# Patient Record
Sex: Male | Born: 1935 | Race: White | Hispanic: No | Smoking: Former smoker
Health system: Southern US, Community
[De-identification: ages and names within clinical notes are randomized; demographics above are authoritative.]

## PROBLEM LIST (undated history)

## (undated) DIAGNOSIS — R519 Headache, unspecified: Secondary | ICD-10-CM

## (undated) DIAGNOSIS — Z8673 Personal history of transient ischemic attack (TIA), and cerebral infarction without residual deficits: Secondary | ICD-10-CM

## (undated) DIAGNOSIS — F329 Major depressive disorder, single episode, unspecified: Secondary | ICD-10-CM

## (undated) DIAGNOSIS — I2510 Atherosclerotic heart disease of native coronary artery without angina pectoris: Secondary | ICD-10-CM

## (undated) DIAGNOSIS — H3530 Unspecified macular degeneration: Secondary | ICD-10-CM

## (undated) DIAGNOSIS — G309 Alzheimer's disease, unspecified: Secondary | ICD-10-CM

## (undated) DIAGNOSIS — M1990 Unspecified osteoarthritis, unspecified site: Secondary | ICD-10-CM

## (undated) DIAGNOSIS — I219 Acute myocardial infarction, unspecified: Secondary | ICD-10-CM

## (undated) DIAGNOSIS — I209 Angina pectoris, unspecified: Secondary | ICD-10-CM

## (undated) DIAGNOSIS — I672 Cerebral atherosclerosis: Secondary | ICD-10-CM

## (undated) DIAGNOSIS — N183 Chronic kidney disease, stage 3 unspecified: Secondary | ICD-10-CM

## (undated) DIAGNOSIS — R569 Unspecified convulsions: Secondary | ICD-10-CM

## (undated) DIAGNOSIS — G459 Transient cerebral ischemic attack, unspecified: Secondary | ICD-10-CM

## (undated) DIAGNOSIS — F32A Depression, unspecified: Secondary | ICD-10-CM

## (undated) DIAGNOSIS — I10 Essential (primary) hypertension: Secondary | ICD-10-CM

## (undated) DIAGNOSIS — R51 Headache: Secondary | ICD-10-CM

## (undated) DIAGNOSIS — E785 Hyperlipidemia, unspecified: Secondary | ICD-10-CM

## (undated) DIAGNOSIS — F0280 Dementia in other diseases classified elsewhere without behavioral disturbance: Secondary | ICD-10-CM

## (undated) HISTORY — PX: APPENDECTOMY: SHX54

## (undated) HISTORY — PX: CHOLECYSTECTOMY: SHX55

## (undated) HISTORY — PX: CARDIAC CATHETERIZATION: SHX172

## (undated) HISTORY — PX: CORONARY ANGIOPLASTY: SHX604

## (undated) HISTORY — DX: Unspecified osteoarthritis, unspecified site: M19.90

## (undated) HISTORY — DX: Unspecified macular degeneration: H35.30

## (undated) HISTORY — DX: Acute myocardial infarction, unspecified: I21.9

## (undated) HISTORY — DX: Hyperlipidemia, unspecified: E78.5

---

## 2001-06-19 ENCOUNTER — Inpatient Hospital Stay (HOSPITAL_COMMUNITY): Admission: EM | Admit: 2001-06-19 | Discharge: 2001-06-28 | Payer: Self-pay | Admitting: Emergency Medicine

## 2001-06-19 ENCOUNTER — Encounter: Payer: Self-pay | Admitting: Emergency Medicine

## 2001-06-21 ENCOUNTER — Encounter: Payer: Self-pay | Admitting: Emergency Medicine

## 2003-01-06 ENCOUNTER — Emergency Department (HOSPITAL_COMMUNITY): Admission: EM | Admit: 2003-01-06 | Discharge: 2003-01-06 | Payer: Self-pay | Admitting: Emergency Medicine

## 2003-07-01 ENCOUNTER — Emergency Department (HOSPITAL_COMMUNITY): Admission: EM | Admit: 2003-07-01 | Discharge: 2003-07-01 | Payer: Self-pay | Admitting: Emergency Medicine

## 2004-05-06 ENCOUNTER — Inpatient Hospital Stay (HOSPITAL_COMMUNITY): Admission: EM | Admit: 2004-05-06 | Discharge: 2004-05-11 | Payer: Self-pay | Admitting: Emergency Medicine

## 2004-05-06 ENCOUNTER — Ambulatory Visit: Payer: Self-pay | Admitting: Internal Medicine

## 2004-05-08 ENCOUNTER — Ambulatory Visit: Payer: Self-pay | Admitting: Internal Medicine

## 2004-05-08 ENCOUNTER — Ambulatory Visit: Payer: Self-pay | Admitting: *Deleted

## 2004-09-21 ENCOUNTER — Ambulatory Visit (HOSPITAL_COMMUNITY): Admission: RE | Admit: 2004-09-21 | Discharge: 2004-09-21 | Payer: Self-pay | Admitting: Family Medicine

## 2009-01-27 ENCOUNTER — Inpatient Hospital Stay (HOSPITAL_COMMUNITY): Admission: EM | Admit: 2009-01-27 | Discharge: 2009-01-28 | Payer: Self-pay | Admitting: Cardiology

## 2009-01-27 ENCOUNTER — Encounter: Payer: Self-pay | Admitting: Emergency Medicine

## 2009-01-27 ENCOUNTER — Ambulatory Visit: Payer: Self-pay | Admitting: Cardiovascular Disease

## 2009-02-16 DIAGNOSIS — I10 Essential (primary) hypertension: Secondary | ICD-10-CM

## 2009-02-16 DIAGNOSIS — I219 Acute myocardial infarction, unspecified: Secondary | ICD-10-CM | POA: Insufficient documentation

## 2009-02-16 DIAGNOSIS — M159 Polyosteoarthritis, unspecified: Secondary | ICD-10-CM

## 2009-02-16 DIAGNOSIS — I2510 Atherosclerotic heart disease of native coronary artery without angina pectoris: Secondary | ICD-10-CM

## 2009-02-16 DIAGNOSIS — E785 Hyperlipidemia, unspecified: Secondary | ICD-10-CM

## 2009-02-16 DIAGNOSIS — H3530 Unspecified macular degeneration: Secondary | ICD-10-CM

## 2009-02-28 ENCOUNTER — Encounter: Payer: Self-pay | Admitting: Cardiology

## 2009-03-01 ENCOUNTER — Ambulatory Visit: Payer: Self-pay | Admitting: Cardiology

## 2009-07-27 ENCOUNTER — Ambulatory Visit: Payer: Self-pay | Admitting: Cardiology

## 2010-05-23 NOTE — Assessment & Plan Note (Signed)
Summary: rov   Visit Type:  Follow-up Primary Provider:  Dr Ike Bene in [place]  CC:  no complaints pt played golf yesterday.  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 Her metal stent placed the cervical spine 2006. In 2010 he had Cutting Balloon and classic for in-stent restenosis. At that time he had total occlusion of a small right and diffuse LAD disease.  He is had no recent chest pain shortness breath or palpitations.  His other problems include hypertension, hyperlipidemia, and macular degeneration. He's been intolerant of statins.  He knows Dr. Myrtis Ser from when we used to be over on [facility] and will arrange for him to see Dr. Myrtis Ser in [place] in one year since be retiring at the end of the year. He and his wife live to [place] in [place] in a small town in [place].  Current Medications (verified): 1)  Lopressor 50 Mg Tabs (Metoprolol Tartrate) .... 1/2 Tablet Two Times A Day 2)  Red Yeast Rice 600 Mg Tabs (Red Yeast Rice Extract) .... Take 1 Tablet By Mouth Two Times A Day 3)  Aspirin Ec 325 Mg Tbec (Aspirin) .... Take One Tablet By Mouth Daily 4)  Garlic Oil 1000 Mg Caps (Garlic) .... Take 2 Capsules Two Times A Day 5)  Vitamin C 500 Mg  Tabs (Ascorbic Acid) .... One or 2 Tablets A Day 6)  Vitamin E 400 Unit Caps (Vitamin E) .... Take 1 Capsule By Mouth Once A Day 7)  Glucosamine 500 Mg Caps (Glucosamine Sulfate) .... Take 1 Capsule By Mouth Two Times A Day 8)  Nitrostat 0.4 Mg Subl (Nitroglycerin) .Marland Kitchen.. 1 Tablet Under Tongue At Onset of Chest Pain; You May Repeat Every 5 Minutes For Up To 3 Doses. 9)  Co Q-10 100mg   Caps (Coenzyme Q10) .Marland Kitchen.. 1 Tab  Two Times A Day  Allergies (verified): 1)  ! Lipitor (Atorvastatin) 2)  ! Zocor 3)  ! * Slow Acting Nitroglycerine  Past History:  Past Medical History: Last updated: 02/16/2009 Current Problems:  HYPERLIPIDEMIA, MIXED (ICD-272.2) HYPERTENSION  (ICD-401.9) MI (ICD-410.90) CAD (ICD-414.00) DEGENERATIVE JOINT DISEASE, GENERALIZED (ICD-715.00) MACULAR DEGENERATION (ICD-362.50)    Vital Signs:  Patient profile:   76 year old male Height:      69 inches Weight:      230 pounds BMI:     34.09 Pulse rate:   54 / minute BP sitting:   111 / 67  (left arm) Cuff size:   large  Vitals Entered By: Burnett Kanaris, CNA (July 27, 2009 3:19 PM)   Other Orders: EKG w/ Interpretation (93000)  Patient Instructions: 1)  Your physician wants you to follow-up in: 1 year with Dr. Myrtis Ser in [place].  You will receive a reminder letter in the mail two months in advance. If you don't receive a letter, please call our office to schedule the follow-up appointment.  Appended Document: rov Assessement: 1.  CAD:  Stable. Continue curent Rx 2.  HTN:  Stable.  Continue current Rx 3.  HL:  Intolerant to statins.  Continue HH diet.  BB

## 2010-07-27 LAB — CBC
HCT: 35.8 % — ABNORMAL LOW (ref 39.0–52.0)
HCT: 39.8 % (ref 39.0–52.0)
Hemoglobin: 12.8 g/dL — ABNORMAL LOW (ref 13.0–17.0)
MCHC: 33.9 g/dL (ref 30.0–36.0)
MCV: 92.5 fL (ref 78.0–100.0)
Platelets: 164 10*3/uL (ref 150–400)
RBC: 4.35 MIL/uL (ref 4.22–5.81)
RDW: 13.4 % (ref 11.5–15.5)
RDW: 13.7 % (ref 11.5–15.5)

## 2010-07-27 LAB — COMPREHENSIVE METABOLIC PANEL
ALT: 18 U/L (ref 0–53)
Alkaline Phosphatase: 48 U/L (ref 39–117)
BUN: 28 mg/dL — ABNORMAL HIGH (ref 6–23)
CO2: 33 mEq/L — ABNORMAL HIGH (ref 19–32)
Chloride: 103 mEq/L (ref 96–112)
GFR calc non Af Amer: 50 mL/min — ABNORMAL LOW (ref >60)
Glucose, Bld: 143 mg/dL — ABNORMAL HIGH (ref 70–99)
Potassium: 4.1 mEq/L (ref 3.5–5.1)
Sodium: 140 mEq/L (ref 135–145)
Total Bilirubin: 0.5 mg/dL (ref 0.3–1.2)
Total Protein: 7 g/dL (ref 6.0–8.3)

## 2010-07-27 LAB — BASIC METABOLIC PANEL
BUN: 22 mg/dL (ref 6–23)
Chloride: 102 mEq/L (ref 96–112)
Chloride: 104 mEq/L (ref 96–112)
GFR calc non Af Amer: 46 mL/min — ABNORMAL LOW (ref >60)
Glucose, Bld: 117 mg/dL — ABNORMAL HIGH (ref 70–99)
Glucose, Bld: 121 mg/dL — ABNORMAL HIGH (ref 70–99)
Potassium: 4.1 mEq/L (ref 3.5–5.1)
Potassium: 5.7 mEq/L — ABNORMAL HIGH (ref 3.5–5.1)
Sodium: 137 mEq/L (ref 135–145)

## 2010-07-27 LAB — CARDIAC PANEL(CRET KIN+CKTOT+MB+TROPI)
CK, MB: 15.9 ng/mL — ABNORMAL HIGH (ref 0.3–4.0)
Relative Index: 12.8 — ABNORMAL HIGH (ref 0.0–2.5)
Relative Index: 16.1 — ABNORMAL HIGH (ref 0.0–2.5)
Total CK: 173 U/L (ref 7–232)
Troponin I: 1.06 ng/mL (ref 0.00–0.06)
Troponin I: 1.93 ng/mL (ref 0.00–0.06)
Troponin I: 2.76 ng/mL (ref 0.00–0.06)

## 2010-07-27 LAB — PROTIME-INR: INR: 1.04 (ref 0.00–1.49)

## 2010-07-27 LAB — HEPARIN LEVEL (UNFRACTIONATED): Heparin Unfractionated: 0.62 IU/mL (ref 0.30–0.70)

## 2010-07-27 LAB — DIFFERENTIAL
Basophils Absolute: 0.1 10*3/uL (ref 0.0–0.1)
Basophils Relative: 1 % (ref 0–1)
Eosinophils Absolute: 0.5 10*3/uL (ref 0.0–0.7)
Monocytes Relative: 8 % (ref 3–12)
Neutro Abs: 4.3 10*3/uL (ref 1.7–7.7)
Neutrophils Relative %: 49 % (ref 43–77)

## 2010-07-27 LAB — LIPID PANEL
Cholesterol: 188 mg/dL (ref 0–200)
Triglycerides: 75 mg/dL (ref <150)

## 2010-07-27 LAB — POCT CARDIAC MARKERS: CKMB, poc: 1.1 ng/mL (ref 1.0–8.0)

## 2010-07-27 LAB — APTT: aPTT: 24 seconds (ref 24–37)

## 2010-08-16 ENCOUNTER — Encounter: Payer: Self-pay | Admitting: Cardiovascular Disease

## 2010-08-17 ENCOUNTER — Encounter: Payer: Self-pay | Admitting: Cardiovascular Disease

## 2010-08-17 ENCOUNTER — Ambulatory Visit (INDEPENDENT_AMBULATORY_CARE_PROVIDER_SITE_OTHER): Payer: Medicare Other | Admitting: Cardiovascular Disease

## 2010-08-17 VITALS — BP 154/90 | HR 73 | Resp 18 | Ht 69.0 in | Wt 226.1 lb

## 2010-08-17 DIAGNOSIS — I10 Essential (primary) hypertension: Secondary | ICD-10-CM

## 2010-08-17 DIAGNOSIS — I2510 Atherosclerotic heart disease of native coronary artery without angina pectoris: Secondary | ICD-10-CM

## 2010-08-17 DIAGNOSIS — E782 Mixed hyperlipidemia: Secondary | ICD-10-CM

## 2010-08-17 MED ORDER — METOPROLOL TARTRATE 50 MG PO TABS: ORAL_TABLET | ORAL | Status: DC

## 2010-08-17 NOTE — Patient Instructions (Signed)
Your physician wants you to follow-up in: 1 year  You will receive a reminder letter in the mail two months in advance. If you don't receive a letter, please call our office to schedule the follow-up appointment.  Your physician recommends that you continue on your current medications as directed. Please refer to the Current Medication list given to you today.  

## 2010-08-17 NOTE — Assessment & Plan Note (Signed)
Blood pressure is elevated today. He did not take his morning beta blocker dose. We'll continue observation and asked him to record some home blood pressures.

## 2010-08-17 NOTE — Assessment & Plan Note (Signed)
The patient is stable without angina. We'll continue his current medical program. Note that his blood pressure is elevated today but he did not take his morning dose of Lopressor.

## 2010-08-17 NOTE — Progress Notes (Signed)
HPI:  This is a 77 year old gentleman with coronary artery disease presented for follow up evaluation. He has been followed by Dr. Dickie La and is establishing with me and Dr. Marian Sorrow retirement. The patient last had Cutting Balloon angioplasty performed for in-stent restenosis in 2010. At that time he presented with unstable angina. He has had no further ischemic symptoms since then. His physical activity is limited by low back problems and arthritis.  He denies chest pain, dyspnea, orthopnea, PND, or edema.  Outpatient Encounter Prescriptions as of 08/17/2010  Medication Sig Dispense Refill  . Ascorbic Acid (VITAMIN C) 500 MG tablet Take 500 mg by mouth daily.        Marland Kitchen aspirin 81 MG tablet Take 81 mg by mouth daily.        . Coenzyme Q10 (COQ10) 100 MG CAPS Take 1 capsule by mouth 2 (two) times daily.        . metoprolol (LOPRESSOR) 50 MG tablet 1/2 tab po bid       . nitroGLYCERIN (NITROSTAT) 0.4 MG SL tablet Place 0.4 mg under the tongue every 5 (five) minutes as needed.        . Red Yeast Rice 600 MG CAPS Take 1 capsule by mouth 2 (two) times daily.        . vitamin E 400 UNIT capsule Take 400 Units by mouth daily.        Marland Kitchen DISCONTD: aspirin 325 MG tablet Take 325 mg by mouth daily.          Allergies  Allergen Reactions  . Atorvastatin   . Simvastatin     Past Medical History  Diagnosis Date  . Hyperlipidemia   . Myocardial infarction   . CAD (coronary artery disease)   . DJD (degenerative joint disease)   . Macular degeneration     ROS: Negative except as per HPI  BP 154/90  Pulse 73  Resp 18  Ht 5\' 9"  (1.753 m)  Wt 226 lb 1.9 oz (102.567 kg)  BMI 33.39 kg/m2  PHYSICAL EXAM: Pt is alert and oriented, elderly, obese gentleman in NAD HEENT: normal Neck: JVP - normal, carotids 2+= without bruits Lungs: CTA bilaterally CV: RRR without murmur or gallop Abd: soft, NT, Positive BS, no hepatomegaly Ext: no C/C/E, distal pulses intact and equal Skin: warm/dry no rash  EKG:   Normal sinus rhythm with sinus arrhythmia, first degree AV block, otherwise within normal limits.  ASSESSMENT AND PLAN:

## 2010-08-17 NOTE — Assessment & Plan Note (Signed)
The patient is statin intolerant he has tried multiple agents. He will continue with red yeast rice.

## 2010-09-08 NOTE — Cardiovascular Report (Signed)
[place]. [facility]  Patient:    David Collier, David Collier Visit Number: [id number] MRN: [id number]          Service Type: MED Location: CCUB 2908 01 Attending Physician:  Lenoria Farrier Dictated by:   Rollene Rotunda, M.D. [facility] Proc. Date: 06/20/01 Admit Date:  06/20/2001   CC:         Bruce R. Juanda Chance, M.D. [facility]   Cardiac Catheterization  PRIMARY PHYSICIAN: None.  CARDIOLOGISTEverardo Beals Juanda Chance, M.D.  INDICATIONS: Evaluate patient with unstable angina.  DESCRIPTION OF PROCEDURE: Left heart catheterization was performed via the right femoral artery.  The artery was cannulated using an anterior wall puncture.  A #6 French arterial sheath was inserted via the modified Seldinger technique.  Preformed Judkins and a pigtail catheter were utilized.  The patient tolerated the procedure well and left the lab in stable condition.  RESULTS:  HEMODYNAMICS: LV 139/33, AO 143/87.  CORONARY ARTERIOGRAPHY: Left main: The left main coronary artery was normal.  Left anterior descending: The LAD had a long proximal 60% stenosis involving septal perforators. There were diffuse luminal irregularities throughout the remainder of this vessel. The first septal perforator had an ostial 90% stenosis. The second septal perforator had an ostial 90%.  Circumflex: The circumflex was a dominant vessel. In the AV groove there were diffuse luminal irregularities. There was a distal, long 80% stenosis before posterolateral. It was occluded after posterolateral. There appeared to be LAD to left collaterals. There was a large mid obtuse marginal off the circumflex which had a proximal 40% stenosis followed by a long mid 70% stenosis, followed by a more focal 90% stenosis in the mid segment.  Right coronary artery: The right coronary artery appeared to be nondominant and it was occluded proximally and recanalization with bridging collaterals.  LEFT VENTRICULOGRAM: A left  ventriculogram was obtained in the RAO projection.  The EF was 65% with normal wall motion.  PLAN: I reviewed the films with Drs. Pulsipher and Brodie. We plan to suggest PCI per Dr. Chales Abrahams on Monday with possible stenting and angioplasty of the mid obtuse marginal lesion. Dictated by:   Rollene Rotunda, M.D. [facility] Attending Physician:  Lenoria Farrier DD:  06/20/01 TD:  06/23/01 Job: [id number]

## 2010-09-08 NOTE — Cardiovascular Report (Signed)
[place]. [facility]  Patient:    David Collier, David Collier Visit Number: [id number] MRN: [id number]          Service Type: MED Location: CCUB 2908 01 Attending Physician:  Lenoria Farrier Dictated by:   Everardo Beals Juanda Chance, M.D. [facility] Proc. Date: 06/26/01 Admit Date:  06/20/2001   CC:         Luis Abed, M.D. [facility]  Cardiopulmonary Laboratory   Cardiac Catheterization  PROCEDURES PERFORMED: Percutaneous coronary intervention.  CLINICAL HISTORY: The patient is 77 years old and had remote PTCAs in the 22s. He is admitted with prolonged chest pain and enzymes consistent with a non-Q-wave infarction. He developed nausea and vomiting and a Mallory-Weiss tear, and we had to postpone his intervention until today. We plan to use Angiomax to decrease the bleeding risks.  DESCRIPTION OF PROCEDURE: The procedure was performed via the left femoral artery using an arterial sheath and 6 French preformed coronary catheters.  A front wall arterial puncture was performed and Omnipaque contrast was used. We used a 7 [place] 3.5 Voda guiding catheter with side holes and a short luge wire. The patient was given Angiomax bolus and infusion which prolonged his ACT to greater than 300 seconds. We were able to navigate the wire down the circumflex marginal vessel without difficulty. We first dilated with a 2.5 x 30 mm Maverick performing four inflations up to 8 atmospheres for 28 seconds. We were hoping initially to get by with a short stent, but we got suboptimal results and was felt we had to stent the entire length of the lesion, so we first went in with a 2.5 x 24 mm Express and deployed this in the distal lesion in the circumflex marginal vessel with two inflations up to 12 atmospheres for 23 seconds.  We then deployed at second 2.6 x 16 mm Express stent overlapping the first stent and deploying this with two inflations of 12 and 15 atmospheres for 35 and 23 seconds.  We  then approached the distal circumflex artery which fed a posterior descending branch. We initially pre-dilated this with the 2.5 Express performing two inflations of 4 and 5 atmospheres for 42 and 24 seconds. We then deployed at 2.25 x 20 mm Express with three inflations of 8, 14 and 16 atmospheres for 30 seconds each. Repeat diagnostic studies were then performed through the guiding catheter. The patient tolerated the procedure well and left the laboratory in satisfactory condition. RESULTS: Initially, the marginal branch had segmental disease in its mid to distal portion with two focal areas of 80 and 95% stenosis. With stenting, the 95% stenosis improved to 0% and with the second overlapping stent the 80% stenosis improved to 0%. There was residual 40% disease proximal to the two overlapping stents.  The lesion in the circumflex artery was initially 80% and following stenting this improved to 0%.  CONCLUSIONS: 1. Successful stenting of tandem lesions in the circumflex marginal vessel    with improvement in percent diameter narrowing with tandem overlying    stents in the distal lesion from 95% to 0% and in the mid lesion    from 80% to 0%. 2. Successful stenting of the distal circumflex artery with improvement in    percent diameter narrowing from 80% to 0%.  DISPOSITION: The patient was returned to the postangioplasty unit for further observation. Because of he will be at high risk for re-stenosis we will start him on Foltx. Dictated by:   Everardo Beals Juanda Chance,  M.D. [facility] Attending Physician:  Lenoria Farrier DD:  06/26/01 TD:  06/27/01 Job: [id number]

## 2010-09-08 NOTE — Discharge Summary (Signed)
[place]. [facility]  Patient:    David Collier, David Collier Visit Number: [id number] MRN: [id number]          Service Type: MED Location: 2000 2001 01 Attending Physician:  Lenoria Farrier Dictated by:   Guy Franco, P.A. Admit Date:  06/20/2001 Discharge Date: 06/28/2001                    Referring Physician Discharge [place]  DATE OF BIRTH:  27-Jan-1929  DISCHARGE DIAGNOSES: 1. Non-Q-wave myocardial infarction status post stent placement to the    circumflex-obtuse marginal x2, distal circumflex x1, on June 26, 2001 by    Dr. Charlies Constable. 2. Upper gastrointestinal bleed with Clayborne Artist tear, treated. 3. Leukocytosis, resolved. 4. Hypertension, treated.  HOSPITAL COURSE:  David Collier is a 77 year old male patient who was transferred to [facility] on June 20, 2001 for a cardiac workup.  He presented to [facility] with acute coronary syndrome and was transferred here for cardiac catheterization.  He does have known coronary artery disease and underwent a prior PCI in the 1980s (site unknown).  His cardiac isoenzymes did peak at CK of 278, MB fraction 56.2, and troponin at 5.08.  Other lab studies showed hemoglobin 9.4, hematocrit 27.8, platelets 228.  Sodium 138, potassium 4.3, BUN 17, creatinine 1.4.  The patient was then taken to cardiac catheterization laboratory.  He was found to have normal left main, LAD with a long proximal 60% lesion, septal one with a 90% ostial, septal two with a 90% ostial.  The patient was circumflex dominant with a distal long 80% lesion before the PLA.  This was occluded just after the PL.  Obtuse marginal was a large vessel with a mid 70% long lesion followed by a focal 90% stenosis.  RCA nondominant with a proximal 100% lesion with bridging collaterals.  Eventually, the patient did undergo a PCI/stent placement to the circumflex/OM for 100% to a 0% stenosis.  The patient then underwent a PCI of the  circumflex (distal lesion) from an 80% to a 0% stenosis post procedure.  The patient remained stable overnight and was eventually discharged to home on June 28, 2001.  Other issues during his hospitalization include guaiac positive stool with some anemia.  The GI service did see the patient and the patient underwent an EGD which revealed a Mallory Weiss tear which was felt to be secondary to the patients nausea, vomiting, and treatment with anticoagulants.  Dr. Russella Dar performed the procedure and felt that the patient should recover within 10 days.  Otherwise, the patient did remain stable.  His hemoglobins did stabilize prior to his discharge.  DISCHARGE MEDICATIONS:  He was discharged home on the following medications: 1. Lipitor 10 mg one p.o. q.d. 2. Lopressor 50 mg one p.o. b.i.d. 3. Plavix 75 mg one p.o. q.d. 4. Enteric-coated aspirin 325 mg one p.o. q.d. 5. Foltx one p.o. q.d. 6. Protonix 40 mg one p.o. b.i.d. 7. Sublingual nitroglycerin p.r.n. chest pain.  ACTIVITY:   No strenuous activity.  DIET:  Low fat diet.  WOUND CARE:  Clean over catheterization site with soap and water.  FOLLOW-UP:  Call for any concerns or questions.  Follow up with Dr. Dietrich Pates in [place] in two weeks.  The office will call with an appointment.  At that time the patient will need a CBC to ensure that his hemoglobin is trending upward. Dictated by:   Guy Franco, P.A. Attending Physician:  Charlies Constable  Aundria Rud DD:  07/09/01 TD:  07/09/01 Job: [id number]

## 2010-09-08 NOTE — H&P (Signed)
[facility]  Patient:    David Collier, David Collier Visit Number: [id number] MRN: [id number]          Service Type: MED Location: ICCU [id number] Attending Physician:  Hilario Quarry Dictated by:   Colette Ribas, M.D. Admit Date:  06/19/2001                           History and Physical  ADMITTING PHYSICIAN:  Colette Ribas, M.D.  ATTENDING PHYSICIAN:  Colette Ribas, M.D.  PRIMARY PHYSICIAN:  [facility]  PRIMARY CARDIOLOGIST:  Valera Castle, M.D.  HISTORY OF PRESENT ILLNESS:  A 77 year old gentleman with known coronary artery disease who has been noncompliant with medications who has not had a cardiac evaluation in 12 years who presented with increasing left-sided chest pain.  He has had it for the last few weeks and then the day of admission he had left-sided chest pain 8/10 which was quite heavy, radiating to the shoulder.  Some nausea and mild shortness of breath.  No diaphoresis.  Came to the emergency department with near complete resolution of pain with two nitroglycerin.  Was placed on nitroglycerin drip with resolution of pain.  No respiratory complaints.  The patient did have angioplasty around 10-12 years ago, he states.  He had quit taking his antihypertensives except vitamin E and garlic as this is what he had wanted to do.  ECG initially showed diffuse ST abnormalities with depression in the pericordial leads which resolved with nitroglycerin drip as the patient became chest pain free.  PAST MEDICAL HISTORY:  Coronary artery disease, hypertension.  PAST SURGICAL HISTORY:  Appendectomy.  MEDICATIONS:  Vitamin E and nothing else.  ALLERGIES:  SORBITOL.  PHYSICAL EXAMINATION  VITAL SIGNS:  Temperature 97.8, O2 saturation 98% room air, pulse 90, respirations 22.  Blood pressure initially when he came to the ER was 218/102 which normalized quite well with the nitroglycerin drip.  GENERAL:  When I saw the patient he was  pleasant, talkative, in no acute distress.  HEENT:  Normocephalic, atraumatic.  Pupils are equal, round, and reactive to light.  Extraocular movements are intact.  Naso and oropharynx with moist mucous membranes.  NECK:  No JVD.  CHEST:  Clear to auscultation bilaterally.  CARDIAC:  Regular rate and rhythm.  Normal S1, S2.  No murmurs, gallops, rubs.  ABDOMEN:  Bowel sounds positive, soft, nontender.  EXTREMITIES:  No cyanosis, clubbing, erythema, edema.  LABORATORIES:  CPK 93, CK-MB 2.7, troponin 0.19.  Sodium 140, potassium 4.3, BUN 14, creatinine 1.2.  The rest of the chem-12 is normal.  WBC 9.4, hematocrit 41.7, platelets 249,000.  PTT 23, PT 14.4.  ECG as stated above.  Chest x-ray reportedly negative per the emergency department physician.  ASSESSMENT:  A 77 year old gentleman with unstable angina pectoris off of all medications along with a history of hypertension who was to be admitted at [facility] but there were no beds available.  Dr. Daleen Squibb was consulted and it was decided to keep the patient at [facility] overnight until a bed became available.  PLAN:  Admit to [facility] in ICU overnight.  Check CPKs, troponins, MB fractions x3.  Continue nitroglycerin drip.  Continue aspirin q.d.  When patient is stable and bed available, will transfer.  ADDENDUM:  Patient was nearly chest pain free in the morning.  One of the only symptomatology was headache which seemed to improve after morphine.  We discussed obtaining  a head CT due to the anticoagulation but the head CT scanner was down here at [facility].  This was discussed with the cardiologist at [facility] and therefore we will hasten the transfer to [facility].  Will continue rule out protocol there. Dictated by:   Colette Ribas, M.D. Attending Physician:  Hilario Quarry DD:  06/20/01 TD:  06/20/01 Job: [id number]

## 2010-09-08 NOTE — Op Note (Signed)
David Collier, David Collier              ACCOUNT NO.:  [id number]   MEDICAL RECORD NO.:  [id number]          PATIENT TYPE:  INP   LOCATION:  A216                          FACILITY:  [facility]   PHYSICIAN:  Dalia Heading, M.D.  DATE OF BIRTH:  07-25-1928   DATE OF PROCEDURE:  05/10/2004  DATE OF DISCHARGE:                                 OPERATIVE REPORT   PREOPERATIVE DIAGNOSIS:  Cholecystitis, cholelithiasis.   POSTOPERATIVE DIAGNOSIS:  Cholecystitis, cholelithiasis.   PROCEDURE:  Laparoscopic cholecystectomy.   SURGEON:  Dr. Franky Macho.   ASSISTANT:  Bernerd Limbo. Leona Carry, M.D.   ANESTHESIA:  General endotracheal.   INDICATIONS:  The patient is a 77 year old white male presented with  cholecystitis secondary cholelithiasis. Preoperative cardiac workup  including a stress test was negative. The patient comes to the operating for  laparoscopic cholecystectomy.  The risks and benefits of the procedure  including bleeding, infection, hepatobiliary injury, and the possibility of  an open procedure were fully explained to the patient who gave informed  consent.   PROCEDURE NOTE:  The patient was placed in supine position. After induction  of general endotracheal anesthesia, the abdomen was prepped and draped using  the usual sterile technique with Betadine. Surgical site confirmation was  performed.   A supraumbilical incision was made down to the fascia. Veress needle was  introduced into the abdominal cavity and confirmation of placement was done  using the saline drop test. The abdomen was then insufflated to 16 mmHg  pressure. An 11 mm trocar was introduced into the abdominal cavity under  direct visualization without difficulty. The patient was placed in reversed  Trendelenburg position. An additional 11-mm trocar was placed epigastric  region and 5 mm trocars placed in the right upper quadrant and right flank  regions. The liver was inspected, noted to be within normal limits. The  gallbladder was retracted superior and laterally.  The dissection was begun  around the infundibulum of the gallbladder. The cystic duct was first  identified. Its juncture to the infundibulum fully identified.  Endoclips  were placed proximally and distally on the cystic duct and cystic duct was  divided. This was likewise done to the cystic artery. The gallbladder is  then freed away from the gallbladder fossa using Bovie electrocautery. The  gallbladder delivered through the epigastric trocar site using EndoCatch  bag.  The gallbladder fossa was inspected.  Any bleeding was controlled  using Bovie electrocautery. Surgicel was placed in the gallbladder fossa.  All fluid nerve and evacuated from the abdominal cavity prior to removal of  the trocars.   All wounds were irrigated with normal saline. All wounds were injected with  0.5% Sensorcaine. The supraumbilical fascia as well as epigastric fascia  reapproximated using a 0 Vicryl interrupted suture. All skin incisions were  closed using staples. Betadine ointment, dry sterile dressings were applied.   All tape and needle counts were correct at the end of the procedure. The  patient was extubated in the upper room was went back to recovery room awake  in stable condition.   COMPLICATIONS:  None.  SPECIMEN:  Gallbladder with stones.   BLOOD LOSS:  MinimalLoraine Leriche   MAJ/MEDQ  D:  05/10/2004  T:  05/10/2004  Job:  [id number]   cc:   Patrica Duel, M.D.  [address], [address]  [place]  [place] [address]  Fax: [phone]

## 2010-09-08 NOTE — H&P (Signed)
David Collier, David Collier              ACCOUNT NO.:  [id number]   MEDICAL RECORD NO.:  [id number]          PATIENT TYPE:  INP   LOCATION:  A302                          FACILITY:  [facility]   PHYSICIAN:  Patrica Duel, M.D.    DATE OF BIRTH:  [birth year]   DATE OF ADMISSION:  05/06/2004  DATE OF DISCHARGE:  LH                                HISTORY & PHYSICAL   CHIEF COMPLAINT:  Abdominal pain.   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  He has undergone stenting on two occasions, most recently in 2003 by Dr.  Antoine Poche.  He has done very well from a cardiac standpoint and has had no  recurrent symptoms suggestive of ischemia.   The patient presented to the emergency department with the relatively sudden  onset of epigastric abdominal pain and bloating.  The pain was quite severe  (9/10) and intermittent in nature.  He had no nausea and vomiting, diarrhea,  melena, hematemesis, or hematochezia.  He was administered enemas x2 by his  wife, which were productive but offered no relief of his abdominal pain.   The patient presented to the emergency department.  Cardiac enzymes and an  EKG are benign.  A CT of the abdomen and pelvis is essentially unrevealing.  Lab review revealed a white count of 12,000 with a left shift.  Hemoglobin  and hematocrit are stable.  Electrolytes normal.  Currently liver functions  are pending.   There is no history of headache, neurologic deficits, chest pain, or  dyspnea, syncope, palpitations, or diaphoresis.  There is also no history of  genitourinary symptoms.   The patient is admitted with abdominal pain of questionable etiology.   CURRENT MEDICATIONS:  Include:  1.  Lopressor 50 b.i.d.  2.  Aspirin 325 daily.   ALLERGIES:  SORBITRATE.   PAST MEDICAL HISTORY:  As noted above.   SOCIAL HISTORY:  He is a nondrinker and nonsmoker.  He has a  supportive  family.   REVIEW OF SYSTEMS:  Negative except as mentioned.   FAMILY HISTORY:  Noncontributory.   PHYSICAL EXAMINATION:  GENERAL:  A very pleasant fully alert male who is  alert and oriented in no acute distress at this time.  VITAL SIGNS:  Temperature 97.6, BP 142/70, heart rate is 60 and regular,  respirations 20-22 and unlabored.  HEENT:  Normocephalic atraumatic.  Pupils are equal.  There is no scleral  icterus.  Ears, nose, and throat are benign.  NECK:  Supple.  There are no masses, thyromegaly, bruits, or  lymphadenopathy.  LUNGS:  Clear.  HEART:  Sounds are somewhat distant but no apparent murmurs, rubs, or  gallops noted.  ABDOMEN:  Mildly distended and hypotympanic.  Bowel sounds are diminished  but present.  There is mild-to-moderate tenderness in the epigastrium only.  Right upper quadrant is clear.  Murphy's sign is negative.  There is no  hepatomegaly, splenomegaly, or other masses noted.  RECTAL:  Benign.  Prostate normal.  Heme-negative stool.  No masses.  GENITALIA:  Normal.  No hernia apparent.  EXTREMITIES:  Femoral pulses are normal.  No cyanosis, clubbing, or edema.  Peripheral pulses are perfectly normal.  NEUROLOGIC:  Without focal deficits.   LABORATORIES:  Pertinent labs as noted above.   ASSESSMENT:  Abdominal pain.  No clear etiology is apparent at this time.  We need to consider obstruction, ischemic bowel, atypical gallbladder  disease (liver function tests pending), gastritis, peptic ulcer disease,  ileus, or other.   PLAN:  Admit to 2A for monitoring.  Administer Protonix and Cipro  empirically.  Pain control with morphine sulfate.  We will obtain a  gastroenterology consult.  Possibly an ultrasound hepatobiliary scan pending  the LFTs.  We will follow and treat expectantly.      MC/MEDQ  D:  05/06/2004  T:  05/06/2004  Job:  [id number]

## 2010-09-08 NOTE — Cardiovascular Report (Signed)
[place]. [facility]  Patient:    David Collier, David Collier Visit Number: [id number] MRN: [id number]          Service Type: MED Location: CCUB [id number] [id number] Attending Physician:  Lenoria Farrier Dictated by:   Rollene Rotunda, M.D. [facility] Proc. Date: 06/20/01 Admit Date:  06/20/2001                          Cardiac Catheterization  INCOMPLETE  1990.  At that time, he had normal left main.  The LAD had no significant obstruction.  The distal circumflex had an 80% stenosis.  The circumflex marginal had a 90% obstruction at the site of a previous angioplasty.  This was apparently treated with angioplasty again.  The right coronary artery was obstructed after the SA nodal branch.  The EF was said to be within normal limits with no segment wall motion abnormalities.  He said he had done well since that time until approximately two weeks ago when he started having anterior chest burning  This was a discomfort that would occur with activity.  He started noticing it with things such as golfing.  He has had more of this recently where he has been walking up a slight incline and would get discomfort that now radiates to his neck or into his arms.  He is now having chest discomfort at rest.  He was admitted to [facility] yesterday where he did have a small troponin bump (0.19, 0.16).  He has been treated with heparin, nitroglycerin, and aspirin.  He is now admitted in transfer.             y Dictated by:   Rollene Rotunda, M.D. [facility] Attending Physician:  Lenoria Farrier DD:  06/20/01 TD:  06/21/01 Job: [id number]

## 2010-09-08 NOTE — Discharge Summary (Signed)
David Collier, David Collier              ACCOUNT NO.:  [id number]   MEDICAL RECORD NO.:  [id number]          PATIENT TYPE:  INP   LOCATION:  A216                          FACILITY:  [facility]   PHYSICIAN:  Dalia Heading, M.D.  DATE OF BIRTH:  09/06/1928   DATE OF ADMISSION:  05/06/2004  DATE OF DISCHARGE:  01/19/2006LH                                 DISCHARGE SUMMARY   AGE:  77 years old.   HOSPITAL COURSE/SUMMARY:  Patient is a 77 year old white male who presented  to [facility] with upper abdominal pain.  He was admitted to the  hospital for further evaluation and treatment.  Gastroenterology was  consulted, and the patient was felt to have cholecystitis secondary to  cholelithiasis.  A surgery consultation was obtained as well as cardiology.  The patient ruled out for myocardial infarction.  The patient had a stress  test, which was unremarkable.  The patient subsequently underwent a  laparoscopic cholecystectomy on May 10, 2004.  He tolerated the  procedure well.  His postoperative course was for the most part  unremarkable.  He did have some mild wheezing, but a chest x-ray  postoperatively revealed stable cardiomegaly without evidence of fluid  overload.  The patient is being discharged home on postoperative day #1 in  good, improving condition.   DISCHARGE INSTRUCTIONS:  Patient is to follow up with Dr. Franky Macho on  May 18, 2004.   DISCHARGE MEDICATIONS:  1.  Vicodin 1-2 tablets p.o. q.4h. p.r.n. pain.  2.  Lopressor 12.5 mg p.o. b.i.d.  3.  Aspirin 325 mg p.o. q.d.   PRINCIPLE DIAGNOSES:  1.  Acute cholecystitis/cholelithiasis.  2.  Coronary artery disease.  3.  Hypertension.   PRINCIPLE PROCEDURE:  Laparoscopic cholecystectomy by Dr. Franky Macho on  May 10, 2004.     Mark   MAJ/MEDQ  D:  05/11/2004  T:  05/11/2004  Job:  [id number]   cc:   Patrica Duel, M.D.  [address], [address]  [place]  [place] [address]  Fax: [phone]   Vida Roller, M.D.  Fax: [phone]

## 2010-09-08 NOTE — Consult Note (Signed)
[name], [name]              ACCOUNT NO.:  [id number]   MEDICAL RECORD NO.:  [id number]          PATIENT TYPE:  INP   LOCATION:  A216                          FACILITY:  [facility]   PHYSICIAN:  Lionel December, M.D.    DATE OF BIRTH:  01-19-1929   DATE OF CONSULTATION:  DATE OF DISCHARGE:                                   CONSULTATION   REASON FOR CONSULTATION:  Epigastric pain.   HISTORY OF PRESENT ILLNESS:  David Collier is a 77 year old Caucasian male who was  admitted to Dr. Geanie Logan service yesterday morning via emergency room with  acute onset of epigastric pain.   Armani Brar states that he was in his usual state of health the night  before.  He had his usual meal and bedtime snack.  He woke up around 2 a.m.  yesterday morning.  He felt like he had gas trapped in his epigastric area.  This symptom became more intense and two hours later he was having pain and  came to the emergency room.  He was evaluated by Dr. Colon Branch.  He had routine  lab studies.  His WBC was 12.1.  He had CT angio of the abdomen which  revealed some erythematous changes to aorta, but no significant  abnormalities in the vasculature, but did have cholelithiasis.  He was  admitted, and began IV Cipro.  He came down later in the day for  abdominopelvic CT with  IV contrast.  It once again showed cholelithiasis  and distended gallbladder, but no other abnormalities.   Haruo Stepanek is now pain-free.  There is no history of similar episodes in  the past.  He denies frequent heartburn, dysphagia, melena, or rectal  bleeding.  He has had a good appetite, and has not lost any weight.  He  tells me that last March, he had a virus which lasted for a month.  He lost  several pounds, but slowly he has gained it back.  He denies chest pain or  dyspnea.  He has never had a colonoscopy in the past.  Three years ago,  while he was at [facility], he had coffee-ground emesis.  He had an  EGD which showed a  Mallory-Weiss tear according to his description.   He is presently on Cipro 4 mg IV q.12h., morphine sulfate 4 mg IV q.1,  Protonix 40 mg IV q.24h.   His usual medications at home include Lopressor 25 mg b.i.d., and baby ASA  b.i.d.   PAST MEDICAL HISTORY:  He has coronary artery disease.  He has had an MI in  the past.  He has had three coronary artery stents.  Lately, he has not had  any problems.  He has hypertension.   He has had appendectomy, tonsillectomy, circumcision, and varicose vein  stripping from his right lower extremity.   ALLERGIES:  NITRATES, makes him very sick.   FAMILY HISTORY:  Noncontributory.  Both parents lived into their 75s or 90s.  He has six siblings.  Two brothers have coronary artery disease, and have  undergone stenting.   SOCIAL HISTORY:  He  is married.  He is semi-retired.  He is a Programmer, multimedia.  He  has not smoked nor drank alcohol in several years.   PHYSICAL EXAMINATION:  A pleasant, well-developed, mildly obese, Caucasian  male who is in no acute distress.  His estimated weight is around 235  (according the patient).  He has not been weighed yet.  He is 70 inches  tall.  Pulse 90 per minute.  Blood pressure 125/62, temperature is 98.6,  earlier it was 100.1, and respirations 18.  Conjunctivae are pink.  Sclerae  are non-icteric.  Oropharyngeal mucosa is normal.  Neck without masses or  thyromegaly.  Cardiac exam is a regular rhythm.  Normal S1 and S2.  No  murmur or gallop noted.  Lungs are clear to auscultation.  Abdomen is  protuberant.  Bowel sounds are normal.  On palpation, is soft.  He has mild  tenderness below right costal margin with guarding.  The rectal exam is  deferred.  No peripheral edema or clubbing noted.   Abdominal CT films are reviewed.  He has clear stones in his gallbladder  which is distended.  CBD is not dilated.   LABORATORY DATA:  From today:  WBC 16.1.  Labs from yesterday evening:  WBC  16.1, H&H 13.7 and 40.6,  platelet count 224 k.  He had 77 neutrophils and 11  bands.  His glucose on admission was 134, bilirubin 0.6, AP 48, AST 28, ALT  18, total protein 7.2 with an albumin of 3.7, amylase was 77, lipase 35,  troponin level x 3 has been less than 0.05.   ASSESSMENT:  David Collier is a 77 year old Caucasian male who presents with acute  epigastric pain.  Workup reveals a distended gallbladder and cholelithiasis.  He does have leukocytosis and had low-grade fever earlier today.  His  symptoms are felt to be typical of acute cholecystitis or intermittent  cystic duct obstruction.  There is nothing to suggest that he has peptic  ulcer disease.   RECOMMENDATIONS:  Surgical consultation for laparoscopic chole.  If his LFTs  which are pending are elevated, he should have intraoperative cholangiogram.   I also recommended screening colonoscopy at a later date.  If he is  interested, he will let us know.   I have discussed with Dr. Nobie Putnam.  Dr. Lovell Sheehan will be consulted.   We would like to thank Dr. Nobie Putnam for the opportunity to participate in  the care of this gentleman.     Naje   NR/MEDQ  D:  05/07/2004  T:  05/07/2004  Job:  [id number]

## 2010-09-08 NOTE — Consult Note (Signed)
[name], Collier              ACCOUNT NO.:  [id number]   MEDICAL RECORD NO.:  [id number]          PATIENT TYPE:  INP   LOCATION:  A216                          FACILITY:  [facility]   PHYSICIAN:  Vida Roller, M.D.   DATE OF BIRTH:  May 05, 1928   DATE OF CONSULTATION:  05/08/2004  DATE OF DISCHARGE:                                   CONSULTATION   PRIMARY CARE PHYSICIAN:  Dr. Nobie Putnam.   CARDIOLOGIST:  Dr. [place] Bing   HISTORY OF PRESENT ILLNESS:  [name] is a 77 year old man, who has  coronary artery disease, status post percutaneous revascularization of his  circumflex coronary artery in 2003.  He presents to the hospital with  abdominal pain and was found to have cholelithiasis, and the recommendation  was for a cholecystectomy.  The plan was to do his surgery today; however,  last night he developed an episode of chest discomfort which was pretty  consistent with his previous angina.  No shortness of breath, no nausea, no  diaphoresis.  It was relieved with one 4 mg IV dose of morphine.  He has not  had any chest discomfort since his percutaneous revascularization.  Interestingly, he was previously on Lopressor as an outpatient and here in  the hospital, he is not on Lopressor.  Appears to be a Lopressor withdrawal  issue.  He is currently pain free now, denies any PND or orthopnea, is  actually a relatively vigorous guy since his revascularization.   PAST MEDICAL HISTORY:  1.  Coronary disease.  He had an angioplasty back in the 1980s.  Was studies      in February 2003, found to have a 60% proximal LAD with a first septal      perforator that was significantly diseased and a second septal      perforator that was also significantly diseased.  He has an 80%      circumflex lesion which was the target for the revascularization.  He      has branch vessel disease in the branches of the circumflex.  This is a      dominant vessel, and the posterolateral branches appear  to be diseased      as well, although these were not treated, and his right coronary artery      is nondominant and appears to have also relatively significant disease      with collateralization from the left circumflex.  He has hypertension,      hyperlipidemia.  He was previously treated with a statin agent but had      some myalgias and has refused to take any further medications.  His      medications prior to admission were Lopressor 50 mg b.i.d. and aspirin      162 mg once daily.  Here in the hospital, he is on IV Protonix, IV      normal saline, ciprofloxacin 400 mg IV q.12h. and morphine p.r.n.  He      lives in [place] with his wife.  He is married.  He has three children,  and he used to smoke, but he quit, has probably about a 14-15 pack-year-      history, no alcohol, no drugs.  Mother died at age 63 of heart failure.      Father died at age 39 of heart failure.  He has two brothers with      coronary disease, both of whom have stents.   REVIEW OF SYSTEMS:  GENERAL:  Negative with the exception of some myalgias  that were thought to be secondary to the nonsteroidal anti-inflammatories  and the abdominal pain that got him into the hospital.   PHYSICAL EXAMINATION:  GENERAL:  He is a well-developed, well-nourished,  heavy-set white male in no apparent distress.  He is alert and oriented x 4.  VITAL SIGNS:  His pulse is 88.  His blood pressure is 104/53.  His  respiratory rate is 20.  HEENT:  Unremarkable.  NECK:  Supple.  There is no jugular venous distention or carotid bruits.  CHEST:  Clear to auscultation with some coarse breath sounds at the bases.  CARDIOVASCULAR:  Regular with no obvious murmurs.  Point of maximal impulse  is not palpable in the normal first and second heart sounds.  ABDOMEN:  Soft, nontender.  GU/RECTAL/NEUROLOGIC:  Deferred.  EXTREMITIES:  He has no cyanosis, clubbing, or edema, and his pulses are all  1+ throughout.  MUSCULOSKELETAL:   Unremarkable.   He has an abdominal CT which shows a distended gallbladder with gallstones.  His chest CT shows mild atheromatous changes in the thoracic and abdominal  aorta without obvious dissection.  He has coronary calcification and a  prominent mediastinal lymphadenopathy.  EKG:  Sinus rhythm, rate of 96,  normal axes, no intervals, Q restoration slightly prolonged at 102.  No LVH.  He has nonspecific ST-T wave changes.  We have no old EKGs for comparison.  White blood cell count 16.1, H&H of 13 and 39 with a platelet count of 170.  Sodium 137, potassium 3.8, chloride 103, bicarb 28, BUN 20, creatinine 1.4,  and his blood sugar is 134.  Four sets of cardiac enzymes are not consistent  with acute myocardial infarction.  His liver function studies all appear to  be normal.   So, this is a gentleman with known coronary artery disease and an episode of  chest pain after beta blocker withdrawal in the setting of acute illness  with cholecystitis.  He is preop for a cholecystectomy.  The discomfort  sounds a little bit atypical for coronary disease, but certainly with the  other competing issues, probably worthwhile to pursue this relatively  aggressively.  I think he probably benefits from an adenosine Cardiolite,  and I am going to recommend that be done today if possible to get him ready  for his surgery.  Obviously, if the adenosine Cardiolite shows no  significant ischemia and his LV function is normal, his perioperative risk  from a cardiovascular standpoint is relatively low.  If, on the other hand,  there is significant ischemic burden, then he may benefit from further  evaluation, including a heart catheterization.  His blood pressure appears  to be reasonably well controlled.  The issue with his cholesterol is  something that needs to be addressed by his outpatient cardiologist.  Currently, he probably benefits from not being on a statin because of his cholecystitis.  One other  point is that he should probably have his beta  blocker added back, as this has been shown to clearly decrease  the risk of  perioperative morbidity and mortality, especially in people with coronary  artery disease, and he would definitely fit into a high risk category.     Trey Paula   JH/MEDQ  D:  05/08/2004  T:  05/08/2004  Job:  [id number]

## 2010-09-08 NOTE — H&P (Signed)
[place]. [facility]  Patient:    David Collier, David Collier Visit Number: [id number] MRN: [id number]          Service Type: MED Location: 1800 [id number] Attending Physician:  Nelia Shi Dictated by:   Everardo Beals Juanda Chance, M.D. [facility] Admit Date:  06/20/2001   CC:         Luis Abed, M.D. 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  We do not have records from that yet.  Two weeks ago I was preaching him about anterior chest pain and burning which he described as burning and pressure sensation. He took a friends nitroglycerin with relief.  This week he had recurrent pain while walking with radiation into his neck and left shoulder. There was no associated shortness of breath, diaphoresis, nausea or vomiting.  He had recurrent pain last night and went to [facility] emergency room where he was admitted and treated with aspirin and IV heparin and arrangements were made to transfer him here today.  PAST MEDICAL HISTORY:  Significant for: 1. Hypertension, although he quit taking medications for this some time ago. 2. There is no history of diabetes, stroke, bleeding, or peptic ulcer disease.    He does not know his lipid values. 3. He did see an ophthalmologist who told him that he had some macular    degeneration.  CURRENT MEDICATIONS: 1. Aspirin. 2. Vitamin E. 3. Garlic. 4. Glucosamine.  SOCIAL HISTORY:  He lives in [place] with his wife in a mobile home.  He does not smoke.  He plays golf.  FAMILY HISTORY:  His mother died at age 65 of heart failure and his father died at age 59 of heart failure and a myocardial infarction.  He has 3 brothers and two of them have heart problems and he has 3 sisters and 1 has hypertension and 1 had a myocardial infarction.  He had 1 brother who died at 10 months of spinal  meningitis.  REVIEW OF SYSTEMS:  See Halford Chessman note in the chart.  PHYSICAL EXAMINATION:  VITAL SIGNS:  Blood pressure 127/94 and the pulse is 76 and regular.  He was in no distress.  SKIN:  The skin was warm and dry.  NECK:  The neck showed no venous distention and there were no carotid bruits and the thyroid is not enlarged.  LUNGS:  Clear without rales or rhonchi.  HEART:  [place] was quiet.  The heart sounds were normal although distant.  I could hear no murmurs.  ABDOMEN:  Somewhat protuberant.  Bowel sounds were present.  There were no bruits.  There was no hepatosplenomegaly or masses.  EXTREMITIES:  Had good pulses and there was no peripheral edema.  There were no skeletal deformities.  NEUROLOGIC:  Negative.  An EKG showed slight lateral ST depression on 1 ECG which resolved.  His troponin levels were 0.19 and 0.16.  IMPRESSION: 1. Non Q wave myocardial infarction with lateral ST depression and mild elevation of troponins. 2. Previously documented coronary artery disease status post remote PCIs in    the 1980s. 3. Hypertension. 4. History of remote cardiac arrest in 1972, records pending.  RECOMMENDATIONS:  Will plan to continue the patient on heparin and aspirin and add a 2b3 inhibitor.  Will plan catheterization later today. Dictated by:   Everardo Beals Juanda Chance, M.D. [facility]  Attending Physician:  Nelia Shi DD:  06/20/01 TD:  06/20/01 Job: [id number]

## 2010-09-08 NOTE — Procedures (Signed)
NAMEYANI, LAL              ACCOUNT NO.:  [id number]   MEDICAL RECORD NO.:  [id number]          PATIENT TYPE:  INP   LOCATION:  A216                          FACILITY:  [facility]   PHYSICIAN:  Pricilla Riffle, M.D.    DATE OF BIRTH:  10-01-1928   DATE OF PROCEDURE:  05/08/2004  DATE OF DISCHARGE:                                    STRESS TEST   HISTORY:  Mr. Degregory is a 77 year old gentleman with coronary artery  disease, status post stent to his obtuse marginal and circumflex in February  2003.  He now presents with cholecystitis and is scheduled for a  laparoscopic cholecystectomy.  However, last p.m., he had an episode of  chest discomfort.  His cardiac enzymes were negative x 3 for an acute  myocardial infarction.  His EKG shows no acute ischemic changes.   Baseline data EKG reveals a sinus rhythm and 93 beats per minute with some  nonspecific ST abnormalities.  Blood pressure is 128/70.   The patient exercised at a modified Bruce protocol for 8 minutes to 4.6 MHz.  Maximum heart rate was 129 beats per minute which is 88% of predicted  maximum.  Maximum blood pressure was 172/70 and resolved onto 140/68 in  recovery.   The patient reported minimal discomfort substernally with no other  associated symptoms.  EKG revealed no ischemic changes and few PVCs.   Final images and results are pending.  MD reviewed.      AB/MEDQ  D:  05/08/2004  T:  05/08/2004  Job:  [id number]

## 2011-08-17 ENCOUNTER — Ambulatory Visit (INDEPENDENT_AMBULATORY_CARE_PROVIDER_SITE_OTHER): Payer: Medicare Other | Admitting: Cardiovascular Disease

## 2011-08-17 ENCOUNTER — Encounter: Payer: Self-pay | Admitting: Cardiovascular Disease

## 2011-08-17 VITALS — BP 122/70 | HR 53 | Ht 69.0 in | Wt 219.8 lb

## 2011-08-17 DIAGNOSIS — I2510 Atherosclerotic heart disease of native coronary artery without angina pectoris: Secondary | ICD-10-CM

## 2011-08-17 DIAGNOSIS — I10 Essential (primary) hypertension: Secondary | ICD-10-CM

## 2011-08-17 DIAGNOSIS — E782 Mixed hyperlipidemia: Secondary | ICD-10-CM

## 2011-08-17 MED ORDER — NITROGLYCERIN 0.4 MG SL SUBL: 0.4000 mg | SUBLINGUAL_TABLET | SUBLINGUAL | Status: DC | PRN

## 2011-08-17 NOTE — Patient Instructions (Addendum)
Your physician wants you to follow-up in: 1 YEAR.  You will receive a reminder letter in the mail two months in advance. If you don't receive a letter, please call our office to schedule the follow-up appointment.  Please mail a copy of your most recent lab work to our office.  Your physician recommends that you continue on your current medications as directed. Please refer to the Current Medication list given to you today.

## 2011-08-17 NOTE — Progress Notes (Signed)
   HPI:  78 year-old male presenting for followup evaluation. The patient is followed for coronary artery disease. His last PCI procedure was in 2010 he presented with unstable angina and was found to have severe in-stent restenosis. He was treated with cutting balloon angioplasty. He's had no further ischemic symptoms. At the time of his last office visit he was noted to have hypertension, but he had not taken his morning metoprolol and no medication changes were recommended at that time.  The patient is doing fairly well from a cardiovascular perspective. He denies chest pain or pressure. He denies dyspnea, edema, or palpitations. He is limited by knee and low back pain. 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  Outpatient Encounter Prescriptions as of 08/17/2011  Medication Sig Dispense Refill  . Ascorbic Acid (VITAMIN C) 500 MG tablet Take 500 mg by mouth daily.        Marland Kitchen aspirin 81 MG tablet Take 81 mg by mouth daily.        . Coenzyme Q10 (COQ10) 100 MG CAPS Take 1 capsule by mouth daily.       Marland Kitchen latanoprost (XALATAN) 0.005 % ophthalmic solution Place 3 drops into the right eye daily.       . metoprolol (LOPRESSOR) 50 MG tablet 1/2 tab po bid  90 tablet  3  . nitroGLYCERIN (NITROSTAT) 0.4 MG SL tablet Place 0.4 mg under the tongue every 5 (five) minutes as needed.        . Red Yeast Rice 600 MG CAPS Take 1 capsule by mouth 2 (two) times daily.        . vitamin E 400 UNIT capsule Take 400 Units by mouth daily.          Allergies  Allergen Reactions  . Atorvastatin   . Simvastatin     Past Medical History  Diagnosis Date  . Hyperlipidemia   . Myocardial infarction   . CAD (coronary artery disease)   . DJD (degenerative joint disease)   . Macular degeneration     ROS: Negative except as per HPI  BP 122/70  Pulse 53  Ht 5\' 9"  (1.753 m)  Wt 99.701 kg (219 lb 12.8 oz)  BMI 32.46 kg/m2  PHYSICAL EXAM: Pt is alert and  oriented, elderly gentleman in NAD HEENT: normal Neck: JVP - normal, carotids 2+= without bruits Lungs: CTA bilaterally CV: RRR without murmur or gallop Abd: soft, NT, Positive BS, no hepatomegaly Ext: no C/C/E, distal pulses intact and equal Skin: warm/dry no rash  EKG:  Sinus brady 53 bpm, age-indeterminate inferior infarct  ASSESSMENT AND PLAN:

## 2011-08-18 ENCOUNTER — Other Ambulatory Visit: Payer: Self-pay | Admitting: Cardiovascular Disease

## 2011-08-21 ENCOUNTER — Encounter: Payer: Self-pay | Admitting: Cardiovascular Disease

## 2011-08-21 NOTE — Assessment & Plan Note (Signed)
Needs lipid follow-up and lft's - will order.

## 2011-08-21 NOTE — Assessment & Plan Note (Signed)
Stable without angina. He is on ASA and metoprolol. The patient is statin-intolerant and will continue on red yeast rice. He will need lab follow-up since he has no PCP.

## 2011-08-21 NOTE — Assessment & Plan Note (Signed)
Well controlled on metoprolol alone.  

## 2012-09-01 ENCOUNTER — Encounter: Payer: Self-pay | Admitting: Cardiovascular Disease

## 2012-09-01 ENCOUNTER — Ambulatory Visit (INDEPENDENT_AMBULATORY_CARE_PROVIDER_SITE_OTHER): Payer: Medicare Other | Admitting: Cardiovascular Disease

## 2012-09-01 VITALS — BP 122/84 | HR 51 | Ht 69.0 in | Wt 207.0 lb

## 2012-09-01 DIAGNOSIS — E782 Mixed hyperlipidemia: Secondary | ICD-10-CM

## 2012-09-01 DIAGNOSIS — I2510 Atherosclerotic heart disease of native coronary artery without angina pectoris: Secondary | ICD-10-CM

## 2012-09-01 LAB — LIPID PANEL
Cholesterol: 195 mg/dL (ref 0–200)
LDL Cholesterol: 135 mg/dL — ABNORMAL HIGH (ref 0–99)
Total CHOL/HDL Ratio: 6
VLDL: 27 mg/dL (ref 0.0–40.0)

## 2012-09-01 LAB — CBC WITH DIFFERENTIAL/PLATELET
Eosinophils Relative: 4.7 % (ref 0.0–5.0)
HCT: 44.6 % (ref 39.0–52.0)
Lymphocytes Relative: 22.9 % (ref 12.0–46.0)
Lymphs Abs: 2 10*3/uL (ref 0.7–4.0)
Monocytes Relative: 9.5 % (ref 3.0–12.0)
Platelets: 210 10*3/uL (ref 150.0–400.0)
WBC: 8.9 10*3/uL (ref 4.5–10.5)

## 2012-09-01 LAB — BASIC METABOLIC PANEL
BUN: 21 mg/dL (ref 6–23)
Calcium: 9.1 mg/dL (ref 8.4–10.5)
GFR: 52.03 mL/min — ABNORMAL LOW (ref >60.00)
Potassium: 4.7 mEq/L (ref 3.5–5.1)
Sodium: 139 mEq/L (ref 135–145)

## 2012-09-01 LAB — HEPATIC FUNCTION PANEL
ALT: 13 U/L (ref 0–53)
AST: 16 U/L (ref 0–37)
Alkaline Phosphatase: 53 U/L (ref 39–117)
Bilirubin, Direct: 0.1 mg/dL (ref 0.0–0.3)
Total Bilirubin: 0.5 mg/dL (ref 0.3–1.2)

## 2012-09-01 NOTE — Progress Notes (Signed)
   HPI:  79 year old gentleman presenting for followup evaluation. The patient has coronary artery disease. His last heart catheterization was in 2010. He's undergone prior stenting of the left circumflex and obtuse marginal branches. At the time of most recent cardiac catheterization he was noted to have moderate diffuse LAD stenosis and this was managed medically. He had developed severe in-stent restenosis in the left circumflex and this was treated with cutting balloon angioplasty. He presents today for his regular followup.  The patient has been doing well. He continues to work as a Dietitian, but reaches a little less frequently than in the past. He is physically sedentary. He denies chest pain, chest pressure, dyspnea, or leg swelling. He is compliant with his medications. He denies lightheadedness or syncope.  Outpatient Encounter Prescriptions as of 09/01/2012  Medication Sig Dispense Refill  . Ascorbic Acid (VITAMIN C) 500 MG tablet Take 500 mg by mouth daily.        Marland Kitchen aspirin 81 MG tablet Take 81 mg by mouth daily.        . Coenzyme Q10 (COQ10) 100 MG CAPS Take 1 capsule by mouth daily.       . metoprolol (LOPRESSOR) 50 MG tablet TAKE ONE-HALF TABLET BY MOUTH TWICE DAILY  60 tablet  12  . nitroGLYCERIN (NITROSTAT) 0.4 MG SL tablet Place 1 tablet (0.4 mg total) under the tongue every 5 (five) minutes as needed.  25 tablet  2  . Red Yeast Rice 600 MG CAPS Take 1 capsule by mouth 2 (two) times daily.        . [DISCONTINUED] latanoprost (XALATAN) 0.005 % ophthalmic solution Place 3 drops into the right eye daily.       . [DISCONTINUED] vitamin E 400 UNIT capsule Take 400 Units by mouth daily.         No facility-administered encounter medications on file as of 09/01/2012.    Allergies  Allergen Reactions  . Atorvastatin   . Simvastatin     Past Medical History  Diagnosis Date  . Hyperlipidemia   . Myocardial infarction   . CAD (coronary artery disease)   . DJD (degenerative  joint disease)   . Macular degeneration     ROS: Negative except as per HPI  BP 122/84  Pulse 51  Ht 5\' 9"  (1.753 m)  Wt 93.895 kg (207 lb)  BMI 30.55 kg/m2  PHYSICAL EXAM: Pt is alert and oriented, pleasant elderly male in NAD HEENT: normal Neck: JVP - normal, carotids 2+= with a left carotid bruits Lungs: CTA bilaterally CV: RRR without murmur or gallop Abd: soft, NT, Positive BS, no hepatomegaly Ext: no C/C/E, distal pulses intact and equal Skin: warm/dry no rash  EKG:  Sinus bradycardia 51 beats per minute, first degree AV block, right bundle branch block.  ASSESSMENT AND PLAN: 1. Coronary artery disease, native vessel. The patient is stable without anginal symptoms. Will continue aspirin for antiplatelet therapy, beta blocker, and red yeast rice for lipid lowering.  2. Hyperlipidemia. Lipids have not been drawn in quite some time. Will repeat labs today. He is on red yeast rice because of statin intolerance.  3. Asymptomatic bruit. Observation for now considering his advanced age and asymptomatic status.  Tonny Bollman 09/01/2012 11:26 AM

## 2012-09-01 NOTE — Patient Instructions (Signed)
Your physician recommends that you have lab work today: BMP, LIVER, LIPID and CBC  Your physician wants you to follow-up in: 1 YEAR with Dr Excell Seltzer.  You will receive a reminder letter in the mail two months in advance. If you don't receive a letter, please call our office to schedule the follow-up appointment.  Your physician recommends that you continue on your current medications as directed. Please refer to the Current Medication list given to you today.

## 2012-10-10 ENCOUNTER — Encounter (HOSPITAL_COMMUNITY): Payer: Self-pay | Admitting: *Deleted

## 2012-10-10 ENCOUNTER — Emergency Department (HOSPITAL_COMMUNITY)
Admission: EM | Admit: 2012-10-10 | Discharge: 2012-10-10 | Disposition: A | Discharge status: Home / Self Care | Payer: Medicare Other | Attending: Emergency Medicine | Admitting: Emergency Medicine

## 2012-10-10 DIAGNOSIS — Z8739 Personal history of other diseases of the musculoskeletal system and connective tissue: Secondary | ICD-10-CM | POA: Insufficient documentation

## 2012-10-10 DIAGNOSIS — I252 Old myocardial infarction: Secondary | ICD-10-CM | POA: Insufficient documentation

## 2012-10-10 DIAGNOSIS — M542 Cervicalgia: Secondary | ICD-10-CM | POA: Insufficient documentation

## 2012-10-10 DIAGNOSIS — Z862 Personal history of diseases of the blood and blood-forming organs and certain disorders involving the immune mechanism: Secondary | ICD-10-CM | POA: Insufficient documentation

## 2012-10-10 DIAGNOSIS — Z8669 Personal history of other diseases of the nervous system and sense organs: Secondary | ICD-10-CM | POA: Insufficient documentation

## 2012-10-10 DIAGNOSIS — R51 Headache: Secondary | ICD-10-CM | POA: Insufficient documentation

## 2012-10-10 DIAGNOSIS — Z79899 Other long term (current) drug therapy: Secondary | ICD-10-CM | POA: Insufficient documentation

## 2012-10-10 DIAGNOSIS — Z87891 Personal history of nicotine dependence: Secondary | ICD-10-CM | POA: Insufficient documentation

## 2012-10-10 DIAGNOSIS — I2510 Atherosclerotic heart disease of native coronary artery without angina pectoris: Secondary | ICD-10-CM | POA: Insufficient documentation

## 2012-10-10 DIAGNOSIS — Z8639 Personal history of other endocrine, nutritional and metabolic disease: Secondary | ICD-10-CM | POA: Insufficient documentation

## 2012-10-10 DIAGNOSIS — Z7982 Long term (current) use of aspirin: Secondary | ICD-10-CM | POA: Insufficient documentation

## 2012-10-10 DIAGNOSIS — M549 Dorsalgia, unspecified: Secondary | ICD-10-CM | POA: Insufficient documentation

## 2012-10-10 MED ORDER — METHOCARBAMOL 500 MG PO TABS
500.0000 mg | ORAL_TABLET | Freq: Once | ORAL | Status: AC
  Administered 2012-10-10: 500 mg via ORAL
  Filled 2012-10-10: qty 1

## 2012-10-10 MED ORDER — IBUPROFEN 400 MG PO TABS
400.0000 mg | ORAL_TABLET | Freq: Once | ORAL | Status: AC
  Administered 2012-10-10: 400 mg via ORAL
  Filled 2012-10-10: qty 1

## 2012-10-10 MED ORDER — METHOCARBAMOL 500 MG PO TABS: ORAL_TABLET | ORAL | Status: DC

## 2012-10-10 NOTE — ED Notes (Signed)
Left in c/o wife for transport home; instructions, prescriptions and f/u information given/reviewed - verbalizes understanding.  

## 2012-10-10 NOTE — ED Notes (Addendum)
Pt states he has had two spells of bad headaches. Pain to the back of head down his neck. Nausea also. Denies hx of headaches.

## 2012-10-10 NOTE — ED Provider Notes (Signed)
History     CSN: [id number]  Arrival date & time 10/10/12  1657   First MD Initiated Contact with Patient 10/10/12 1705      No chief complaint on file.   (Consider location/radiation/quality/duration/timing/severity/associated sxs/prior treatment) HPI  Patient reports about 9 AM this morning he started having discomfort in the back of his head into the neck area. He states he took some ibuprofen 600 mg and the pain got better. He relates the pain restarted this afternoon and he took a sublingual nitroglycerin and states that also helped the discomfort. He denies chest pain, shortness of breath, but does have nausea without vomiting. He states the area is sore to touch. He denies fever. He is very vague about when it started and he states he's had this for couple months however his wife seems confused when he says that and states  he just started complaining about it today. He states his pain at its worst today was 8/10 and currently is a 4/10. He denies any trauma.  PCP [facility] Cardiologist Dr Excell Seltzer  Past Medical History  Diagnosis Date  . Hyperlipidemia   . Myocardial infarction   . CAD (coronary artery disease)   . DJD (degenerative joint disease)   . Macular degeneration     Past Surgical History  Procedure Laterality Date  . Cardiac catheterization    . Appendectomy      Family History  Problem Relation Age of Onset  . Heart failure      History  Substance Use Topics  . Smoking status: Former Smoker    Types: Cigarettes    Quit date: 04/23/1966  . Smokeless tobacco: Not on file  . Alcohol Use: No   Lives at home Lives with spouse   Review of Systems  All other systems reviewed and are negative.    Allergies  Atorvastatin and Simvastatin  Home Medications   Current Outpatient Rx  Name  Route  Sig  Dispense  Refill  . Ascorbic Acid (VITAMIN C) 500 MG tablet   Oral   Take 500 mg by mouth daily.           Marland Kitchen aspirin 81 MG tablet   Oral   Take 81 mg by mouth daily.           . Coenzyme Q10 (COQ10) 100 MG CAPS   Oral   Take 1 capsule by mouth daily.          . methocarbamol (ROBAXIN) 500 MG tablet      Take 1 po TID prn pain in neck/posterior head   60 tablet   0   . metoprolol (LOPRESSOR) 50 MG tablet      TAKE ONE-HALF TABLET BY MOUTH TWICE DAILY   60 tablet   12   . nitroGLYCERIN (NITROSTAT) 0.4 MG SL tablet   Sublingual   Place 1 tablet (0.4 mg total) under the tongue every 5 (five) minutes as needed.   25 tablet   2   . Red Yeast Rice 600 MG CAPS   Oral   Take 1 capsule by mouth 2 (two) times daily.             BP 132/57  Pulse 68  Temp(Src) 97.6 F (36.4 C) (Oral)  Resp 20  Ht 5\' 9"  (1.753 m)  Wt 205 lb (92.987 kg)  BMI 30.26 kg/m2  SpO2 99%  Vital signs normal    Physical Exam  Nursing note and vitals reviewed. Constitutional: He is  oriented to person, place, and time. He appears well-developed and well-nourished.  Non-toxic appearance. He does not appear ill. No distress.  HENT:  Head: Normocephalic and atraumatic.    Right Ear: External ear normal.  Left Ear: External ear normal.  Nose: Nose normal. No mucosal edema or rhinorrhea.  Mouth/Throat: Oropharynx is clear and moist and mucous membranes are normal. No dental abscesses or edematous.  Area of discomfort noted, no masses, lesions of the skin, redness seen.   Eyes: Conjunctivae and EOM are normal. Pupils are equal, round, and reactive to light.  Neck: Normal range of motion and full passive range of motion without pain. Neck supple.  I palpate the patient's posterior head were he states he feels the knot however even he states the knot is not present at this time.  Pulmonary/Chest: Effort normal. No respiratory distress. He has no rhonchi. He exhibits no crepitus.  Abdominal: Normal appearance.  Musculoskeletal: Normal range of motion. He exhibits no edema and no tenderness.  Moves all extremities well.   Neurological:  He is alert and oriented to person, place, and time. He has normal strength. No cranial nerve deficit.  Skin: Skin is warm, dry and intact. No rash noted. No erythema. No pallor.  Psychiatric: He has a normal mood and affect. His speech is normal and behavior is normal. His mood appears not anxious.    ED Course  Procedures (including critical care time)  Medications  ibuprofen (ADVIL,MOTRIN) tablet 400 mg (400 mg Oral Given 10/10/12 1754)  methocarbamol (ROBAXIN) tablet 500 mg (500 mg Oral Given 10/10/12 1754)    Pt states he doesn't think the medications helped, however his pain is gone now.    1. Headache     Discharge Medication List as of 10/10/2012  6:48 PM    START taking these medications   Details  methocarbamol (ROBAXIN) 500 MG tablet Take 1 po TID prn pain in neck/posterior head, Print       Plan discharge  Devoria Albe, MD, FACEP   .  MDM          Ward Givens, MD 10/10/12 2214

## 2012-10-13 ENCOUNTER — Other Ambulatory Visit: Payer: Self-pay | Admitting: Cardiovascular Disease

## 2012-12-22 ENCOUNTER — Observation Stay (HOSPITAL_COMMUNITY)
Admission: EM | Admit: 2012-12-22 | Discharge: 2012-12-23 | Disposition: A | Discharge status: Home / Self Care | Payer: Medicare Other | Attending: Internal Medicine | Admitting: Internal Medicine

## 2012-12-22 ENCOUNTER — Encounter (HOSPITAL_COMMUNITY): Payer: Self-pay | Admitting: Emergency Medicine

## 2012-12-22 ENCOUNTER — Emergency Department (HOSPITAL_COMMUNITY): Payer: Medicare Other

## 2012-12-22 DIAGNOSIS — N179 Acute kidney failure, unspecified: Secondary | ICD-10-CM | POA: Diagnosis not present

## 2012-12-22 DIAGNOSIS — M159 Polyosteoarthritis, unspecified: Secondary | ICD-10-CM

## 2012-12-22 DIAGNOSIS — Z9861 Coronary angioplasty status: Secondary | ICD-10-CM | POA: Insufficient documentation

## 2012-12-22 DIAGNOSIS — E785 Hyperlipidemia, unspecified: Secondary | ICD-10-CM | POA: Insufficient documentation

## 2012-12-22 DIAGNOSIS — R079 Chest pain, unspecified: Secondary | ICD-10-CM

## 2012-12-22 DIAGNOSIS — E782 Mixed hyperlipidemia: Secondary | ICD-10-CM

## 2012-12-22 DIAGNOSIS — I219 Acute myocardial infarction, unspecified: Secondary | ICD-10-CM

## 2012-12-22 DIAGNOSIS — Z79899 Other long term (current) drug therapy: Secondary | ICD-10-CM | POA: Insufficient documentation

## 2012-12-22 DIAGNOSIS — I4510 Unspecified right bundle-branch block: Secondary | ICD-10-CM | POA: Insufficient documentation

## 2012-12-22 DIAGNOSIS — Z7982 Long term (current) use of aspirin: Secondary | ICD-10-CM | POA: Insufficient documentation

## 2012-12-22 DIAGNOSIS — I959 Hypotension, unspecified: Secondary | ICD-10-CM | POA: Insufficient documentation

## 2012-12-22 DIAGNOSIS — H3530 Unspecified macular degeneration: Secondary | ICD-10-CM | POA: Insufficient documentation

## 2012-12-22 DIAGNOSIS — M1990 Unspecified osteoarthritis, unspecified site: Secondary | ICD-10-CM | POA: Insufficient documentation

## 2012-12-22 DIAGNOSIS — R42 Dizziness and giddiness: Secondary | ICD-10-CM | POA: Insufficient documentation

## 2012-12-22 DIAGNOSIS — I252 Old myocardial infarction: Secondary | ICD-10-CM | POA: Insufficient documentation

## 2012-12-22 DIAGNOSIS — I10 Essential (primary) hypertension: Secondary | ICD-10-CM | POA: Diagnosis present

## 2012-12-22 DIAGNOSIS — R0989 Other specified symptoms and signs involving the circulatory and respiratory systems: Secondary | ICD-10-CM | POA: Insufficient documentation

## 2012-12-22 DIAGNOSIS — I440 Atrioventricular block, first degree: Secondary | ICD-10-CM | POA: Insufficient documentation

## 2012-12-22 DIAGNOSIS — Z87891 Personal history of nicotine dependence: Secondary | ICD-10-CM | POA: Insufficient documentation

## 2012-12-22 DIAGNOSIS — R0789 Other chest pain: Principal | ICD-10-CM | POA: Insufficient documentation

## 2012-12-22 DIAGNOSIS — I2510 Atherosclerotic heart disease of native coronary artery without angina pectoris: Secondary | ICD-10-CM | POA: Diagnosis present

## 2012-12-22 HISTORY — DX: Atherosclerotic heart disease of native coronary artery without angina pectoris: I25.10

## 2012-12-22 LAB — TROPONIN I: Troponin I: <0.3 ng/mL (ref <0.30)

## 2012-12-22 LAB — CBC WITH DIFFERENTIAL/PLATELET
Eosinophils Absolute: 0.3 10*3/uL (ref 0.0–0.7)
Hemoglobin: 14.6 g/dL (ref 13.0–17.0)
Lymphocytes Relative: 23 % (ref 12–46)
Lymphs Abs: 1.9 10*3/uL (ref 0.7–4.0)
MCH: 30 pg (ref 26.0–34.0)
Monocytes Relative: 7 % (ref 3–12)
Neutro Abs: 5.6 10*3/uL (ref 1.7–7.7)
Neutrophils Relative %: 67 % (ref 43–77)
Platelets: 243 10*3/uL (ref 150–400)
RBC: 4.86 MIL/uL (ref 4.22–5.81)
WBC: 8.3 10*3/uL (ref 4.0–10.5)

## 2012-12-22 LAB — BASIC METABOLIC PANEL
CO2: 25 mEq/L (ref 19–32)
Calcium: 9.5 mg/dL (ref 8.4–10.5)
GFR calc non Af Amer: 41 mL/min — ABNORMAL LOW (ref >90)
Glucose, Bld: 134 mg/dL — ABNORMAL HIGH (ref 70–99)
Potassium: 4.3 mEq/L (ref 3.5–5.1)
Sodium: 137 mEq/L (ref 135–145)

## 2012-12-22 MED ORDER — ONDANSETRON HCL 4 MG/2ML IJ SOLN: 4.0000 mg | Freq: Three times a day (TID) | INTRAMUSCULAR | Status: DC | PRN

## 2012-12-22 MED ORDER — ONDANSETRON HCL 4 MG/2ML IJ SOLN: 4.0000 mg | Freq: Four times a day (QID) | INTRAMUSCULAR | Status: DC | PRN

## 2012-12-22 MED ORDER — ASPIRIN 81 MG PO CHEW
324.0000 mg | CHEWABLE_TABLET | Freq: Once | ORAL | Status: AC
  Administered 2012-12-22: 324 mg via ORAL
  Filled 2012-12-22 (×2): qty 4

## 2012-12-22 MED ORDER — ONDANSETRON HCL 4 MG PO TABS: 4.0000 mg | ORAL_TABLET | Freq: Four times a day (QID) | ORAL | Status: DC | PRN

## 2012-12-22 MED ORDER — ENOXAPARIN SODIUM 40 MG/0.4ML ~~LOC~~ SOLN: 40.0000 mg | SUBCUTANEOUS | Status: DC

## 2012-12-22 MED ORDER — NITROGLYCERIN 0.4 MG SL SUBL
0.4000 mg | SUBLINGUAL_TABLET | SUBLINGUAL | Status: DC | PRN
  Administered 2012-12-22 (×2): 0.4 mg via SUBLINGUAL
  Filled 2012-12-22: qty 25

## 2012-12-22 MED ORDER — SODIUM CHLORIDE 0.9 % IV SOLN: INTRAVENOUS | Status: DC

## 2012-12-22 MED ORDER — MORPHINE SULFATE 4 MG/ML IJ SOLN: 4.0000 mg | INTRAMUSCULAR | Status: DC | PRN

## 2012-12-22 MED ORDER — MORPHINE SULFATE 2 MG/ML IJ SOLN
2.0000 mg | INTRAMUSCULAR | Status: DC | PRN
  Administered 2012-12-22: 2 mg via INTRAVENOUS
  Filled 2012-12-22: qty 1

## 2012-12-22 MED ORDER — SODIUM CHLORIDE 0.9 % IV BOLUS (SEPSIS)
250.0000 mL | Freq: Once | INTRAVENOUS | Status: AC
  Administered 2012-12-22: 250 mL via INTRAVENOUS

## 2012-12-22 MED ORDER — ASPIRIN EC 81 MG PO TBEC
81.0000 mg | DELAYED_RELEASE_TABLET | Freq: Every day | ORAL | Status: DC
  Administered 2012-12-23: 81 mg via ORAL
  Filled 2012-12-22 (×3): qty 1

## 2012-12-22 MED ORDER — ENOXAPARIN SODIUM 40 MG/0.4ML ~~LOC~~ SOLN
40.0000 mg | SUBCUTANEOUS | Status: DC
  Administered 2012-12-22: 40 mg via SUBCUTANEOUS
  Filled 2012-12-22: qty 0.4

## 2012-12-22 MED ORDER — METOPROLOL TARTRATE 25 MG PO TABS
25.0000 mg | ORAL_TABLET | Freq: Two times a day (BID) | ORAL | Status: DC
  Administered 2012-12-22 – 2012-12-23 (×2): 25 mg via ORAL
  Filled 2012-12-22 (×2): qty 1

## 2012-12-22 NOTE — H&P (Addendum)
PCP:   Cassell Smiles., MD   Chief Complaint:  Chest pain  HPI: 79 year old male with a history of CAD, status post stent restenosis requiring balloon angioplasty in 2010 and followed by cardiology. Today patient developed severe chest pain while he walked outside onto his porch when he felt lightheaded and started having chest pain which was midsternal and  was radiating to the left arm, this was associated with nausea but no shortness of breath. The pain was persistent, also he had diaphoresis. Patient took nitroglycerin with some partial relief, but then the pain came back and so patient was brought to the ED. In the ED patient is pain-free at this time, EKG shows first degree AV block and right bundle branch block.  Allergies:   Allergies  Allergen Reactions  . Atorvastatin   . Simvastatin       Past Medical History  Diagnosis Date  . Hyperlipidemia   . Myocardial infarction   . CAD (coronary artery disease)   . DJD (degenerative joint disease)   . Macular degeneration     Past Surgical History  Procedure Laterality Date  . Cardiac catheterization    . Appendectomy      Prior to Admission medications   Medication Sig Start Date End Date Taking? Authorizing Provider  Ascorbic Acid (VITAMIN C) 500 MG tablet Take 500 mg by mouth daily.     Yes Historical Provider, MD  aspirin EC 81 MG tablet Take 81 mg by mouth daily.   Yes Historical Provider, MD  Coenzyme Q10 (COQ10) 100 MG CAPS Take 1 capsule by mouth daily.    Yes Historical Provider, MD  metoprolol (LOPRESSOR) 50 MG tablet Take 25 mg by mouth 2 (two) times daily.   Yes Historical Provider, MD  nitroGLYCERIN (NITROSTAT) 0.4 MG SL tablet Place 0.4 mg under the tongue every 5 (five) minutes as needed for chest pain.   Yes Historical Provider, MD  Red Yeast Rice 600 MG CAPS Take 1 capsule by mouth 2 (two) times daily.     Yes Historical Provider, MD    Social History:  reports that he quit smoking about 46 years ago.  His smoking use included Cigarettes. He smoked 0.00 packs per day. He does not have any smokeless tobacco history on file. He reports that he does not drink alcohol or use illicit drugs.  Family History  Problem Relation Age of Onset  . Heart failure       All the positives are listed in BOLD  Review of Systems:  HEENT: Headache, blurred vision, runny nose, sore throat Neck: Hypothyroidism, hyperthyroidism,,lymphadenopathy Chest : Shortness of breath, history of COPD, Asthma Heart : Chest pain, history of coronary arterey disease GI:  Nausea, vomiting, diarrhea, constipation, GERD GU: Dysuria, urgency, frequency of urination, hematuria Neuro: Stroke, seizures, syncope Psych: Depression, anxiety, hallucinations   Physical Exam: Blood pressure 119/58, pulse 80, temperature 98.1 F (36.7 C), temperature source Oral, resp. rate 18, SpO2 99.00%. Constitutional:   Patient is a well-developed and well-nourished male in no acute distress and cooperative with exam. Head: Normocephalic and atraumatic Mouth: Mucus membranes moist Eyes: PERRL, EOMI, conjunctivae normal Neck: Supple, No Thyromegaly Cardiovascular: RRR, S1 normal, S2 normal Pulmonary/Chest: CTAB, no wheezes, rales, or rhonchi Abdominal: Soft. Non-tender, non-distended, bowel sounds are normal, no masses, organomegaly, or guarding present.  Neurological: A&O x3, Strenght is normal and symmetric bilaterally, cranial nerve II-XII are grossly intact, no focal motor deficit, sensory intact to light touch bilaterally.  Extremities : No Cyanosis,  Clubbing or Edema   Labs on Admission:  Results for orders placed during the hospital encounter of 12/22/12 (from the past 48 hour(s))  BASIC METABOLIC PANEL     Status: Abnormal   Collection Time    12/22/12  4:15 PM      Result Value Range   Sodium 137  135 - 145 mEq/L   Potassium 4.3  3.5 - 5.1 mEq/L   Chloride 103  96 - 112 mEq/L   CO2 25  19 - 32 mEq/L   Glucose, Bld 134 (*) 70  - 99 mg/dL   BUN 21  6 - 23 mg/dL   Creatinine, Ser 1.61 (*) 0.50 - 1.35 mg/dL   Calcium 9.5  8.4 - 09.6 mg/dL   GFR calc non Af Amer 41 (*) >90 mL/min   GFR calc Af Amer 47 (*) >90 mL/min   Comment: (NOTE)     The eGFR has been calculated using the CKD EPI equation.     This calculation has not been validated in all clinical situations.     eGFR's persistently <90 mL/min signify possible Chronic Kidney     Disease.  CBC WITH DIFFERENTIAL     Status: None   Collection Time    12/22/12  4:15 PM      Result Value Range   WBC 8.3  4.0 - 10.5 K/uL   RBC 4.86  4.22 - 5.81 MIL/uL   Hemoglobin 14.6  13.0 - 17.0 g/dL   HCT 04.5  40.9 - 81.1 %   MCV 92.8  78.0 - 100.0 fL   MCH 30.0  26.0 - 34.0 pg   MCHC 32.4  30.0 - 36.0 g/dL   RDW 91.4  78.2 - 95.6 %   Platelets 243  150 - 400 K/uL   Neutrophils Relative % 67  43 - 77 %   Neutro Abs 5.6  1.7 - 7.7 K/uL   Lymphocytes Relative 23  12 - 46 %   Lymphs Abs 1.9  0.7 - 4.0 K/uL   Monocytes Relative 7  3 - 12 %   Monocytes Absolute 0.6  0.1 - 1.0 K/uL   Eosinophils Relative 3  0 - 5 %   Eosinophils Absolute 0.3  0.0 - 0.7 K/uL   Basophils Relative 0  0 - 1 %   Basophils Absolute 0.0  0.0 - 0.1 K/uL  TROPONIN I     Status: None   Collection Time    12/22/12  4:15 PM      Result Value Range   Troponin I <0.30  <0.30 ng/mL   Comment:            Due to the release kinetics of cTnI,     a negative result within the first hours     of the onset of symptoms does not rule out     myocardial infarction with certainty.     If myocardial infarction is still suspected,     repeat the test at appropriate intervals.    Radiological Exams on Admission: Dg Chest Port 1 View  12/22/2012   CLINICAL DATA:  79 year old male with chest pain.  EXAM: PORTABLE CHEST - 1 VIEW  COMPARISON:  01/27/2009 and earlier.  FINDINGS: Semi portable AP upright view at 1705 hrs. Stable lung volumes. Normal cardiac size and mediastinal contours. Visualized tracheal air  column is within normal limits. Suggestion of mild increased interstitial opacity is felt related to technique. Allowing for portable radiographic technique, the  lungs are clear.  IMPRESSION: No active disease.   Electronically Signed   By: Augusto Gamble   On: 12/22/2012 17:24    Assessment/Plan Active Problems:   HYPERTENSION   CAD   Chest pain  Chest pain Patient has significant history of CAD and has coronary stent Will continue patient on aspirin, as per wife patient is unable to tolerate Plavix We'll cycle cardiac enzymes x3  CAD Continue aspirin, metoprolol.  Hypotension In the ED, patient's blood pressure dropped after he received nitroglycerin and morphine It has not improved with the IV fluids We'll continue with IV normosaline at 75 mL per hour  Hypertension Continue metoprolol.  Code status: Full code  Family discussion: Discussed with wife at bedside   Time Spent on Admission: 65 min  [facility] Pager: [phone] 12/22/2012, 7:23 PM  If 7PM-7AM, please contact night-coverage  [email or web address]  Password [email or web address]

## 2012-12-22 NOTE — ED Provider Notes (Signed)
CSN: [id number]     Arrival date & time 12/22/12  1550 History   First MD Initiated Contact with Patient 12/22/12 1603     Chief Complaint  Patient presents with  . Chest Pain    HPI Pt was seen at 1635. Per pt and his wife, c/o gradual onset and partial improvement in constant mid-sternal chest "pain" that began today PTA. Pt states he walked outside onto his porch when he felt "lightheaded," so he walked back into the house. States at that time he began to have mid-sternal chest "pain," which radiated into his left shoulder and arm. Was associated with generalized weakness, SOB, diaphoresis and nausea. Describes the CP as "like my heart pain," and "heaviness."  States he took his own SL ntg with partial relief of his symptoms. Denies palpitations, no cough, no abd pain, no vomiting/diarrhea, no back pain, no syncope, no focal motor weakness, no tingling/numbness in extremities.    Cards: Dr. Excell Seltzer Past Medical History  Diagnosis Date  . Hyperlipidemia   . Myocardial infarction   . CAD (coronary artery disease)   . DJD (degenerative joint disease)   . Macular degeneration    Past Surgical History  Procedure Laterality Date  . Cardiac catheterization    . Appendectomy     Family History  Problem Relation Age of Onset  . Heart failure     History  Substance Use Topics  . Smoking status: Former Smoker    Types: Cigarettes    Quit date: 04/23/1966  . Smokeless tobacco: Not on file  . Alcohol Use: No    Review of Systems ROS: Statement: All systems negative except as marked or noted in the HPI; Constitutional: Negative for fever and chills. ; ; Eyes: Negative for eye pain, redness and discharge. ; ; ENMT: Negative for ear pain, hoarseness, nasal congestion, sinus pressure and sore throat. ; ; Cardiovascular: +CP, SOB, diaphoresis. Negative for palpitations, and peripheral edema. ; ; Respiratory: Negative for cough, wheezing and stridor. ; ; Gastrointestinal: +nausea. Negative for  vomiting, diarrhea, abdominal pain, blood in stool, hematemesis, jaundice and rectal bleeding. . ; ; Genitourinary: Negative for dysuria, flank pain and hematuria. ; ; Musculoskeletal: Negative for back pain and neck pain. Negative for swelling and trauma.; ; Skin: Negative for pruritus, rash, abrasions, blisters, bruising and skin lesion.; ; Neuro: +lightheadedness, generalized weakness. Negative for headache and neck stiffness. Negative for altered level of consciousness , altered mental status, extremity weakness, paresthesias, involuntary movement, seizure and syncope.      Allergies  Atorvastatin and Simvastatin  Home Medications   Current Outpatient Rx  Name  Route  Sig  Dispense  Refill  . Ascorbic Acid (VITAMIN C) 500 MG tablet   Oral   Take 500 mg by mouth daily.           Marland Kitchen aspirin EC 81 MG tablet   Oral   Take 81 mg by mouth daily.         . Coenzyme Q10 (COQ10) 100 MG CAPS   Oral   Take 1 capsule by mouth daily.          . metoprolol (LOPRESSOR) 50 MG tablet   Oral   Take 25 mg by mouth 2 (two) times daily.         . nitroGLYCERIN (NITROSTAT) 0.4 MG SL tablet   Sublingual   Place 0.4 mg under the tongue every 5 (five) minutes as needed for chest pain.         Marland Kitchen  Red Yeast Rice 600 MG CAPS   Oral   Take 1 capsule by mouth 2 (two) times daily.            BP 102/60  Pulse 83  Temp(Src) 98.1 F (36.7 C) (Oral)  Resp 16  SpO2 98% Physical Exam 1640: Physical examination:  Nursing notes reviewed; Vital signs and O2 SAT reviewed;  Constitutional: Well developed, Well nourished, Well hydrated, In no acute distress; Head:  Normocephalic, atraumatic; Eyes: EOMI, PERRL, No scleral icterus; ENMT: Mouth and pharynx normal, Mucous membranes moist; Neck: Supple, Full range of motion, No lymphadenopathy; Cardiovascular: Regular rate and rhythm, No gallop; Respiratory: Breath sounds clear & equal bilaterally, No rales, rhonchi, wheezes.  Speaking full sentences with  ease, Normal respiratory effort/excursion; Chest: Nontender, Movement normal; Abdomen: Soft, Nontender, Nondistended, Normal bowel sounds; Genitourinary: No CVA tenderness; Extremities: Pulses normal, No tenderness, No edema, No calf edema or asymmetry.; Neuro: AA&Ox3, Major CN grossly intact. No facial droop. Speech clear. No gross focal motor or sensory deficits in extremities.; Skin: Color normal, Warm, Dry.   ED Course  Procedures     MDM  MDM Reviewed: previous chart, nursing note and vitals Reviewed previous: labs and ECG Interpretation: labs, ECG and x-ray     Date: 12/22/2012  Rate: 93  Rhythm: normal sinus rhythm  QRS Axis: normal  Intervals: PR prolonged  ST/T Wave abnormalities: normal  Conduction Disutrbances:first-degree A-V block  and right bundle branch block  Narrative Interpretation:   Old EKG Reviewed: unchanged; no significant changes from previous EKG dated 09/01/2012.   Results for orders placed during the hospital encounter of 12/22/12  BASIC METABOLIC PANEL      Result Value Range   Sodium 137  135 - 145 mEq/L   Potassium 4.3  3.5 - 5.1 mEq/L   Chloride 103  96 - 112 mEq/L   CO2 25  19 - 32 mEq/L   Glucose, Bld 134 (*) 70 - 99 mg/dL   BUN 21  6 - 23 mg/dL   Creatinine, Ser 1.61 (*) 0.50 - 1.35 mg/dL   Calcium 9.5  8.4 - 09.6 mg/dL   GFR calc non Af Amer 41 (*) >90 mL/min   GFR calc Af Amer 47 (*) >90 mL/min  CBC WITH DIFFERENTIAL      Result Value Range   WBC 8.3  4.0 - 10.5 K/uL   RBC 4.86  4.22 - 5.81 MIL/uL   Hemoglobin 14.6  13.0 - 17.0 g/dL   HCT 04.5  40.9 - 81.1 %   MCV 92.8  78.0 - 100.0 fL   MCH 30.0  26.0 - 34.0 pg   MCHC 32.4  30.0 - 36.0 g/dL   RDW 91.4  78.2 - 95.6 %   Platelets 243  150 - 400 K/uL   Neutrophils Relative % 67  43 - 77 %   Neutro Abs 5.6  1.7 - 7.7 K/uL   Lymphocytes Relative 23  12 - 46 %   Lymphs Abs 1.9  0.7 - 4.0 K/uL   Monocytes Relative 7  3 - 12 %   Monocytes Absolute 0.6  0.1 - 1.0 K/uL   Eosinophils  Relative 3  0 - 5 %   Eosinophils Absolute 0.3  0.0 - 0.7 K/uL   Basophils Relative 0  0 - 1 %   Basophils Absolute 0.0  0.0 - 0.1 K/uL  TROPONIN I      Result Value Range   Troponin I <0.30  <0.30 ng/mL  Dg Chest Port 1 View 12/22/2012   CLINICAL DATA:  79 year old male with chest pain.  EXAM: PORTABLE CHEST - 1 VIEW  COMPARISON:  01/27/2009 and earlier.  FINDINGS: Semi portable AP upright view at 1705 hrs. Stable lung volumes. Normal cardiac size and mediastinal contours. Visualized tracheal air column is within normal limits. Suggestion of mild increased interstitial opacity is felt related to technique. Allowing for portable radiographic technique, the lungs are clear.  IMPRESSION: No active disease.   Electronically Signed   By: Augusto Gamble   On: 12/22/2012 17:24   Results for David Collier, David Collier (MRN [id number]) as of 12/22/2012 19:01  Ref. Range 01/27/2009 01:08 01/27/2009 06:45 01/28/2009 03:45 09/01/2012 11:24 12/22/2012 16:15  BUN Latest Range: 6-23 mg/dL 28 (H) 30 (H) 22 21 21   Creatinine Latest Range: 0.50-1.35 mg/dL 7.82 9.56 2.13 1.4 0.86 (H)    1905:  Pt's symptoms improved after ASA, SL ntg and morphine while in the ED. SBP dropped after ntg and morphine; improved with judicious IVF. Pt with significant cardiac hx.  Dx and testing d/w pt and family.  Questions answered.  Verb understanding, agreeable to admit. T/C to [facility] Dr. Sharl Ma, case discussed, including:  HPI, pertinent PM/SHx, VS/PE, dx testing, ED course and treatment:  Agreeable to observation admit, requests to write temporary orders, obtain tele bed to team 2.   Laray Anger, DO 12/24/12 [place]

## 2012-12-22 NOTE — ED Notes (Signed)
Pt c/o left side chest pain that radiates to left shoulder x1 hour. Pt states he walked outside and became dizzy and felt like he was going to pass out. Pt then went inside and sat down which is when the chest discomfort and other symptoms began. Pt reports diaphoresis, SOB, dizziness, lightheadedness, weakness, and nausea. Pt has significant cardiac hx. Pt states he took 1 nitroglycerin tablet pta with temporary relief.

## 2012-12-23 ENCOUNTER — Encounter (HOSPITAL_COMMUNITY): Payer: Self-pay | Admitting: Cardiology

## 2012-12-23 DIAGNOSIS — N179 Acute kidney failure, unspecified: Secondary | ICD-10-CM | POA: Diagnosis not present

## 2012-12-23 DIAGNOSIS — I2510 Atherosclerotic heart disease of native coronary artery without angina pectoris: Secondary | ICD-10-CM

## 2012-12-23 LAB — URINALYSIS W MICROSCOPIC + REFLEX CULTURE
Bilirubin Urine: NEGATIVE
Ketones, ur: NEGATIVE mg/dL
Nitrite: NEGATIVE
Specific Gravity, Urine: 1.02 (ref 1.005–1.030)
Urobilinogen, UA: 0.2 mg/dL (ref 0.0–1.0)

## 2012-12-23 LAB — BASIC METABOLIC PANEL
BUN: 23 mg/dL (ref 6–23)
Chloride: 106 mEq/L (ref 96–112)
Glucose, Bld: 88 mg/dL (ref 70–99)
Potassium: 4.4 mEq/L (ref 3.5–5.1)

## 2012-12-23 LAB — TROPONIN I
Troponin I: <0.3 ng/mL (ref <0.30)
Troponin I: <0.3 ng/mL (ref <0.30)

## 2012-12-23 NOTE — Progress Notes (Signed)
UR chart review completed.  

## 2012-12-23 NOTE — Discharge Summary (Signed)
I have seen and examined this patient and I agree with the above assessment and plan.  Kenyatta Keidel, MD [facility] [phone]  

## 2012-12-23 NOTE — Discharge Summary (Signed)
Physician Discharge Summary  David Collier ZOX:[id number] DOB: 1929/04/14 DOA: 12/22/2012  PCP: Cassell Smiles., MD  Admit date: 12/22/2012 Discharge date: 12/23/2012  Time spent: 40 minutes  Recommendations for Outpatient Follow-up:  1. Dr. Eden Emms 01/07/13 for evaluation of symptoms and need for further workup 2. Dr. Sherwood Gambler PCP 1 week for tracking of renal function. Recommend BMET  Discharge Diagnoses:  Active Problems:   HYPERTENSION   CAD   Chest pain   Acute renal failure   Discharge Condition: stable  Diet recommendation: heart healthy  Filed Weights   12/22/12 2107  Weight: 93.2 kg (205 lb 7.5 oz)    History of present illness:  79 year old male with a history of CAD, status post stent restenosis requiring balloon angioplasty in 2010 and followed by cardiology developed severe chest pain on 12/22/12 while he walked outside onto his porch when he felt lightheaded and started having chest pain which was midsternal and was radiating to the left arm, this was associated with nausea but no shortness of breath. The pain was persistent, also he had diaphoresis. Patient took nitroglycerin with some partial relief, but then the pain came back and so patient was brought to the ED. In the ED patient was pain-free  EKG showed first degree AV block and right bundle branch block.   Hospital Course:  Chest pain: atypical. Admitted to tel. Patient with significant history of CAD and has coronary stent. Denied any further CP episodes. Troponin neg x3. EKG non-acute. Evaluated by cardiology who opined if no further symptoms with ambulation, OP myoview could be considered on follow up. Pt to see Dr. Eden Emms 01/07/13. Continue home BB and aspirin. as per wife patient is unable to tolerate Plavix. Appreciate cardiology assistance.  Acute renal failure: likely related to hypotension yesterday. Gently hydrated with IV fluids. Urine output good. Recommend bmet 1 week with PCP follow up.   CAD  Continue  aspirin, metoprolol. No further CP. See #1. Hypotension  In the ED, patient's blood pressure dropped after he received nitroglycerin and morphine . Provided with IV fluids and improved. At discharge BP 131/79.  Hypertension  Continue metoprolol. See #3.   Procedures: none Consultations:  cardiology  Discharge Exam: Filed Vitals:   12/23/12 0421  BP: 131/79  Pulse: 59  Temp: 98.1 F (36.7 C)  Resp: 20    General: well nourished NAD Cardiovascular: RRR No MGR No LE edema Respiratory: normal effort BS clear to ausculation bilaterally no wheeze Abdomen: soft +BS non-tender to palpation  Discharge Instructions   Future Appointments Provider Department Dept Phone   01/07/2013 10:30 AM Rosalio Macadamia, NP [facility] Main Office [place]) [phone]       Medication List         aspirin EC 81 MG tablet  Take 81 mg by mouth daily.     CoQ10 100 MG Caps  Take 1 capsule by mouth daily.     metoprolol 50 MG tablet  Commonly known as:  LOPRESSOR  Take 25 mg by mouth 2 (two) times daily.     nitroGLYCERIN 0.4 MG SL tablet  Commonly known as:  NITROSTAT  Place 0.4 mg under the tongue every 5 (five) minutes as needed for chest pain.     Red Yeast Rice 600 MG Caps  Take 1 capsule by mouth 2 (two) times daily.     vitamin C 500 MG tablet  Commonly known as:  ASCORBIC ACID  Take 500 mg by mouth daily.  Allergies  Allergen Reactions  . Atorvastatin   . Simvastatin        Follow-up Information   Follow up with Charlton Haws, MD On 01/07/2013. (Appointment scheduled for [facility]. Appointment time is 10:30 AM)    Specialty:  Cardiology   Contact information:   [address] [place] [address] [phone]        The results of significant diagnostics from this hospitalization (including imaging, microbiology, ancillary and laboratory) are listed below for reference.    Significant Diagnostic Studies: Dg Chest Port 1  View  12/22/2012   CLINICAL DATA:  79 year old male with chest pain.  EXAM: PORTABLE CHEST - 1 VIEW  COMPARISON:  01/27/2009 and earlier.  FINDINGS: Semi portable AP upright view at 1705 hrs. Stable lung volumes. Normal cardiac size and mediastinal contours. Visualized tracheal air column is within normal limits. Suggestion of mild increased interstitial opacity is felt related to technique. Allowing for portable radiographic technique, the lungs are clear.  IMPRESSION: No active disease.   Electronically Signed   By: Augusto Gamble   On: 12/22/2012 17:24    Microbiology: No results found for this or any previous visit (from the past 240 hour(s)).   Labs: Basic Metabolic Panel:  Recent Labs Lab 12/22/12 1615 12/23/12 0741  NA 137 140  K 4.3 4.4  CL 103 106  CO2 25 28  GLUCOSE 134* 88  BUN 21 23  CREATININE 1.53* 1.68*  CALCIUM 9.5 8.9   Liver Function Tests: No results found for this basename: AST, ALT, ALKPHOS, BILITOT, PROT, ALBUMIN,  in the last 168 hours No results found for this basename: LIPASE, AMYLASE,  in the last 168 hours No results found for this basename: AMMONIA,  in the last 168 hours CBC:  Recent Labs Lab 12/22/12 1615  WBC 8.3  NEUTROABS 5.6  HGB 14.6  HCT 45.1  MCV 92.8  PLT 243   Cardiac Enzymes:  Recent Labs Lab 12/22/12 1615 12/22/12 2012 12/23/12 0133 12/23/12 0741  TROPONINI <0.30 <0.30 <0.30 <0.30   BNP: BNP (last 3 results) No results found for this basename: PROBNP,  in the last 8760 hours CBG: No results found for this basename: GLUCAP,  in the last 168 hours     Signed:  Gwenyth Bender  [facility] 12/23/2012, 1:19 PM

## 2012-12-23 NOTE — Progress Notes (Signed)
Patient and wife state understanding of discharge instructions 

## 2012-12-23 NOTE — Care Management Note (Addendum)
    Page 1 of 1   12/23/2012     2:04:39 PM   CARE MANAGEMENT NOTE 12/23/2012  Patient:  David Collier, David Collier   Account Number:  [id number]  Date Initiated:  12/23/2012  Documentation initiated by:  Sharrie Rothman  Subjective/Objective Assessment:   Pt admitted from home with CP.  Pt lives with his wife and will return home at discharge. Pt uses a cane for outdoors only. Pt is fairly independent with ADL's.     Action/Plan:   No CM needs noted.   Anticipated DC Date:  12/24/2012   Anticipated DC Plan:  HOME/SELF CARE      DC Planning Services  CM consult      Choice offered to / List presented to:             Status of service:  Completed, signed off Medicare Important Message given?   (If response is "NO", the following Medicare IM given date fields will be blank) Date Medicare IM given:   Date Additional Medicare IM given:    Discharge Disposition:  HOME/SELF CARE  Per UR Regulation:    If discussed at Long Length of Stay Meetings, dates discussed:    Comments:  12/23/12 1400 Arlyss Queen, RN BSN CM Pt discharged home today. No CM needs noted.  12/23/12 1115 Arlyss Queen, RN BSN CM

## 2012-12-23 NOTE — Consult Note (Signed)
Primary cardiologist: Dr. Tonny Bollman  Clinical Summary David Collier is an 79 y.o.male admitted to the hospital after a recent episode of lightheadedness and possibly angina. He states that he walked out onto his deck to check on his wife's dog and all and suddenly felt unusual. May have taken a nitroglycerin around that time. Reports feeling completely at baseline now. He is somewhat of a limited historian, but does not provide any history of accelerating exertional angina. His wife corroborates this. 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  He was most recently seen in the office by Dr. Excell Seltzer in May, that time clinically stable on medical therapy. No followup ischemic testing since last cardiac catheterization 2010.  ECG shows sinus rhythm with prolonged PR and right bundle branch block.   Allergies  Allergen Reactions  . Atorvastatin   . Simvastatin     Medications Scheduled Medications: . aspirin EC  81 mg Oral Daily  . enoxaparin (LOVENOX) injection  40 mg Subcutaneous Q24H  . metoprolol  25 mg Oral BID     Infusions: . sodium chloride 75 mL/hr at 12/22/12 1742     PRN Medications:  morphine injection, nitroGLYCERIN, ondansetron (ZOFRAN) IV, ondansetron   Past Medical History  Diagnosis Date  . Hyperlipidemia   . Myocardial infarction   . Coronary atherosclerosis of native coronary artery     BMS circumflex and OM 2006, PTCA ISR 2010, moderate LAD disease  . DJD (degenerative joint disease)   . Macular degeneration     Past Surgical History  Procedure Laterality Date  . Appendectomy      Family History  Problem Relation Age of Onset  . Heart failure      Social History David Collier reports that he quit smoking about 46 years ago. His smoking use included Cigarettes. He smoked 0.00 packs per day. He does not have any smokeless tobacco history on file. David Collier reports that he does not drink alcohol.  Review of Systems No palpitations or  syncope. No orthopnea or PND. Reports compliance with his medications. No claudication. Otherwise negative.  Physical Examination Blood pressure 131/79, pulse 59, temperature 98.1 F (36.7 C), temperature source Oral, resp. rate 20, height 5\' 9"  (1.753 m), weight 205 lb 7.5 oz (93.2 kg), SpO2 99.00%.  Intake/Output Summary (Last 24 hours) at 12/23/12 1117 Last data filed at 12/23/12 0600  Gross per 24 hour  Intake  922.5 ml  Output    350 ml  Net  572.5 ml    Comfortable at rest. HEENT: Conjunctiva and lids normal, oropharynx clear. Neck: Supple, no elevated JVP, left carotid bruit, no thyromegaly. Lungs: Clear to auscultation, nonlabored breathing at rest. Cardiac: Regular rate and rhythm, no S3 or significant systolic murmur, no pericardial rub. Abdomen: Soft, nontender, bowel sounds present. Extremities: No pitting edema, distal pulses 2+. Skin: Warm and dry. Musculoskeletal: No kyphosis. Neuropsychiatric: Alert and oriented x3, affect grossly appropriate.   Lab Results Basic Metabolic Panel:  Recent Labs Lab 12/22/12 1615 12/23/12 0741  NA 137 140  K 4.3 4.4  CL 103 106  CO2 25 28  GLUCOSE 134* 88  BUN 21 23  CREATININE 1.53* 1.68*  CALCIUM 9.5 8.9    CBC:  Recent Labs Lab 12/22/12 1615  WBC 8.3  NEUTROABS 5.6  HGB 14.6  HCT 45.1  MCV 92.8  PLT 243    Cardiac Enzymes:  Recent Labs Lab 12/22/12 1615 12/22/12 2012 12/23/12 0133 12/23/12 0741  TROPONINI <0.30 <0.30 <0.30 <0.30  Imaging PORTABLE CHEST - 1 VIEW  COMPARISON: 01/27/2009 and earlier.  FINDINGS: Semi portable AP upright view at 1705 hrs. Stable lung volumes. Normal cardiac size and mediastinal contours. Visualized tracheal air column is within normal limits. Suggestion of mild increased interstitial opacity is felt related to technique. Allowing for portable radiographic technique, the lungs are clear.  IMPRESSION: No active disease.   Impression  1. Recent episode of  lightheadedness and possibly angina. Patient has not reported accelerating pattern however. Cardiac markers are normal. ECG is nonacute.  2. Known CAD with history of BMS to the circumflex/OM 2006 with subsequent PTCA due to ISR 2010. No followup ischemic testing since that time.  3. Hyperlipidemia. On red yeast rice.  4. Left carotid bruit, also noted in Dr. Earmon Phoenix note from May, being followed conservatively at this time.  Recommendations  Discussed with patient and wife. I asked him to ambulate some in the hall. If he has no recurrent symptoms similar to presentation, expect that he could be seen in the office as an outpatient by Dr. Excell Seltzer for consideration as to whether a followup Myoview may be of use in guiding further treatment, versus considering medication adjustments for management of angina.   Jonelle Sidle, M.D., F.A.C.C.

## 2012-12-23 NOTE — Progress Notes (Signed)
Patient ambulated in hallway without c/o chest pain, respirations remained even and unlabored.

## 2012-12-23 NOTE — Progress Notes (Signed)
[facility] PROGRESS NOTE  AARIK BLANK WUJ:[id number] DOB: 18-Dec-1928 DOA: 12/22/2012 PCP: Cassell Smiles., MD  Assessment/Plan:  Chest pain: atypical. Patient has significant history of CAD and has coronary stent. Wife reports pt has been increasing activity lately and yesterday walked around his 1 acre property twice. Of note, pt with short term memory deficit so information may be unreliable.  Denies any further CP episodes. Troponin neg to date. Continue home BB with parameters. Will continue patient on aspirin, as per wife patient is unable to tolerate Plavix. Will await cardiology input.   Acute renal failure: likely related to hypotension yesterday. Will recheck this am. Expect improvement with gently IV hydration and improved BP.   CAD  Continue aspirin, metoprolol. No further CP.  Hypotension  In the ED, patient's blood pressure dropped after he received nitroglycerin and morphine . Provided with IV fluids and improved this am. Will decrease IV rate somewhat. Continue BB with parameters   Hypertension  Continue metoprolol. See #3.   Code Status: full Family Communication: wife at bedside Disposition Plan: home when ready   Consultants:  cardiology  Procedures:  none  Antibiotics:  none  HPI/Subjective: Sitting on side of bed eating breakfast. Denies pain discomfort.  Objective: Filed Vitals:   12/23/12 0421  BP: 131/79  Pulse: 59  Temp: 98.1 F (36.7 C)  Resp: 20    Intake/Output Summary (Last 24 hours) at 12/23/12 0843 Last data filed at 12/23/12 0600  Gross per 24 hour  Intake  922.5 ml  Output    350 ml  Net  572.5 ml   Filed Weights   12/22/12 2107  Weight: 93.2 kg (205 lb 7.5 oz)    Exam:   General:  Well nourished NAD  Cardiovascular: RRR No MGR No LE edema  Respiratory: normal effort BS clear bilaterally no wheeze no rhonchi  Abdomen: soft non-distended +BS x4. Non-tender to palpation  Musculoskeletal: MAE. No  clubbing no cyanosis   Data Reviewed: Basic Metabolic Panel:  Recent Labs Lab 12/22/12 1615  NA 137  K 4.3  CL 103  CO2 25  GLUCOSE 134*  BUN 21  CREATININE 1.53*  CALCIUM 9.5   Liver Function Tests: No results found for this basename: AST, ALT, ALKPHOS, BILITOT, PROT, ALBUMIN,  in the last 168 hours No results found for this basename: LIPASE, AMYLASE,  in the last 168 hours No results found for this basename: AMMONIA,  in the last 168 hours CBC:  Recent Labs Lab 12/22/12 1615  WBC 8.3  NEUTROABS 5.6  HGB 14.6  HCT 45.1  MCV 92.8  PLT 243   Cardiac Enzymes:  Recent Labs Lab 12/22/12 1615 12/22/12 2012 12/23/12 0133 12/23/12 0741  TROPONINI <0.30 <0.30 <0.30 <0.30   Studies: Dg Chest Port 1 View  12/22/2012   CLINICAL DATA:  79 year old male with chest pain.  EXAM: PORTABLE CHEST - 1 VIEW  COMPARISON:  01/27/2009 and earlier.  FINDINGS: Semi portable AP upright view at 1705 hrs. Stable lung volumes. Normal cardiac size and mediastinal contours. Visualized tracheal air column is within normal limits. Suggestion of mild increased interstitial opacity is felt related to technique. Allowing for portable radiographic technique, the lungs are clear.  IMPRESSION: No active disease.   Electronically Signed   By: Augusto Gamble   On: 12/22/2012 17:24    Scheduled Meds: . aspirin EC  81 mg Oral Daily  . enoxaparin (LOVENOX) injection  40 mg Subcutaneous Q24H  . metoprolol  25 mg Oral  BID   Continuous Infusions: . sodium chloride 75 mL/hr at 12/22/12 1742    Active Problems:   HYPERTENSION   CAD   Chest pain  Time spent: 35 minutes  [facility] M  [facility] Pager [phone]. If 7PM-7AM, please contact night-coverage at [email or web address], password [facility] 12/23/2012, 8:43 AM  LOS: 1 day   I have seen and examined this patient and I agree with the above assessment and plan.  Pamella Pert, MD [facility] [phone]

## 2012-12-30 ENCOUNTER — Encounter: Payer: Self-pay | Admitting: Cardiovascular Disease

## 2012-12-30 ENCOUNTER — Ambulatory Visit (INDEPENDENT_AMBULATORY_CARE_PROVIDER_SITE_OTHER): Payer: Medicare Other | Admitting: Cardiovascular Disease

## 2012-12-30 VITALS — BP 130/68 | HR 57 | Ht 69.0 in | Wt 206.8 lb

## 2012-12-30 DIAGNOSIS — R55 Syncope and collapse: Secondary | ICD-10-CM

## 2012-12-30 DIAGNOSIS — R079 Chest pain, unspecified: Secondary | ICD-10-CM

## 2012-12-30 DIAGNOSIS — I2510 Atherosclerotic heart disease of native coronary artery without angina pectoris: Secondary | ICD-10-CM

## 2012-12-30 NOTE — Patient Instructions (Signed)
Your physician has requested that you have a lexiscan myoview. For further information please visit [email or web address] Please follow instruction sheet, as given.  Your physician has recommended that you wear an event monitor. Event monitors are medical devices that record the heart's electrical activity. Doctors most often [place] these monitors to diagnose arrhythmias. Arrhythmias are problems with the speed or rhythm of the heartbeat. The monitor is a small, portable device. You can wear one while you do your normal daily activities. This is usually used to diagnose what is causing palpitations/syncope (passing out).  Your physician recommends that you continue on your current medications as directed. Please refer to the Current Medication list given to you today.  Your physician wants you to follow-up in: 6 months with Dr. Excell Seltzer. You will receive a reminder letter in the mail two months in advance. If you don't receive a letter, please call our office to schedule the follow-up appointment.

## 2012-12-30 NOTE — Progress Notes (Signed)
HPI:  79 year old gentleman presenting for hospital followup evaluation. The patient is followed for coronary artery disease. He has undergone stenting of the left circumflex and obtuse marginal branches. His last PCI procedure was in 2010 when he underwent cutting balloon angioplasty for treatment of severe in-stent restenosis. At that time he was noted to have moderate 50-60% LAD stenosis which was unchanged from his previous catheterization study from 2003. He has been managed medically. His left ventricular ejection fraction has been preserved.  The patient was hospitalized overnight on September 2. He had an episode of presyncope. He's had a second episode since that time. He's had episodic chest pain, without clear relationship to physical exertion. He denies shortness of breath. Overall he feels weaker than in the past. He also complains of a feeling of "anxiety." He denies edema, orthopnea, or PND. He is unable to describe his chest pain. Does not radiate into the neck, back, or jaw appear  Outpatient Encounter Prescriptions as of 12/30/2012  Medication Sig Dispense Refill  . Ascorbic Acid (VITAMIN C) 500 MG tablet Take 500 mg by mouth daily.        Marland Kitchen aspirin EC 81 MG tablet Take 81 mg by mouth daily.      . Coenzyme Q10 (COQ10) 100 MG CAPS Take 1 capsule by mouth daily.       . metoprolol (LOPRESSOR) 50 MG tablet Take 25 mg by mouth 2 (two) times daily.      . nitroGLYCERIN (NITROSTAT) 0.4 MG SL tablet Place 0.4 mg under the tongue every 5 (five) minutes as needed for chest pain.      . Red Yeast Rice 600 MG CAPS Take 1 capsule by mouth 2 (two) times daily.         No facility-administered encounter medications on file as of 12/30/2012.    Allergies  Allergen Reactions  . Atorvastatin   . Simvastatin     Past Medical History  Diagnosis Date  . Hyperlipidemia   . Myocardial infarction   . Coronary atherosclerosis of native coronary artery     BMS circumflex and OM 2006, PTCA ISR  2010, moderate LAD disease  . DJD (degenerative joint disease)   . Macular degeneration     ROS: Negative except as per HPI  BP 130/68  Pulse 57  Ht 5\' 9"  (1.753 m)  Wt 206 lb 12.8 oz (93.804 kg)  BMI 30.53 kg/m2  PHYSICAL EXAM: Pt is alert and oriented, elderly gentleman in NAD HEENT: normal Neck: JVP - normal, carotids 2+= with a left carotid bruit bruits Lungs: CTA bilaterally CV: RRR without murmur or gallop Abd: soft, NT, Positive BS, no hepatomegaly Ext: no C/C/E, distal pulses intact and equal Skin: warm/dry no rash  EKG:  Sinus bradycardia 57 beats per minute, first degree AV block, right bundle branch block.  ASSESSMENT AND PLAN: 1. Coronary artery disease, native vessel. The patient's previous cardiac cath studies were reviewed. His recent symptoms are concerning for cardiac ischemia. I have recommended a Lexiscan Myoview stress test for further evaluation. There are some typical and atypical features to his history. However, I am somewhat concerned that his symptoms are progressive.  2. Presyncope. The patient has had 2 episodes of presyncope and has significant conduction disease with right bundle branch block and first degree AV block on his EKG. I recommended a 3 week event recorder.  3. Hyperlipidemia. The patient is statin intolerant. He remains on red yeast rice.  4. Carotid bruit. With presyncopal episodes,  should do a carotid duplex when he returns for his stress test.  Tonny Bollman 12/30/2012 6:38 PM

## 2013-01-07 ENCOUNTER — Encounter: Payer: Medicare Other | Admitting: Nurse Practitioner

## 2013-01-14 ENCOUNTER — Encounter (HOSPITAL_COMMUNITY): Payer: Medicare Other

## 2013-04-26 ENCOUNTER — Emergency Department (HOSPITAL_COMMUNITY): Payer: Medicare Other

## 2013-04-26 ENCOUNTER — Encounter (HOSPITAL_COMMUNITY): Payer: Self-pay | Admitting: Emergency Medicine

## 2013-04-26 ENCOUNTER — Emergency Department (HOSPITAL_COMMUNITY)
Admission: EM | Admit: 2013-04-26 | Discharge: 2013-04-26 | Disposition: A | Discharge status: Home / Self Care | Payer: Medicare Other | Attending: Emergency Medicine | Admitting: Emergency Medicine

## 2013-04-26 DIAGNOSIS — Z8639 Personal history of other endocrine, nutritional and metabolic disease: Secondary | ICD-10-CM | POA: Insufficient documentation

## 2013-04-26 DIAGNOSIS — Z87891 Personal history of nicotine dependence: Secondary | ICD-10-CM | POA: Insufficient documentation

## 2013-04-26 DIAGNOSIS — I209 Angina pectoris, unspecified: Secondary | ICD-10-CM | POA: Insufficient documentation

## 2013-04-26 DIAGNOSIS — Z7982 Long term (current) use of aspirin: Secondary | ICD-10-CM | POA: Insufficient documentation

## 2013-04-26 DIAGNOSIS — R51 Headache: Secondary | ICD-10-CM | POA: Insufficient documentation

## 2013-04-26 DIAGNOSIS — M5090 Cervical disc disorder, unspecified, unspecified cervical region: Secondary | ICD-10-CM

## 2013-04-26 DIAGNOSIS — H5316 Psychophysical visual disturbances: Secondary | ICD-10-CM | POA: Insufficient documentation

## 2013-04-26 DIAGNOSIS — R4182 Altered mental status, unspecified: Secondary | ICD-10-CM | POA: Insufficient documentation

## 2013-04-26 DIAGNOSIS — R519 Headache, unspecified: Secondary | ICD-10-CM

## 2013-04-26 DIAGNOSIS — I498 Other specified cardiac arrhythmias: Secondary | ICD-10-CM | POA: Insufficient documentation

## 2013-04-26 DIAGNOSIS — Z862 Personal history of diseases of the blood and blood-forming organs and certain disorders involving the immune mechanism: Secondary | ICD-10-CM | POA: Insufficient documentation

## 2013-04-26 DIAGNOSIS — Z79899 Other long term (current) drug therapy: Secondary | ICD-10-CM | POA: Insufficient documentation

## 2013-04-26 DIAGNOSIS — I208 Other forms of angina pectoris: Secondary | ICD-10-CM

## 2013-04-26 DIAGNOSIS — I252 Old myocardial infarction: Secondary | ICD-10-CM | POA: Insufficient documentation

## 2013-04-26 DIAGNOSIS — G479 Sleep disorder, unspecified: Secondary | ICD-10-CM | POA: Insufficient documentation

## 2013-04-26 DIAGNOSIS — I10 Essential (primary) hypertension: Secondary | ICD-10-CM | POA: Insufficient documentation

## 2013-04-26 DIAGNOSIS — I2510 Atherosclerotic heart disease of native coronary artery without angina pectoris: Secondary | ICD-10-CM | POA: Insufficient documentation

## 2013-04-26 DIAGNOSIS — R443 Hallucinations, unspecified: Secondary | ICD-10-CM

## 2013-04-26 DIAGNOSIS — M5030 Other cervical disc degeneration, unspecified cervical region: Secondary | ICD-10-CM | POA: Insufficient documentation

## 2013-04-26 LAB — POCT I-STAT TROPONIN I
Troponin i, poc: 0 ng/mL (ref 0.00–0.08)
Troponin i, poc: 0 ng/mL (ref 0.00–0.08)

## 2013-04-26 LAB — CBC
HEMATOCRIT: 44.5 % (ref 39.0–52.0)
Hemoglobin: 14.6 g/dL (ref 13.0–17.0)
MCH: 31.3 pg (ref 26.0–34.0)
MCHC: 32.8 g/dL (ref 30.0–36.0)
MCV: 95.5 fL (ref 78.0–100.0)
Platelets: 203 10*3/uL (ref 150–400)
RBC: 4.66 MIL/uL (ref 4.22–5.81)
RDW: 13.5 % (ref 11.5–15.5)
WBC: 8.5 10*3/uL (ref 4.0–10.5)

## 2013-04-26 LAB — URINALYSIS, ROUTINE W REFLEX MICROSCOPIC
BILIRUBIN URINE: NEGATIVE
Glucose, UA: NEGATIVE mg/dL
HGB URINE DIPSTICK: NEGATIVE
KETONES UR: NEGATIVE mg/dL
Leukocytes, UA: NEGATIVE
Nitrite: NEGATIVE
Protein, ur: NEGATIVE mg/dL
Specific Gravity, Urine: 1.01 (ref 1.005–1.030)
UROBILINOGEN UA: 0.2 mg/dL (ref 0.0–1.0)
pH: 5 (ref 5.0–8.0)

## 2013-04-26 LAB — BASIC METABOLIC PANEL
BUN: 24 mg/dL — AB (ref 6–23)
CHLORIDE: 102 meq/L (ref 96–112)
CO2: 25 mEq/L (ref 19–32)
CREATININE: 1.5 mg/dL — AB (ref 0.50–1.35)
Calcium: 9.1 mg/dL (ref 8.4–10.5)
GFR calc Af Amer: 48 mL/min — ABNORMAL LOW (ref >90)
GFR calc non Af Amer: 42 mL/min — ABNORMAL LOW (ref >90)
Glucose, Bld: 81 mg/dL (ref 70–99)
Potassium: 4.9 mEq/L (ref 3.7–5.3)
Sodium: 140 mEq/L (ref 137–147)

## 2013-04-26 LAB — PRO B NATRIURETIC PEPTIDE: Pro B Natriuretic peptide (BNP): 346.5 pg/mL (ref 0–450)

## 2013-04-26 MED ORDER — DIAZEPAM 5 MG PO TABS: 5.0000 mg | ORAL_TABLET | Freq: Two times a day (BID) | ORAL | Status: DC | PRN

## 2013-04-26 NOTE — ED Notes (Signed)
Pt and family member reports multiple complaints. Having headache to back of head for months, pain has gotten worse. Has MRI scheduled for tomorrow. Wife reports periods of confusion x 2 weeks and intermittent chest pains, had episode of cp this am which was relieved pta. ekg done at triage.

## 2013-04-26 NOTE — ED Notes (Signed)
Family reports pt c/o intermittent h/a x 6 months sometimes relieved with ibuprofen. Denies dizziness, n/v, numbness, tingling, photophobia with h/a's. States is going to the [place] tomorrow to have an MRI of head. Pt also c/o episode CP yesterday & this morning which was relieved with NTG. Denied n/v, SOB, diaphoresis with CP. Wife states onset CP yesterday was when pt got angry.  Family also reports worsening short term memory & confusion especially at night. Pt presently denies any pain, able to answer all questions appropriately.

## 2013-04-26 NOTE — ED Notes (Signed)
Patient transported to CT/xray 

## 2013-04-26 NOTE — ED Provider Notes (Addendum)
CSN: [id number]     Arrival date & time 04/26/13  1139 History   First MD Initiated Contact with Patient 04/26/13 1228     Chief Complaint  Patient presents with  . Chest Pain  . Headache  . Altered Mental Status   (Consider location/radiation/quality/duration/timing/severity/associated sxs/prior Treatment) HPI Comments: Secondly the reason family brought the patient is because of the last 41months he has had headaches that start in the back of his neck and travel to the back of his head.  They usually improve with ibuprofen but seem to be getting worse.  Family states they were planning on going to the [place] tomorrow for MRI but since the pain seemed really bad today then came here.  Pt received ibuprofen prior to arrival and now states the HA is gone.  Denies numbness, weakness or radicular sx.   Also wife states over the last 3 weeks pt has been having visual hallucinations, forgetting where he is at and not recognizing his own home at night.  She states he is not sleeping at night and seems to roam all night.  However he seems to be fine during the day.  No new medications or change in medicines.  Patient is a 80 y.o. male presenting with chest pain. The history is provided by the spouse.  Chest Pain Pain location:  Substernal area Pain quality: pressure   Pain radiates to:  Does not radiate Pain radiates to the back: no   Pain severity:  Moderate Onset quality:  Sudden Duration:  5 minutes (episode of pain yesterday and today that were brief) Timing:  Intermittent Progression:  Resolved Chronicity:  Recurrent Context comment:  States both times the pain started after he got mad Relieved by:  Nitroglycerin (too nitro yesterday and pain relieved and relieved spontaneously today) Exacerbated by: when he gets mad. Ineffective treatments:  None tried Associated symptoms: diaphoresis and shortness of breath   Associated symptoms: no abdominal pain, no back pain, no cough, no nausea, no  numbness, not vomiting and no weakness   Risk factors: coronary artery disease and hypertension   Risk factors: no diabetes mellitus     Past Medical History  Diagnosis Date  . Hyperlipidemia   . Myocardial infarction   . Coronary atherosclerosis of native coronary artery     BMS circumflex and OM 2006, PTCA ISR 2010, moderate LAD disease  . DJD (degenerative joint disease)   . Macular degeneration    Past Surgical History  Procedure Laterality Date  . Appendectomy     Family History  Problem Relation Age of Onset  . Heart failure     History  Substance Use Topics  . Smoking status: Former Smoker    Types: Cigarettes    Quit date: 04/23/1966  . Smokeless tobacco: Not on file  . Alcohol Use: No    Review of Systems  Constitutional: Positive for diaphoresis.  Respiratory: Positive for shortness of breath. Negative for cough.   Cardiovascular: Positive for chest pain.  Gastrointestinal: Negative for nausea, vomiting and abdominal pain.  Musculoskeletal: Negative for back pain.  Neurological: Negative for speech difficulty, weakness and numbness.  Psychiatric/Behavioral: Positive for hallucinations, confusion and sleep disturbance.  All other systems reviewed and are negative.    Allergies  Atorvastatin and Simvastatin  Home Medications   Current Outpatient Rx  Name  Route  Sig  Dispense  Refill  . Ascorbic Acid (VITAMIN C) 500 MG tablet   Oral   Take 500 mg by mouth  daily.           . aspirin EC 81 MG tablet   Oral   Take 81 mg by mouth daily.         . Coenzyme Q10 (COQ10) 100 MG CAPS   Oral   Take 1 capsule by mouth daily.          Marland Kitchen ibuprofen (ADVIL,MOTRIN) 200 MG tablet   Oral   Take 200-400 mg by mouth every 6 (six) hours as needed.         . metoprolol (LOPRESSOR) 50 MG tablet   Oral   Take 25 mg by mouth 2 (two) times daily.         . nitroGLYCERIN (NITROSTAT) 0.4 MG SL tablet   Sublingual   Place 0.4 mg under the tongue every 5  (five) minutes as needed for chest pain.         . Red Yeast Rice 600 MG CAPS   Oral   Take 1 capsule by mouth 2 (two) times daily.            BP 114/77  Pulse 46  Temp(Src) 98 F (36.7 C) (Oral)  Resp 16  SpO2 97% Physical Exam  Nursing note and vitals reviewed. Constitutional: He is oriented to person, place, and time. He appears well-developed and well-nourished. No distress.  HENT:  Head: Normocephalic and atraumatic.  Mouth/Throat: Oropharynx is clear and moist.  Eyes: Conjunctivae and EOM are normal. Pupils are equal, round, and reactive to light.  Neck: Normal range of motion. Neck supple. Spinous process tenderness and muscular tenderness present.  Cardiovascular: Regular rhythm and intact distal pulses.  Bradycardia present.   No murmur heard. Pulmonary/Chest: Effort normal and breath sounds normal. No respiratory distress. He has no wheezes. He has no rales. He exhibits no tenderness.  Abdominal: Soft. He exhibits no distension. There is no tenderness. There is no rebound and no guarding.  Musculoskeletal: Normal range of motion. He exhibits tenderness. He exhibits no edema.  Neurological: He is alert and oriented to person, place, and time. He has normal strength. No cranial nerve deficit or sensory deficit.  Difficulty with memory  Skin: Skin is warm and dry. No rash noted. No erythema.  Psychiatric: He has a normal mood and affect. His behavior is normal.    ED Course  Procedures (including critical care time) Labs Review Labs Reviewed  PRO B NATRIURETIC PEPTIDE  BASIC METABOLIC PANEL  CBC  URINALYSIS, ROUTINE W REFLEX MICROSCOPIC  POCT I-STAT TROPONIN I   Imaging Review Dg Chest 2 View  04/26/2013   CLINICAL DATA:  Chest pain.  EXAM: CHEST  2 VIEW  COMPARISON:  12/22/2012 and 01/27/2009  FINDINGS: The lungs are adequately inflated without consolidation or effusion. There is mild stable cardiomegaly. There is calcified plaque over the thoracic aorta and  mild spondylosis of the spine. Remainder of the exam is unchanged.  IMPRESSION: No active cardiopulmonary disease.   Electronically Signed   By: Marin Olp M.D.   On: 04/26/2013 13:46   Ct Head Wo Contrast  04/26/2013   CLINICAL DATA:  Confusion.  EXAM: CT HEAD WITHOUT CONTRAST  TECHNIQUE: Contiguous axial images were obtained from the base of the skull through the vertex without intravenous contrast.  COMPARISON:  04/26/2013.  FINDINGS: The ventricles are normal for age. Mild stable atrophy. No extra-axial fluid collections. No CT findings for acute hemispheric infarction an or intracranial hemorrhage. No mass lesions. Small bilateral dilated perivascular spaces appear The  brainstem and cerebellum are grossly normal.  The bony structures are unremarkable. The paranasal sinuses and mastoid air cells are clear. The globes are intact.  IMPRESSION: No acute intracranial findings or mass lesion.   Electronically Signed   By: Kalman Jewels M.D.   On: 04/26/2013 14:09   Ct Cervical Spine Wo Contrast  04/26/2013   CLINICAL DATA:  Neck pain.  EXAM: CT CERVICAL SPINE WITHOUT CONTRAST  TECHNIQUE: Multidetector CT imaging of the cervical spine was performed without intravenous contrast. Multiplanar CT image reconstructions were also generated.  COMPARISON:  None.  FINDINGS: Normal alignment of the cervical vertebral bodies. Disc spaces and vertebral bodies are maintained. No acute fracture or abnormal prevertebral soft tissue swelling. The facets are normally aligned. The skullbase C1 and C1-2 articulations are maintained. The dens is normal. No large disc protrusions, spinal or foraminal stenosis. The lung apices are clear.  IMPRESSION: Degenerative cervical spondylosis with disc disease and facet disease but normal alignment and no acute bony findings.   Electronically Signed   By: Kalman Jewels M.D.   On: 04/26/2013 14:12    EKG Interpretation    Date/Time:  Sunday April 26 2013 11:43:54 EST Ventricular  Rate:  47 PR Interval:  236 QRS Duration: 126 QT Interval:  450 QTC Calculation: 398 R Axis:   22 Text Interpretation:  Sinus bradycardia with 1st degree A-V block Right bundle branch block No significant change since last tracing Confirmed by Mabelle Mungin  MD, Gini Caputo ([id number]) on 04/26/2013 12:29:44 PM            MDM   1. Cervical disc disease   2. Headache   3. Hallucinations   4. Stable angina     Patient 2 she is going on today. Family brought him mostly because he's had headaches for the last 6 months it seemed to be getting worse. The originating in his neck and then traveled to the back of his head. Wife gives him ibuprofen and it improves most of the time. Currently patient is not complaining of any headache. Also states over the last 3 weeks he's had worsening hallucinations and confusion only at night. She states he's not sleeping at night and seems to and sees people who aren't there and ask either in a strange house. But during the day he appears to be fine. Denies any recent medication changes and denies any infectious symptoms. Patient currently is not complaining of a headache and does not remember anything that the wife is talking about.  Based on history and patient's symptoms sound like he is having sundowners from dementia. However he's never been formally diagnosed. Also headaches sound like they are stemming from cervical issues. Patient is neurovascularly intact on exam with no focal neurologic findings. CT of the head and neck pending. Urinalysis, CBC, CMP also pending for further evaluation for cause of hallucinations.  2:15 PM Head CT neg for acute findings.  C-spine with diffuse degenerative changes and disc disease which is most likely causing the headaches.  Will ppx valium for muscle relaxation.  Also recommended pt f/u with PCP for possible meds for dementia and hallucinations.  Secondly patient has a history of MI and coronary artery disease with stents placement  in 2006 and 2010.  Wife states yesterday patient had an episode that lasted for 5 minutes of chest pain not relieved after nitroglycerin and then he had another episode today that was brief and resolved without medication. She states yesterday he became diaphoretic and appeared short  of breath while he was having the pain and has been fine since. She states he's not get chest pain often. EKG is unchanged. Initial troponin is negative.  Pt's initial troponin neg and second trop pending.  Pt seen by his cardiologist in [place] who at that time was concerned for progressive CAD however pt refused to do the nuclear scan.  If second troponin pending will d/c home to f/u with Dr. Burt Knack on Monday or return for worsening recurrent pain that does not resolve.  4:59 PM Second trop neg.  Blanchie Dessert, MD 04/26/13 [place], MD 04/26/13 1701

## 2013-05-24 ENCOUNTER — Emergency Department (HOSPITAL_COMMUNITY)
Admission: EM | Admit: 2013-05-24 | Discharge: 2013-05-25 | Disposition: A | Discharge status: Home / Self Care | Payer: Medicare Other | Attending: Emergency Medicine | Admitting: Emergency Medicine

## 2013-05-24 DIAGNOSIS — I2510 Atherosclerotic heart disease of native coronary artery without angina pectoris: Secondary | ICD-10-CM | POA: Insufficient documentation

## 2013-05-24 DIAGNOSIS — Z87891 Personal history of nicotine dependence: Secondary | ICD-10-CM | POA: Insufficient documentation

## 2013-05-24 DIAGNOSIS — G309 Alzheimer's disease, unspecified: Secondary | ICD-10-CM | POA: Insufficient documentation

## 2013-05-24 DIAGNOSIS — Z7982 Long term (current) use of aspirin: Secondary | ICD-10-CM | POA: Insufficient documentation

## 2013-05-24 DIAGNOSIS — H3530 Unspecified macular degeneration: Secondary | ICD-10-CM | POA: Insufficient documentation

## 2013-05-24 DIAGNOSIS — R079 Chest pain, unspecified: Secondary | ICD-10-CM | POA: Insufficient documentation

## 2013-05-24 DIAGNOSIS — F0280 Dementia in other diseases classified elsewhere without behavioral disturbance: Secondary | ICD-10-CM | POA: Insufficient documentation

## 2013-05-24 DIAGNOSIS — IMO0002 Reserved for concepts with insufficient information to code with codable children: Secondary | ICD-10-CM | POA: Insufficient documentation

## 2013-05-24 DIAGNOSIS — E785 Hyperlipidemia, unspecified: Secondary | ICD-10-CM | POA: Insufficient documentation

## 2013-05-24 DIAGNOSIS — Z79899 Other long term (current) drug therapy: Secondary | ICD-10-CM | POA: Insufficient documentation

## 2013-05-24 DIAGNOSIS — I252 Old myocardial infarction: Secondary | ICD-10-CM | POA: Insufficient documentation

## 2013-05-24 HISTORY — DX: Alzheimer's disease, unspecified: G30.9

## 2013-05-24 HISTORY — DX: Dementia in other diseases classified elsewhere, unspecified severity, without behavioral disturbance, psychotic disturbance, mood disturbance, and anxiety: F02.80

## 2013-05-25 ENCOUNTER — Encounter (HOSPITAL_COMMUNITY): Payer: Self-pay | Admitting: Emergency Medicine

## 2013-05-25 LAB — BASIC METABOLIC PANEL
BUN: 32 mg/dL — ABNORMAL HIGH (ref 6–23)
CALCIUM: 9.2 mg/dL (ref 8.4–10.5)
CO2: 26 meq/L (ref 19–32)
Chloride: 104 mEq/L (ref 96–112)
Creatinine, Ser: 1.61 mg/dL — ABNORMAL HIGH (ref 0.50–1.35)
GFR calc Af Amer: 44 mL/min — ABNORMAL LOW (ref >90)
GFR calc non Af Amer: 38 mL/min — ABNORMAL LOW (ref >90)
GLUCOSE: 112 mg/dL — AB (ref 70–99)
POTASSIUM: 4.8 meq/L (ref 3.7–5.3)
Sodium: 140 mEq/L (ref 137–147)

## 2013-05-25 LAB — URINALYSIS, ROUTINE W REFLEX MICROSCOPIC
Bilirubin Urine: NEGATIVE
GLUCOSE, UA: NEGATIVE mg/dL
HGB URINE DIPSTICK: NEGATIVE
Ketones, ur: NEGATIVE mg/dL
Leukocytes, UA: NEGATIVE
Nitrite: NEGATIVE
Protein, ur: NEGATIVE mg/dL
SPECIFIC GRAVITY, URINE: 1.02 (ref 1.005–1.030)
Urobilinogen, UA: 0.2 mg/dL (ref 0.0–1.0)
pH: 6 (ref 5.0–8.0)

## 2013-05-25 LAB — CBC WITH DIFFERENTIAL/PLATELET
Basophils Absolute: 0 10*3/uL (ref 0.0–0.1)
Basophils Relative: 0 % (ref 0–1)
EOS PCT: 5 % (ref 0–5)
Eosinophils Absolute: 0.4 10*3/uL (ref 0.0–0.7)
HCT: 40.9 % (ref 39.0–52.0)
HEMOGLOBIN: 13.4 g/dL (ref 13.0–17.0)
LYMPHS ABS: 1.6 10*3/uL (ref 0.7–4.0)
LYMPHS PCT: 18 % (ref 12–46)
MCH: 30.7 pg (ref 26.0–34.0)
MCHC: 32.8 g/dL (ref 30.0–36.0)
MCV: 93.8 fL (ref 78.0–100.0)
Monocytes Absolute: 0.7 10*3/uL (ref 0.1–1.0)
Monocytes Relative: 7 % (ref 3–12)
Neutro Abs: 6.5 10*3/uL (ref 1.7–7.7)
Neutrophils Relative %: 70 % (ref 43–77)
PLATELETS: 202 10*3/uL (ref 150–400)
RBC: 4.36 MIL/uL (ref 4.22–5.81)
RDW: 13 % (ref 11.5–15.5)
WBC: 9.2 10*3/uL (ref 4.0–10.5)

## 2013-05-25 LAB — POCT I-STAT TROPONIN I
TROPONIN I, POC: 0.01 ng/mL (ref 0.00–0.08)
Troponin i, poc: 0 ng/mL (ref 0.00–0.08)

## 2013-05-25 MED ORDER — ACETAMINOPHEN 500 MG PO TABS
1000.0000 mg | ORAL_TABLET | Freq: Once | ORAL | Status: AC
  Administered 2013-05-25: 1000 mg via ORAL
  Filled 2013-05-25: qty 2

## 2013-05-25 MED ORDER — FENTANYL CITRATE 0.05 MG/ML IJ SOLN
50.0000 ug | Freq: Once | INTRAMUSCULAR | Status: AC
  Administered 2013-05-25: 50 ug via INTRAVENOUS
  Filled 2013-05-25: qty 2

## 2013-05-25 NOTE — ED Notes (Signed)
Pt c/o central chest pain, denies N/V, dizziness. Per family, pt recently diagnosed with Alzheimer's and had a "sundowner's episode" this evening.

## 2013-05-25 NOTE — ED Notes (Signed)
Pt's family requesting an MRI to expedite the diagnosis process of Alzheimer's with the [facility]. Informed EDP of request.

## 2013-05-25 NOTE — Discharge Instructions (Signed)
Chest Pain (Nonspecific) °It is often hard to give a specific diagnosis for the cause of chest pain. There is always a chance that your pain could be related to something serious, such as a heart attack or a blood clot in the lungs. You need to follow up with your caregiver for further evaluation. °CAUSES  °· Heartburn. °· Pneumonia or bronchitis. °· Anxiety or stress. °· Inflammation around your heart (pericarditis) or lung (pleuritis or pleurisy). °· A blood clot in the lung. °· A collapsed lung (pneumothorax). It can develop suddenly on its own (spontaneous pneumothorax) or from injury (trauma) to the chest. °· Shingles infection (herpes zoster virus). °The chest wall is composed of bones, muscles, and cartilage. Any of these can be the source of the pain. °· The bones can be bruised by injury. °· The muscles or cartilage can be strained by coughing or overwork. °· The cartilage can be affected by inflammation and become sore (costochondritis). °DIAGNOSIS  °Lab tests or other studies, such as X-rays, electrocardiography, stress testing, or cardiac imaging, may be needed to find the cause of your pain.  °TREATMENT  °· Treatment depends on what may be causing your chest pain. Treatment may include: °· Acid blockers for heartburn. °· Anti-inflammatory medicine. °· Pain medicine for inflammatory conditions. °· Antibiotics if an infection is present. °· You may be advised to change lifestyle habits. This includes stopping smoking and avoiding alcohol, caffeine, and chocolate. °· You may be advised to keep your head raised (elevated) when sleeping. This reduces the chance of acid going backward from your stomach into your esophagus. °· Most of the time, nonspecific chest pain will improve within 2 to 3 days with rest and mild pain medicine. °HOME CARE INSTRUCTIONS  °· If antibiotics were prescribed, take your antibiotics as directed. Finish them even if you start to feel better. °· For the next few days, avoid physical  activities that bring on chest pain. Continue physical activities as directed. °· Do not smoke. °· Avoid drinking alcohol. °· Only take over-the-counter or prescription medicine for pain, discomfort, or fever as directed by your caregiver. °· Follow your caregiver's suggestions for further testing if your chest pain does not go away. °· Keep any follow-up appointments you made. If you do not go to an appointment, you could develop lasting (chronic) problems with pain. If there is any problem keeping an appointment, you must call to reschedule. °SEEK MEDICAL CARE IF:  °· You think you are having problems from the medicine you are taking. Read your medicine instructions carefully. °· Your chest pain does not go away, even after treatment. °· You develop a rash with blisters on your chest. °SEEK IMMEDIATE MEDICAL CARE IF:  °· You have increased chest pain or pain that spreads to your arm, neck, jaw, back, or abdomen. °· You develop shortness of breath, an increasing cough, or you are coughing up blood. °· You have severe back or abdominal pain, feel nauseous, or vomit. °· You develop severe weakness, fainting, or chills. °· You have a fever. °THIS IS AN EMERGENCY. Do not wait to see if the pain will go away. Get medical help at once. Call your local emergency services (911 in [place] Do not drive yourself to the hospital. °MAKE SURE YOU:  °· Understand these instructions. °· Will watch your condition. °· Will get help right away if you are not doing well or get worse. °Document Released: 01/17/2005 Document Revised: 07/02/2011 Document Reviewed: 11/13/2007 °[employer]® Patient Information ©2014 [employer]   [employer]. ° °

## 2013-05-25 NOTE — ED Notes (Signed)
Pt reports taking 3 Ibuprofen and 1 Nitro; states "I don't know if it helped". Spoke with pt's family, they report that pt has been recently diagnosed with Alzheimer's and has had some changes in medication recently. Pt became agitated, pacing back and forth and "seeing things that aren't there". Pt then began complaining of chest pain. Pt's wife gave pt 3 Ibuprofen and 1 Nitro. Pt reports, "I don't know if it helped".

## 2013-05-25 NOTE — ED Provider Notes (Signed)
CSN: [id number]     Arrival date & time 05/24/13  2352 History  This chart was scribed for Wynetta Fines, MD by Maree Erie, ED Scribe. The patient was seen in room APA10/APA10. Patient's care was started at 12:18 AM.    Chief Complaint  Patient presents with  . Chest Pain    The history is provided by the patient. No language interpreter was used.   Level 5 caveat: dementia  HPI Comments: David Collier is a 80 y.o. male with a history of Alzheimer's and five MI placed who presents to the Emergency Department due to an episode of "sundowning" at home earlier, by which the family reports means confusion and agitation. During this episode he also mentioned having chest pain. He characterized the pain as "pressure in his ticker." Per family, he was mildly short of breath and dizzy during the episode. He took three ibuprofen and and one nitroglycerin but is unable to tell if that helped alleviate the pain. He is denying pain now. The family state that he was just started on a new medication, Aricept (donepezil), a few days ago. However, he had another episode of chest pain about a week ago before he was started on the medication.   Per note from family, "Had Donepezil at 8 PM and thirty minutes later he just went wild- going back and forth- looking for " his stuff" and all the things that are not here- happens every evening. Diagnosed with Dementia/Alzheimer's at [facility] on April 4 - went to [facility]/15/13."   Past Medical History  Diagnosis Date  . Hyperlipidemia   . Myocardial infarction   . Coronary atherosclerosis of native coronary artery     BMS circumflex and OM 2006, PTCA ISR 2010, moderate LAD disease  . DJD (degenerative joint disease)   . Macular degeneration   . Alzheimer's dementia    Past Surgical History  Procedure Laterality Date  . Appendectomy     Family History  Problem Relation Age of Onset  . Heart failure     History  Substance  Use Topics  . Smoking status: Former Smoker    Types: Cigarettes    Quit date: 04/23/1966  . Smokeless tobacco: Not on file  . Alcohol Use: No    Review of Systems  Unable to perform ROS: Dementia      Allergies  Atorvastatin and Simvastatin  Home Medications   Current Outpatient Rx  Name  Route  Sig  Dispense  Refill  . donepezil (ARICEPT) 10 MG tablet   Oral   Take 10 mg by mouth at bedtime.         . Ascorbic Acid (VITAMIN C) 500 MG tablet   Oral   Take 500 mg by mouth daily.           Marland Kitchen aspirin EC 81 MG tablet   Oral   Take 81 mg by mouth daily.         . Coenzyme Q10 (COQ10) 100 MG CAPS   Oral   Take 1 capsule by mouth daily.          . diazepam (VALIUM) 5 MG tablet   Oral   Take 1 tablet (5 mg total) by mouth every 12 (twelve) hours as needed for muscle spasms (headache).   20 tablet   0   . ibuprofen (ADVIL,MOTRIN) 200 MG tablet   Oral   Take 200-400 mg by mouth every 6 (six) hours as  needed.         . metoprolol (LOPRESSOR) 50 MG tablet   Oral   Take 25 mg by mouth 2 (two) times daily.         . nitroGLYCERIN (NITROSTAT) 0.4 MG SL tablet   Sublingual   Place 0.4 mg under the tongue every 5 (five) minutes as needed for chest pain.         . Red Yeast Rice 600 MG CAPS   Oral   Take 1 capsule by mouth 2 (two) times daily.            Triage Vitals: BP 149/56  Pulse 66  Temp(Src) 98.2 F (36.8 C) (Oral)  Resp 22  SpO2 96%  Physical Exam  Nursing note and vitals reviewed. General: Well-developed, well-nourished male in no acute distress; appearance consistent with age of record HENT: normocephalic; atraumatic Eyes: extraocular muscles intact; left pupil reactive to light; right pupil non reactive   Neck: supple Heart: regular rate and rhythm; distant sounds Lungs: clear to auscultation bilaterally Abdomen: soft; nondistended; nontender; no masses or hepatosplenomegaly; bowel sounds present Extremities: No deformity; full  range of motion; pulses normal Neurologic: Awake, alert and oriented to person and place but not time or date; motor function intact in all extremities and symmetric; no facial droop Skin: Warm and dry Psychiatric: Normal mood and affect    ED Course  Procedures (including critical care time)  DIAGNOSTIC STUDIES: Oxygen Saturation is 96% on room air, adequate by my interpretation.    COORDINATION OF CARE: 12:21 AM -Will order CBC, BMP, Urinalysis and i-Stat Troponin now and every three hours. Patient's family verbalizes understanding and agrees with treatment plan.   MDM   Nursing notes and vitals signs, including pulse oximetry, reviewed.  Summary of this visit's results, reviewed by myself:  Labs:  Results for orders placed during the hospital encounter of 05/24/13 (from the past 24 hour(s))  CBC WITH DIFFERENTIAL     Status: None   Collection Time    05/25/13  1:04 AM      Result Value Range   WBC 9.2  4.0 - 10.5 K/uL   RBC 4.36  4.22 - 5.81 MIL/uL   Hemoglobin 13.4  13.0 - 17.0 g/dL   HCT 40.9  39.0 - 52.0 %   MCV 93.8  78.0 - 100.0 fL   MCH 30.7  26.0 - 34.0 pg   MCHC 32.8  30.0 - 36.0 g/dL   RDW 13.0  11.5 - 15.5 %   Platelets 202  150 - 400 K/uL   Neutrophils Relative % 70  43 - 77 %   Neutro Abs 6.5  1.7 - 7.7 K/uL   Lymphocytes Relative 18  12 - 46 %   Lymphs Abs 1.6  0.7 - 4.0 K/uL   Monocytes Relative 7  3 - 12 %   Monocytes Absolute 0.7  0.1 - 1.0 K/uL   Eosinophils Relative 5  0 - 5 %   Eosinophils Absolute 0.4  0.0 - 0.7 K/uL   Basophils Relative 0  0 - 1 %   Basophils Absolute 0.0  0.0 - 0.1 K/uL  BASIC METABOLIC PANEL     Status: Abnormal   Collection Time    05/25/13  1:04 AM      Result Value Range   Sodium 140  137 - 147 mEq/L   Potassium 4.8  3.7 - 5.3 mEq/L   Chloride 104  96 - 112 mEq/L   CO2  26  19 - 32 mEq/L   Glucose, Bld 112 (*) 70 - 99 mg/dL   BUN 32 (*) 6 - 23 mg/dL   Creatinine, Ser 1.61 (*) 0.50 - 1.35 mg/dL   Calcium 9.2  8.4 -  10.5 mg/dL   GFR calc non Af Amer 38 (*) >90 mL/min   GFR calc Af Amer 44 (*) >90 mL/min  POCT I-STAT TROPONIN I     Status: None   Collection Time    05/25/13  1:20 AM      Result Value Range   Troponin i, poc 0.00  0.00 - 0.08 ng/mL   Comment 3           URINALYSIS, ROUTINE W REFLEX MICROSCOPIC     Status: None   Collection Time    05/25/13  1:37 AM      Result Value Range   Color, Urine YELLOW  YELLOW   APPearance CLEAR  CLEAR   Specific Gravity, Urine 1.020  1.005 - 1.030   pH 6.0  5.0 - 8.0   Glucose, UA NEGATIVE  NEGATIVE mg/dL   Hgb urine dipstick NEGATIVE  NEGATIVE   Bilirubin Urine NEGATIVE  NEGATIVE   Ketones, ur NEGATIVE  NEGATIVE mg/dL   Protein, ur NEGATIVE  NEGATIVE mg/dL   Urobilinogen, UA 0.2  0.0 - 1.0 mg/dL   Nitrite NEGATIVE  NEGATIVE   Leukocytes, UA NEGATIVE  NEGATIVE  POCT I-STAT TROPONIN I     Status: None   Collection Time    05/25/13  4:20 AM      Result Value Range   Troponin i, poc 0.01  0.00 - 0.08 ng/mL   Comment 3            4:34 AM Patient has been chest pain-free throughout his entire ED course. He was complaining of neck pain and was given a small dose of fentanyl for this. Based on the timing of the patient's reported chest pain, about 8 or 9 PM. The 4:20 AM troponin is well outside the six-hour window. He has ruled out for an acute myocardial infarction. He will follow up with his cardiologist as an outpatient.   I personally performed the services described in this documentation, which was scribed in my presence.  The recorded information has been reviewed and is accurate.    Wynetta Fines, MD 05/25/13 [phone]

## 2013-08-19 ENCOUNTER — Emergency Department (HOSPITAL_COMMUNITY): Payer: Medicare Other

## 2013-08-19 ENCOUNTER — Encounter (HOSPITAL_COMMUNITY): Payer: Self-pay | Admitting: Emergency Medicine

## 2013-08-19 ENCOUNTER — Emergency Department (HOSPITAL_COMMUNITY)
Admission: EM | Admit: 2013-08-19 | Discharge: 2013-08-19 | Disposition: A | Discharge status: Home / Self Care | Payer: Medicare Other | Attending: Emergency Medicine | Admitting: Emergency Medicine

## 2013-08-19 DIAGNOSIS — G309 Alzheimer's disease, unspecified: Secondary | ICD-10-CM | POA: Insufficient documentation

## 2013-08-19 DIAGNOSIS — Z87891 Personal history of nicotine dependence: Secondary | ICD-10-CM | POA: Insufficient documentation

## 2013-08-19 DIAGNOSIS — F0280 Dementia in other diseases classified elsewhere without behavioral disturbance: Secondary | ICD-10-CM | POA: Insufficient documentation

## 2013-08-19 DIAGNOSIS — Z7982 Long term (current) use of aspirin: Secondary | ICD-10-CM | POA: Insufficient documentation

## 2013-08-19 DIAGNOSIS — M1990 Unspecified osteoarthritis, unspecified site: Secondary | ICD-10-CM | POA: Insufficient documentation

## 2013-08-19 DIAGNOSIS — Z9089 Acquired absence of other organs: Secondary | ICD-10-CM | POA: Insufficient documentation

## 2013-08-19 DIAGNOSIS — I252 Old myocardial infarction: Secondary | ICD-10-CM | POA: Insufficient documentation

## 2013-08-19 DIAGNOSIS — Y92009 Unspecified place in unspecified non-institutional (private) residence as the place of occurrence of the external cause: Secondary | ICD-10-CM | POA: Insufficient documentation

## 2013-08-19 DIAGNOSIS — Z79899 Other long term (current) drug therapy: Secondary | ICD-10-CM | POA: Insufficient documentation

## 2013-08-19 DIAGNOSIS — S2249XA Multiple fractures of ribs, unspecified side, initial encounter for closed fracture: Secondary | ICD-10-CM | POA: Insufficient documentation

## 2013-08-19 DIAGNOSIS — I2510 Atherosclerotic heart disease of native coronary artery without angina pectoris: Secondary | ICD-10-CM | POA: Insufficient documentation

## 2013-08-19 DIAGNOSIS — W06XXXA Fall from bed, initial encounter: Secondary | ICD-10-CM | POA: Insufficient documentation

## 2013-08-19 DIAGNOSIS — Y9389 Activity, other specified: Secondary | ICD-10-CM | POA: Insufficient documentation

## 2013-08-19 DIAGNOSIS — W19XXXA Unspecified fall, initial encounter: Secondary | ICD-10-CM

## 2013-08-19 DIAGNOSIS — R1084 Generalized abdominal pain: Secondary | ICD-10-CM | POA: Insufficient documentation

## 2013-08-19 DIAGNOSIS — Z8669 Personal history of other diseases of the nervous system and sense organs: Secondary | ICD-10-CM | POA: Insufficient documentation

## 2013-08-19 MED ORDER — HYDROCODONE-ACETAMINOPHEN 5-325 MG PO TABS: 2.0000 | ORAL_TABLET | ORAL | Status: DC | PRN

## 2013-08-19 MED ORDER — MORPHINE SULFATE 4 MG/ML IJ SOLN
4.0000 mg | Freq: Once | INTRAMUSCULAR | Status: AC
  Administered 2013-08-19: 4 mg via INTRAVENOUS
  Filled 2013-08-19: qty 1

## 2013-08-19 MED ORDER — HYDROMORPHONE HCL PF 1 MG/ML IJ SOLN
INTRAMUSCULAR | Status: AC
  Filled 2013-08-19: qty 1

## 2013-08-19 MED ORDER — HYDROMORPHONE HCL PF 1 MG/ML IJ SOLN
1.0000 mg | Freq: Once | INTRAMUSCULAR | Status: AC
  Administered 2013-08-19: 1 mg via INTRAMUSCULAR

## 2013-08-19 NOTE — ED Provider Notes (Signed)
CSN: [id number]     Arrival date & time 08/19/13  2031 History   First MD Initiated Contact with Patient 08/19/13 2038     Chief Complaint  Patient presents with  . Fall     (Consider location/radiation/quality/duration/timing/severity/associated sxs/prior Treatment) HPI Patient with dementia presents after a mechanical fall. History of present illness is per the patient's wife, as the patient has dementia, and very poor hearing. Patient was standing on a bed, fell onto a carpeted floor. Since that time he said pain in the left rib cage. Patient has been ambulatory, and there is no loss of consciousness. Patient's pain is severe, and he has not received medication for the pain.  Pain is focally in the left lower rib cage.  Past Medical History  Diagnosis Date  . Hyperlipidemia   . Myocardial infarction   . Coronary atherosclerosis of native coronary artery     BMS circumflex and OM 2006, PTCA ISR 2010, moderate LAD disease  . DJD (degenerative joint disease)   . Macular degeneration   . Alzheimer's dementia    Past Surgical History  Procedure Laterality Date  . Appendectomy     Family History  Problem Relation Age of Onset  . Heart failure     History  Substance Use Topics  . Smoking status: Former Smoker    Types: Cigarettes    Quit date: 04/23/1966  . Smokeless tobacco: Not on file  . Alcohol Use: No    Review of Systems  Unable to perform ROS: Dementia      Allergies  Aricept; Atorvastatin; Simvastatin; and Sorbitan  Home Medications   Prior to Admission medications   Medication Sig Start Date End Date Taking? Authorizing Provider  Ascorbic Acid (VITAMIN C) 500 MG tablet Take 500 mg by mouth daily.     Yes Historical Provider, MD  aspirin EC 81 MG tablet Take 81 mg by mouth daily.   Yes Historical Provider, MD  cholecalciferol (D-VI-SOL) 400 UNIT/ML LIQD Take 400 Units by mouth daily.   Yes Historical Provider, MD  Coenzyme Q10 (COQ10) 100 MG CAPS Take  1 capsule by mouth daily.    Yes Historical Provider, MD  Melatonin 5 MG TABS Take 5-10 mg by mouth at bedtime.   Yes Historical Provider, MD  metoprolol (LOPRESSOR) 50 MG tablet Take 25 mg by mouth 2 (two) times daily.   Yes Historical Provider, MD  nitroGLYCERIN (NITROSTAT) 0.4 MG SL tablet Place 0.4 mg under the tongue every 5 (five) minutes as needed for chest pain.   Yes Historical Provider, MD  Red Yeast Rice 600 MG CAPS Take 1 capsule by mouth 2 (two) times daily.     Yes Historical Provider, MD  VITAMIN E BLEND PO Take 2,000 mg by mouth daily.   Yes Historical Provider, MD   BP 127/92  Pulse 84  Temp(Src) 97.3 F (36.3 C)  Resp 18  Ht 5\' 9"  (1.753 m)  Wt 220 lb (99.791 kg)  BMI 32.47 kg/m2  SpO2 100% Physical Exam  Nursing note and vitals reviewed. Constitutional: He is oriented to person, place, and time. He appears well-developed. No distress.  HENT:  Head: Normocephalic and atraumatic.  Eyes: Conjunctivae and EOM are normal.  Neck:  No gross deformities, but the patient will not laterally rotate his neck secondary to pain  Cardiovascular: Normal rate and regular rhythm.   Pulmonary/Chest: Effort normal and breath sounds normal. No stridor. No respiratory distress. He has no decreased breath sounds. He has no  wheezes.    Abdominal: He exhibits no distension. There is generalized tenderness. There is guarding. There is no rigidity and no rebound.  Musculoskeletal: He exhibits no edema.  Neurological: He is alert and oriented to person, place, and time.  Patient was ultra be spontaneously, has no facial asymmetry.  Speech is brief but clear. Patient is extremely poor hearing.   Skin: Skin is warm and dry.  Psychiatric: His speech is delayed. Cognition and memory are impaired.    ED Course  Procedures (including critical care time) Labs Review Labs Reviewed - No data to display  Imaging Review Ct Abdomen Pelvis Wo Contrast  08/19/2013   CLINICAL DATA:  Status post  fall now with left rib pain  EXAM: CT CHEST, ABDOMEN AND PELVIS WITHOUT CONTRAST  TECHNIQUE: Multidetector CT imaging of the chest, abdomen and pelvis was performed following the standard protocol without IV contrast.  COMPARISON:  DG ABD 2 VIEWS dated 05/06/2004; CT ABDOMEN W/O CM dated 05/06/2004  FINDINGS: CT CHEST FINDINGS  At lung window settings there is no evidence of a pulmonary contusion or pneumothorax or pneumomediastinum. There is minimal compressive atelectasis posteriorly at both lung bases.  At mediastinal window settings the cardiac chambers are normal in size. There are coronary artery calcifications. There is no evidence of a mediastinal hematoma. There is no pleural nor pericardial effusion. There is no mediastinal nor hilar lymphadenopathy. The caliber of the thoracic esophagus is normal.  At bone window settings the there are fractures of the lateral aspects of the seventh, eighth, and ninth ribs. No right rib fracture is demonstrated. The thoracic vertebral bodies are preserved in height. The sternum appears intact.  CT ABDOMEN AND PELVIS FINDINGS  Within the abdomen the liver exhibits no parenchymal laceration or subcapsular hemorrhage. There may be a sub cm cyst in the dome of the liver. The gallbladder is surgically absent. The stomach is moderately distended with food and gas but appears normal. The duodenum, pancreas, and spleen are normal in appearance. The adrenal glands and kidneys exhibit no acute abnormalities. On the left and there is an exophytic midpole hypodensity measuring 4.6 cm in greatest dimension with Hounsfield measurement of -4 most compatible with a cyst. On the right there is a lower pole hypodensity measuring 3 cm in diameter which exhibits Hounsfield measurement of +19. These findings are not new The caliber of the abdominal aorta exhibits mild failure to taper but there is no evidence of an aneurysm. The partially distended urinary bladder is normal in appearance. The  prostate gland is enlarged and produces an impression upon the urinary bladder base. There is no free fluid in the abdomen or pelvis. There is no retroperitoneal hemorrhage. There is no inguinal nor umbilical hernia. The non-opacified loops of small and large bowel exhibit a normal stool and gas pattern.  The lumbar vertebral bodies are preserved in height. There is degenerative facet joint change at multiple levels. The bony pelvis appears intact. The observed portions of the proximal femurs appear normal.  IMPRESSION: 1. There is no evidence of a pulmonary contusion or pneumothorax or pneumomediastinum in this patient who has sustained fractures of the lateral aspects of the left seventh, eighth, and ninth ribs. 2. There is no evidence of a mediastinal hematoma nor of a pericardial effusion. 3. There are coronary artery calcifications present. 4. There is no evidence of acute visceral injury within the abdomen or pelvis. 5. There is no acute hepatobiliary nor acute urinary tract abnormalities. There are likely cysts  within both kidneys. There is mild right renal atrophy. The small and large bowel exhibit no acute abnormalities.   Electronically Signed   By: David  Martinique   On: 08/19/2013 22:10   Ct Head Wo Contrast  08/19/2013   CLINICAL DATA:  Fall  EXAM: CT HEAD WITHOUT CONTRAST  CT CERVICAL SPINE WITHOUT CONTRAST  TECHNIQUE: Multidetector CT imaging of the head and cervical spine was performed following the standard protocol without intravenous contrast. Multiplanar CT image reconstructions of the cervical spine were also generated.  COMPARISON:  Prior CT from 04/26/2013.  FINDINGS: CT HEAD FINDINGS  The age-related atrophy is unchanged. Scattered and confluent hypodensity within the periventricular and deep white matter of the stroke hemispheres is most consistent with chronic small vessel ischemic changes. Remote lacunar infarct noted within the right basal ganglia.  There is no acute intracranial  hemorrhage or infarct. No mass lesion or midline shift. Gray-white matter differentiation is well maintained. Ventricles are normal in size without evidence of hydrocephalus. CSF containing spaces are within normal limits. No extra-axial fluid collection.  The calvarium is intact.  Orbital soft tissues are within normal limits.  The paranasal sinuses and mastoid air cells are well pneumatized and free of fluid.  Scalp soft tissues are unremarkable.  CT CERVICAL SPINE FINDINGS  The vertebral bodies are normally aligned with preservation of the normal cervical lordosis. Vertebral body heights are preserved. Normal C1-2 articulations are intact. No prevertebral soft tissue swelling. No acute fracture or listhesis.  Moderate degenerative disc disease as evidenced by intervertebral disc space narrowing and endplate osteophytosis present at C5-6.  Visualized soft tissues of the neck are within normal limits. Visualized lung apices are clear without evidence of apical pneumothorax.  IMPRESSION: CT BRAIN:  1. No acute intracranial process. 2. Age-related atrophy with moderate chronic microvascular ischemic disease, unchanged. 3. Remote lacunar infarct within the right basal ganglia, unchanged.  CT CERVICAL SPINE:  1. No acute traumatic injury within the cervical spine. 2. Moderate degenerative disc disease at C5-6 and C6-7.   Electronically Signed   By: Jeannine Boga M.D.   On: 08/19/2013 21:57   Ct Chest Wo Contrast  08/19/2013   CLINICAL DATA:  Status post fall now with left rib pain  EXAM: CT CHEST, ABDOMEN AND PELVIS WITHOUT CONTRAST  TECHNIQUE: Multidetector CT imaging of the chest, abdomen and pelvis was performed following the standard protocol without IV contrast.  COMPARISON:  DG ABD 2 VIEWS dated 05/06/2004; CT ABDOMEN W/O CM dated 05/06/2004  FINDINGS: CT CHEST FINDINGS  At lung window settings there is no evidence of a pulmonary contusion or pneumothorax or pneumomediastinum. There is minimal compressive  atelectasis posteriorly at both lung bases.  At mediastinal window settings the cardiac chambers are normal in size. There are coronary artery calcifications. There is no evidence of a mediastinal hematoma. There is no pleural nor pericardial effusion. There is no mediastinal nor hilar lymphadenopathy. The caliber of the thoracic esophagus is normal.  At bone window settings the there are fractures of the lateral aspects of the seventh, eighth, and ninth ribs. No right rib fracture is demonstrated. The thoracic vertebral bodies are preserved in height. The sternum appears intact.  CT ABDOMEN AND PELVIS FINDINGS  Within the abdomen the liver exhibits no parenchymal laceration or subcapsular hemorrhage. There may be a sub cm cyst in the dome of the liver. The gallbladder is surgically absent. The stomach is moderately distended with food and gas but appears normal. The duodenum, pancreas, and  spleen are normal in appearance. The adrenal glands and kidneys exhibit no acute abnormalities. On the left and there is an exophytic midpole hypodensity measuring 4.6 cm in greatest dimension with Hounsfield measurement of -4 most compatible with a cyst. On the right there is a lower pole hypodensity measuring 3 cm in diameter which exhibits Hounsfield measurement of +19. These findings are not new The caliber of the abdominal aorta exhibits mild failure to taper but there is no evidence of an aneurysm. The partially distended urinary bladder is normal in appearance. The prostate gland is enlarged and produces an impression upon the urinary bladder base. There is no free fluid in the abdomen or pelvis. There is no retroperitoneal hemorrhage. There is no inguinal nor umbilical hernia. The non-opacified loops of small and large bowel exhibit a normal stool and gas pattern.  The lumbar vertebral bodies are preserved in height. There is degenerative facet joint change at multiple levels. The bony pelvis appears intact. The observed  portions of the proximal femurs appear normal.  IMPRESSION: 1. There is no evidence of a pulmonary contusion or pneumothorax or pneumomediastinum in this patient who has sustained fractures of the lateral aspects of the left seventh, eighth, and ninth ribs. 2. There is no evidence of a mediastinal hematoma nor of a pericardial effusion. 3. There are coronary artery calcifications present. 4. There is no evidence of acute visceral injury within the abdomen or pelvis. 5. There is no acute hepatobiliary nor acute urinary tract abnormalities. There are likely cysts within both kidneys. There is mild right renal atrophy. The small and large bowel exhibit no acute abnormalities.   Electronically Signed   By: David  Martinique   On: 08/19/2013 22:10   Ct Cervical Spine Wo Contrast  08/19/2013   CLINICAL DATA:  Fall  EXAM: CT HEAD WITHOUT CONTRAST  CT CERVICAL SPINE WITHOUT CONTRAST  TECHNIQUE: Multidetector CT imaging of the head and cervical spine was performed following the standard protocol without intravenous contrast. Multiplanar CT image reconstructions of the cervical spine were also generated.  COMPARISON:  Prior CT from 04/26/2013.  FINDINGS: CT HEAD FINDINGS  The age-related atrophy is unchanged. Scattered and confluent hypodensity within the periventricular and deep white matter of the stroke hemispheres is most consistent with chronic small vessel ischemic changes. Remote lacunar infarct noted within the right basal ganglia.  There is no acute intracranial hemorrhage or infarct. No mass lesion or midline shift. Gray-white matter differentiation is well maintained. Ventricles are normal in size without evidence of hydrocephalus. CSF containing spaces are within normal limits. No extra-axial fluid collection.  The calvarium is intact.  Orbital soft tissues are within normal limits.  The paranasal sinuses and mastoid air cells are well pneumatized and free of fluid.  Scalp soft tissues are unremarkable.  CT  CERVICAL SPINE FINDINGS  The vertebral bodies are normally aligned with preservation of the normal cervical lordosis. Vertebral body heights are preserved. Normal C1-2 articulations are intact. No prevertebral soft tissue swelling. No acute fracture or listhesis.  Moderate degenerative disc disease as evidenced by intervertebral disc space narrowing and endplate osteophytosis present at C5-6.  Visualized soft tissues of the neck are within normal limits. Visualized lung apices are clear without evidence of apical pneumothorax.  IMPRESSION: CT BRAIN:  1. No acute intracranial process. 2. Age-related atrophy with moderate chronic microvascular ischemic disease, unchanged. 3. Remote lacunar infarct within the right basal ganglia, unchanged.  CT CERVICAL SPINE:  1. No acute traumatic injury within the cervical  spine. 2. Moderate degenerative disc disease at C5-6 and C6-7.   Electronically Signed   By: Jeannine Boga M.D.   On: 08/19/2013 21:57     EKG Interpretation None     On repeat exam the patient appears comfortable. I discussed all findings with the patient his wife and a family member. We discussed precautions, follow up instructions.  MDM   This patient with dementia presents after a mechanical fall with pain in the left side.  Patient's evaluation demonstrates 3 rib fractures, no pneumothorax. Patient is hemodynamically stable, and had pain controlled here with analgesics. After a lengthy conversation with the patient's family members about return precautions, follow up instructions he was discharged in stable condition to follow up with primary care.    Carmin Muskrat, MD 08/19/13 2232

## 2013-08-19 NOTE — ED Notes (Signed)
Pt fell off bed while changing a light bulb. Pt c/o left rib pain.

## 2013-08-19 NOTE — Discharge Instructions (Signed)
Rib Fracture  A rib fracture is a break or crack in one of the bones of the ribs. The ribs are a group of long, curved bones that wrap around your chest and attach to your spine. They protect your lungs and other organs in the chest cavity. A broken or cracked rib is often painful, but most do not cause other problems. Most rib fractures heal on their own over time. However, rib fractures can be more serious if multiple ribs are broken or if broken ribs move out of place and push against other structures.  CAUSES   · A direct blow to the chest. For example, this could happen during contact sports, a car accident, or a fall against a hard object.  · Repetitive movements with high force, such as pitching a baseball or having severe coughing spells.  SYMPTOMS   · Pain when you breathe in or cough.  · Pain when someone presses on the injured area.  DIAGNOSIS   Your caregiver will perform a physical exam. Various imaging tests may be ordered to confirm the diagnosis and to look for related injuries. These tests may include a chest X-ray, computed tomography (CT), magnetic resonance imaging (MRI), or a bone scan.  TREATMENT   Rib fractures usually heal on their own in 1 3 months. The longer healing period is often associated with a continued cough or other aggravating activities. During the healing period, pain control is very important. Medication is usually given to control pain. Hospitalization or surgery may be needed for more severe injuries, such as those in which multiple ribs are broken or the ribs have moved out of place.   HOME CARE INSTRUCTIONS   · Avoid strenuous activity and any activities or movements that cause pain. Be careful during activities and avoid bumping the injured rib.  · Gradually increase activity as directed by your caregiver.  · Only take over-the-counter or prescription medications as directed by your caregiver. Do not take other medications without asking your caregiver first.  · Apply ice  to the injured area for the first 1 2 days after you have been treated or as directed by your caregiver. Applying ice helps to reduce inflammation and pain.  · Put ice in a plastic bag.  · Place a towel between your skin and the bag.    · Leave the ice on for 15 20 minutes at a time, every 2 hours while you are awake.  · Perform deep breathing as directed by your caregiver. This will help prevent pneumonia, which is a common complication of a broken rib. Your caregiver may instruct you to:  · Take deep breaths several times a day.  · Try to cough several times a day, holding a pillow against the injured area.  · Use a device called an incentive spirometer to practice deep breathing several times a day.  · Drink enough fluids to keep your urine clear or pale yellow. This will help you avoid constipation.    · Do not wear a rib belt or binder. These restrict breathing, which can lead to pneumonia.    SEEK IMMEDIATE MEDICAL CARE IF:   · You have a fever.    · You have difficulty breathing or shortness of breath.    · You develop a continual cough, or you cough up thick or bloody sputum.  · You feel sick to your stomach (nausea), throw up (vomit), or have abdominal pain.    · You have worsening pain not controlled with medications.      MAKE SURE YOU:  · Understand these instructions.  · Will watch your condition.  · Will get help right away if you are not doing well or get worse.  Document Released: 04/09/2005 Document Revised: 12/10/2012 Document Reviewed: 06/11/2012  [employer]® Patient Information ©2014 [employer].

## 2013-11-17 ENCOUNTER — Ambulatory Visit: Payer: Medicare Other | Admitting: Cardiovascular Disease

## 2013-11-19 ENCOUNTER — Ambulatory Visit (INDEPENDENT_AMBULATORY_CARE_PROVIDER_SITE_OTHER): Payer: Medicare Other | Admitting: Cardiovascular Disease

## 2013-11-19 ENCOUNTER — Encounter: Payer: Self-pay | Admitting: Cardiovascular Disease

## 2013-11-19 VITALS — BP 124/72 | HR 49 | Ht 69.0 in | Wt 200.0 lb

## 2013-11-19 DIAGNOSIS — I219 Acute myocardial infarction, unspecified: Secondary | ICD-10-CM

## 2013-11-19 DIAGNOSIS — I2510 Atherosclerotic heart disease of native coronary artery without angina pectoris: Secondary | ICD-10-CM

## 2013-11-19 NOTE — Progress Notes (Signed)
HPI:  80 year old gentleman presenting for hospital followup evaluation. The patient is followed for coronary artery disease. He has undergone stenting of the left circumflex and obtuse marginal branches. His last PCI procedure was in 2010 when he underwent cutting balloon angioplasty for treatment of severe in-stent restenosis. At that time he was noted to have moderate 50-60% LAD stenosis which was unchanged from his previous catheterization study from 2003. He has been managed medically. His left ventricular ejection fraction has been preserved.  The patient has been diagnosed with dementia. He's undergone formal neurologic evaluation. He was tried on Aricept and another medicine, but he had confusion with both of these medications. He has declined further medication.   From a cardiac perspective, the patient is doing fine. He denies chest pain, shortness of breath, leg swelling, orthopnea, palpitations, lightheadedness, or syncope.  Outpatient Encounter Prescriptions as of 11/19/2013  Medication Sig  . Ascorbic Acid (VITAMIN C) 500 MG tablet Take 500 mg by mouth daily.    Marland Kitchen aspirin EC 81 MG tablet Take 81 mg by mouth daily.  . cholecalciferol (D-VI-SOL) 400 UNIT/ML LIQD Take 400 Units by mouth daily.  . Coenzyme Q10 (COQ10) 100 MG CAPS Take 1 capsule by mouth daily.   . Melatonin 5 MG TABS Take 5-10 mg by mouth at bedtime.  . metoprolol (LOPRESSOR) 50 MG tablet Take 25 mg by mouth 2 (two) times daily.  . nitroGLYCERIN (NITROSTAT) 0.4 MG SL tablet Place 0.4 mg under the tongue every 5 (five) minutes as needed for chest pain.  . Red Yeast Rice 600 MG CAPS Take 1 capsule by mouth 2 (two) times daily.    Marland Kitchen VITAMIN E BLEND PO Take 2,000 mg by mouth daily.  . [DISCONTINUED] HYDROcodone-acetaminophen (NORCO/VICODIN) 5-325 MG per tablet Take 2 tablets by mouth every 4 (four) hours as needed.    Allergies  Allergen Reactions  . Aricept [Donepezil Hcl] Other (See Comments)    Altered mental  status  . Atorvastatin Nausea And Vomiting and Other (See Comments)    Joint pain  . Simvastatin Nausea And Vomiting and Other (See Comments)    Joint pain  . Sorbitan Nausea And Vomiting    headache    Past Medical History  Diagnosis Date  . Hyperlipidemia   . Myocardial infarction   . Coronary atherosclerosis of native coronary artery     BMS circumflex and OM 2006, PTCA ISR 2010, moderate LAD disease  . DJD (degenerative joint disease)   . Macular degeneration   . Alzheimer's dementia     ROS: Negative except as per HPI  BP 124/72  Pulse 49  Ht 5\' 9"  (1.753 m)  Wt 200 lb (90.719 kg)  BMI 29.52 kg/m2  PHYSICAL EXAM: Pt is alert and oriented, pleasant elderly gentleman in NAD HEENT: normal Neck: JVP - normal, carotids 2+= without bruits Lungs: CTA bilaterally CV: Bradycardic and regular without murmur or gallop Abd: soft, NT, Positive BS, no hepatomegaly Ext: no C/C/E, distal pulses intact and equal Skin: warm/dry no rash  EKG:  Sinus bradycardia 49 beats per minute, right bundle branch block, first degree AV block.  ASSESSMENT AND PLAN: 1. Coronary artery disease, native vessel. Stable without symptoms of angina. When I saw him last in September 2014 we discussed stress testing because he was having chest pain. He's had no recurrence. Considering his progressive problems with dementia and lack of cardiac symptoms, I think continued observation is appropriate. His medications were reviewed and will be continued.  2. Hyperlipidemia. The patient is statin intolerant. He continues to take red yeast rice.  3. Pre-syncope. The patient has had no recurrence. He declined testing at the time of his last office visit.  For followup I will see him back in one year.  [name] 11/19/2013 12:33 PM

## 2013-11-19 NOTE — Patient Instructions (Signed)
Your physician recommends that you continue on your current medications as directed. Please refer to the Current Medication list given to you today.  Your physician wants you to follow-up in: 1 year with Dr. Cooper.  You will receive a reminder letter in the mail two months in advance. If you don't receive a letter, please call our office to schedule the follow-up appointment.   

## 2013-11-20 ENCOUNTER — Encounter: Payer: Self-pay | Admitting: Cardiovascular Disease

## 2014-03-05 ENCOUNTER — Observation Stay (HOSPITAL_COMMUNITY): Payer: Medicare Other

## 2014-03-05 ENCOUNTER — Emergency Department (HOSPITAL_COMMUNITY): Payer: Medicare Other

## 2014-03-05 ENCOUNTER — Observation Stay (HOSPITAL_COMMUNITY)
Admission: EM | Admit: 2014-03-05 | Discharge: 2014-03-06 | Disposition: A | Discharge status: Home / Self Care | Payer: Medicare Other | Attending: Internal Medicine | Admitting: Internal Medicine

## 2014-03-05 ENCOUNTER — Encounter (HOSPITAL_COMMUNITY): Payer: Self-pay | Admitting: Emergency Medicine

## 2014-03-05 DIAGNOSIS — M1990 Unspecified osteoarthritis, unspecified site: Secondary | ICD-10-CM | POA: Diagnosis not present

## 2014-03-05 DIAGNOSIS — R51 Headache: Secondary | ICD-10-CM | POA: Insufficient documentation

## 2014-03-05 DIAGNOSIS — G309 Alzheimer's disease, unspecified: Secondary | ICD-10-CM | POA: Diagnosis not present

## 2014-03-05 DIAGNOSIS — G459 Transient cerebral ischemic attack, unspecified: Secondary | ICD-10-CM | POA: Diagnosis not present

## 2014-03-05 DIAGNOSIS — I252 Old myocardial infarction: Secondary | ICD-10-CM | POA: Insufficient documentation

## 2014-03-05 DIAGNOSIS — E785 Hyperlipidemia, unspecified: Secondary | ICD-10-CM | POA: Insufficient documentation

## 2014-03-05 DIAGNOSIS — I2510 Atherosclerotic heart disease of native coronary artery without angina pectoris: Secondary | ICD-10-CM | POA: Diagnosis not present

## 2014-03-05 DIAGNOSIS — H3530 Unspecified macular degeneration: Secondary | ICD-10-CM | POA: Diagnosis not present

## 2014-03-05 DIAGNOSIS — R42 Dizziness and giddiness: Secondary | ICD-10-CM | POA: Diagnosis not present

## 2014-03-05 DIAGNOSIS — N183 Chronic kidney disease, stage 3 unspecified: Secondary | ICD-10-CM | POA: Diagnosis present

## 2014-03-05 DIAGNOSIS — K2980 Duodenitis without bleeding: Secondary | ICD-10-CM | POA: Diagnosis present

## 2014-03-05 DIAGNOSIS — Z87891 Personal history of nicotine dependence: Secondary | ICD-10-CM | POA: Diagnosis not present

## 2014-03-05 DIAGNOSIS — R531 Weakness: Secondary | ICD-10-CM | POA: Diagnosis present

## 2014-03-05 DIAGNOSIS — I10 Essential (primary) hypertension: Secondary | ICD-10-CM | POA: Diagnosis present

## 2014-03-05 DIAGNOSIS — Z79899 Other long term (current) drug therapy: Secondary | ICD-10-CM | POA: Diagnosis not present

## 2014-03-05 DIAGNOSIS — Z7982 Long term (current) use of aspirin: Secondary | ICD-10-CM | POA: Insufficient documentation

## 2014-03-05 DIAGNOSIS — I672 Cerebral atherosclerosis: Secondary | ICD-10-CM | POA: Diagnosis present

## 2014-03-05 HISTORY — DX: Cerebral atherosclerosis: I67.2

## 2014-03-05 HISTORY — DX: Chronic kidney disease, stage 3 unspecified: N18.30

## 2014-03-05 HISTORY — DX: Chronic kidney disease, stage 3 (moderate): N18.3

## 2014-03-05 HISTORY — DX: Transient cerebral ischemic attack, unspecified: G45.9

## 2014-03-05 HISTORY — DX: Personal history of transient ischemic attack (TIA), and cerebral infarction without residual deficits: Z86.73

## 2014-03-05 LAB — COMPREHENSIVE METABOLIC PANEL
ALT: 12 U/L (ref 0–53)
ANION GAP: 9 (ref 5–15)
AST: 14 U/L (ref 0–37)
Albumin: 3.8 g/dL (ref 3.5–5.2)
Alkaline Phosphatase: 73 U/L (ref 39–117)
BUN: 27 mg/dL — AB (ref 6–23)
CO2: 29 mEq/L (ref 19–32)
CREATININE: 1.54 mg/dL — AB (ref 0.50–1.35)
Calcium: 9.3 mg/dL (ref 8.4–10.5)
Chloride: 102 mEq/L (ref 96–112)
GFR calc non Af Amer: 40 mL/min — ABNORMAL LOW (ref >90)
GFR, EST AFRICAN AMERICAN: 46 mL/min — AB (ref >90)
GLUCOSE: 93 mg/dL (ref 70–99)
Potassium: 4.8 mEq/L (ref 3.7–5.3)
Sodium: 140 mEq/L (ref 137–147)
TOTAL PROTEIN: 7.5 g/dL (ref 6.0–8.3)
Total Bilirubin: 0.5 mg/dL (ref 0.3–1.2)

## 2014-03-05 LAB — APTT: aPTT: 26 seconds (ref 24–37)

## 2014-03-05 LAB — URINALYSIS, ROUTINE W REFLEX MICROSCOPIC
Bilirubin Urine: NEGATIVE
Glucose, UA: NEGATIVE mg/dL
Hgb urine dipstick: NEGATIVE
Ketones, ur: NEGATIVE mg/dL
LEUKOCYTES UA: NEGATIVE
NITRITE: NEGATIVE
Protein, ur: NEGATIVE mg/dL
UROBILINOGEN UA: 0.2 mg/dL (ref 0.0–1.0)
pH: 6.5 (ref 5.0–8.0)

## 2014-03-05 LAB — TROPONIN I: Troponin I: <0.3 ng/mL (ref <0.30)

## 2014-03-05 LAB — CBC
HCT: 44.8 % (ref 39.0–52.0)
HEMOGLOBIN: 14.7 g/dL (ref 13.0–17.0)
MCH: 30.9 pg (ref 26.0–34.0)
MCHC: 32.8 g/dL (ref 30.0–36.0)
MCV: 94.3 fL (ref 78.0–100.0)
Platelets: 212 10*3/uL (ref 150–400)
RBC: 4.75 MIL/uL (ref 4.22–5.81)
RDW: 13.1 % (ref 11.5–15.5)
WBC: 7.1 10*3/uL (ref 4.0–10.5)

## 2014-03-05 LAB — DIFFERENTIAL
BASOS PCT: 0 % (ref 0–1)
Basophils Absolute: 0 10*3/uL (ref 0.0–0.1)
EOS PCT: 3 % (ref 0–5)
Eosinophils Absolute: 0.2 10*3/uL (ref 0.0–0.7)
LYMPHS ABS: 1.5 10*3/uL (ref 0.7–4.0)
Lymphocytes Relative: 21 % (ref 12–46)
MONOS PCT: 9 % (ref 3–12)
Monocytes Absolute: 0.6 10*3/uL (ref 0.1–1.0)
Neutro Abs: 4.8 10*3/uL (ref 1.7–7.7)
Neutrophils Relative %: 67 % (ref 43–77)

## 2014-03-05 LAB — PROTIME-INR
INR: 1.12 (ref 0.00–1.49)
PROTHROMBIN TIME: 14.5 s (ref 11.6–15.2)

## 2014-03-05 MED ORDER — ASPIRIN EC 81 MG PO TBEC
81.0000 mg | DELAYED_RELEASE_TABLET | Freq: Every day | ORAL | Status: DC
  Administered 2014-03-06: 81 mg via ORAL
  Filled 2014-03-05: qty 1

## 2014-03-05 MED ORDER — VITAMIN C 500 MG PO TABS
500.0000 mg | ORAL_TABLET | Freq: Every day | ORAL | Status: DC
  Administered 2014-03-05 – 2014-03-06 (×2): 500 mg via ORAL
  Filled 2014-03-05 (×2): qty 1

## 2014-03-05 MED ORDER — POTASSIUM CHLORIDE IN NACL 20-0.9 MEQ/L-% IV SOLN
INTRAVENOUS | Status: DC
  Administered 2014-03-05 – 2014-03-06 (×2): via INTRAVENOUS

## 2014-03-05 MED ORDER — DIPHENHYDRAMINE HCL 12.5 MG/5ML PO ELIX
12.5000 mg | ORAL_SOLUTION | Freq: Once | ORAL | Status: AC
  Administered 2014-03-05: 12.5 mg via ORAL
  Filled 2014-03-05: qty 5

## 2014-03-05 MED ORDER — HEPARIN SODIUM (PORCINE) 5000 UNIT/ML IJ SOLN
5000.0000 [IU] | Freq: Three times a day (TID) | INTRAMUSCULAR | Status: DC
  Administered 2014-03-05 – 2014-03-06 (×3): 5000 [IU] via SUBCUTANEOUS
  Filled 2014-03-05 (×3): qty 1

## 2014-03-05 MED ORDER — MECLIZINE HCL 12.5 MG PO TABS: 12.5000 mg | ORAL_TABLET | Freq: Three times a day (TID) | ORAL | Status: DC | PRN

## 2014-03-05 MED ORDER — METOPROLOL TARTRATE 25 MG PO TABS
25.0000 mg | ORAL_TABLET | Freq: Two times a day (BID) | ORAL | Status: DC
  Administered 2014-03-05 – 2014-03-06 (×2): 25 mg via ORAL
  Filled 2014-03-05 (×2): qty 1

## 2014-03-05 MED ORDER — NITROGLYCERIN 0.4 MG SL SUBL: 0.4000 mg | SUBLINGUAL_TABLET | SUBLINGUAL | Status: DC | PRN

## 2014-03-05 MED ORDER — STROKE: EARLY STAGES OF RECOVERY BOOK
Freq: Once | Status: DC
  Filled 2014-03-05: qty 1

## 2014-03-05 MED ORDER — ONDANSETRON HCL 4 MG/2ML IJ SOLN: 4.0000 mg | Freq: Four times a day (QID) | INTRAMUSCULAR | Status: DC | PRN

## 2014-03-05 MED ORDER — DIAZEPAM 2 MG PO TABS
2.0000 mg | ORAL_TABLET | Freq: Once | ORAL | Status: AC
  Administered 2014-03-05: 2 mg via ORAL
  Filled 2014-03-05: qty 1

## 2014-03-05 MED ORDER — SODIUM CHLORIDE 0.9 % IV SOLN: INTRAVENOUS | Status: DC

## 2014-03-05 MED ORDER — LORAZEPAM 0.5 MG PO TABS
0.5000 mg | ORAL_TABLET | Freq: Once | ORAL | Status: DC
  Filled 2014-03-05: qty 1

## 2014-03-05 MED ORDER — VITAMIN B-12 1000 MCG PO TABS
500.0000 ug | ORAL_TABLET | Freq: Every day | ORAL | Status: DC
  Administered 2014-03-05 – 2014-03-06 (×2): 500 ug via ORAL
  Filled 2014-03-05 (×3): qty 1

## 2014-03-05 MED ORDER — ACETAMINOPHEN 325 MG PO TABS
650.0000 mg | ORAL_TABLET | ORAL | Status: DC | PRN
  Administered 2014-03-06: 650 mg via ORAL
  Filled 2014-03-05: qty 2

## 2014-03-05 MED ORDER — BENZONATATE 100 MG PO CAPS: 100.0000 mg | ORAL_CAPSULE | Freq: Three times a day (TID) | ORAL | Status: DC | PRN

## 2014-03-05 NOTE — H&P (Signed)
[facility] History and Physical  CLINT BIELLO ZOX:[id number] DOB: [birth year] DOA: 03/05/2014  Referring physician: ED physician, Dr. Leonides Schanz PCP: Glo Herring., MD   Chief Complaint: generalized weakness, "garbled speech".  HPI: David Collier is a 80 y.o. male with a history of presumed Alzheimer's dementia, coronary artery disease with a history of stenting, chronic kidney disease, degenerative joint disease, and hypertension. He presents to the emergency department today with a report of generalized weakness, dizziness, and "garbled speech". The patient has chronic dementia and has difficulty providing some history. He does acknowledge that his memory is compromised. Most of the history is provided by his wife. Accordingly, the patient has had worsening forgetfulness which she attributes to Alzheimer's dementia that was diagnosed a little more than a year ago. He has had a steady decline in his cognition. However, over the past couple days, he has been complaining of intermittent dizziness and global headaches. Today, he had an episode of "garbled" speech which lasted approximately 15 minutes and then resolved spontaneously. Shortly thereafter, he had a crying episode without any particular trigger. There was no witnessed shaking activity or seizure-like activity. She had not noticed any facial droop, focal unilateral weakness, bladder/bowel incontinence. He cannot describe the dizziness as spinning or off-balance. He denies chest pain, shortness of breath, abdominal pain, diarrhea, pain with urination, or difficulty swallowing.  In the ED, he was noted to be afebrile and hemodynamically stable. His EKG revealed right bundle branch block, first-degree AV block, and sinus rhythm. CT of his head revealed chronic changes without acute abnormality. MRI of his brain revealed chronic small and medium size vessel ischemia-mild to moderate for age; small chronic lacunar infarct in the left PICA  territory of the cerebellum. His lab data are significant for BUN of 27 and a creatinine of 1.54. He is being admitted for further evaluation and management.    Review of Systems:  As above in history of present illness, otherwise negative.  Past Medical History  Diagnosis Date  . Hyperlipidemia   . Myocardial infarction   . Coronary atherosclerosis of native coronary artery     BMS circumflex and OM 2006, PTCA ISR 2010, moderate LAD disease  . DJD (degenerative joint disease)   . Macular degeneration   . Alzheimer's dementia   . CKD (chronic kidney disease), stage III   . History of stroke     Seen radiographically-left PICA; unknown to patient.   Past Surgical History  Procedure Laterality Date  . Appendectomy     Social History: he is married. He has 3 children. He is a retired [employer] manager. He quit smoking about 47 years ago. His smoking use included Cigarettes. He smoked 0.00 packs per day. He does not have any smokeless tobacco history on file. He reports that he does not drink alcohol or use illicit drugs.  Allergies  Allergen Reactions  . Aricept [Donepezil Hcl] Other (See Comments)    Altered mental status  . Atorvastatin Nausea And Vomiting and Other (See Comments)    Joint pain  . Simvastatin Nausea And Vomiting and Other (See Comments)    Joint pain  . Sorbitan Nausea And Vomiting    headache    Family History  Problem Relation Age of Onset  . Heart failure       Prior to Admission medications   Medication Sig Start Date End Date Taking? Authorizing Provider  Ascorbic Acid (VITAMIN C) 500 MG tablet Take 500 mg by mouth daily.  Yes Historical Provider, MD  aspirin EC 81 MG tablet Take 81 mg by mouth daily.   Yes Historical Provider, MD  Coenzyme Q10 (COQ10) 100 MG CAPS Take 1 capsule by mouth daily.    Yes Historical Provider, MD  Cyanocobalamin (VITAMIN B-12 PO) Take 1 tablet by mouth daily.   Yes Historical Provider, MD  Ferrous Fumarate (IRON) 18 MG TBCR  Take 1 tablet by mouth daily.   Yes Historical Provider, MD  ibuprofen (ADVIL,MOTRIN) 200 MG tablet Take 400-600 mg by mouth every 6 (six) hours as needed for headache.   Yes Historical Provider, MD  Melatonin 5 MG TABS Take 5-10 mg by mouth daily as needed (sleep).    Yes Historical Provider, MD  metoprolol (LOPRESSOR) 50 MG tablet Take 25 mg by mouth 2 (two) times daily.   Yes Historical Provider, MD  Misc Natural Products (SUPER GREENS PO) Take 5 tablets by mouth at bedtime.   Yes Historical Provider, MD  nitroGLYCERIN (NITROSTAT) 0.4 MG SL tablet Place 0.4 mg under the tongue every 5 (five) minutes as needed for chest pain.   Yes Historical Provider, MD  Red Yeast Rice 600 MG CAPS Take 1 capsule by mouth 2 (two) times daily.     Yes Historical Provider, MD  traMADol (ULTRAM) 50 MG tablet Take 50 mg by mouth every 8 (eight) hours as needed for moderate pain.   Yes Historical Provider, MD  VITAMIN E BLEND PO Take 1,000 mg by mouth daily.    Yes Historical Provider, MD  cholecalciferol (D-VI-SOL) 400 UNIT/ML LIQD Take 400 Units by mouth daily.    Historical Provider, MD   Physical Exam: Filed Vitals:   03/05/14 1230 03/05/14 1300 03/05/14 1330 03/05/14 1743  BP: 121/69 116/72 128/69 154/96  Pulse:    78  Temp:    97.4 F (36.3 C)  TempSrc:    Oral  Resp: 15 14 14    Height:    5\' 9"  (1.753 m)  Weight:    95.255 kg (210 lb)  SpO2:    97%    Wt Readings from Last 3 Encounters:  03/05/14 95.255 kg (210 lb)  03/05/14 99.791 kg (220 lb)  11/19/13 90.719 kg (200 lb)    General:  Appears calm and comfortable; pleasant elderly 80 year old Caucasian man sitting up in the chair, in no acute distress. Eyes: PERRL, normal lids, irises & conjunctiva ENT: grossly normal hearing, lips & tongue; mildly dry oropharynx mucous membranes. Neck: no LAD, masses or thyromegaly; no audible bruit. Cardiovascular: RRR, no m/r/g. No LE edema. Telemetry: SR, no arrhythmias  Respiratory: CTA bilaterally, no  w/r/r. Normal respiratory effort. Abdomen: positive bowel sounds, soft, nontender, nondistended. Skin: no rash or induration seen on limited exam Musculoskeletal: grossly normal tone BUE/BLE Psychiatric: he teared occasionally, but otherwise his speech was clear. He did have difficulty recalling recent events. Neurologic: cranial nerves II through XII are grossly intact. Gait is within normal limits with no evidence of ataxia. No nystagmus. Globally, strength is 5 over 5 of the upper and lower extremities bilaterally. Sensation is grossly intact.            Labs on Admission:  Basic Metabolic Panel:  Recent Labs Lab 03/05/14 1147  NA 140  K 4.8  CL 102  CO2 29  GLUCOSE 93  BUN 27*  CREATININE 1.54*  CALCIUM 9.3   Liver Function Tests:  Recent Labs Lab 03/05/14 1147  AST 14  ALT 12  ALKPHOS 73  BILITOT 0.5  PROT 7.5  ALBUMIN 3.8   No results for input(s): LIPASE, AMYLASE in the last 168 hours. No results for input(s): AMMONIA in the last 168 hours. CBC:  Recent Labs Lab 03/05/14 1147  WBC 7.1  NEUTROABS 4.8  HGB 14.7  HCT 44.8  MCV 94.3  PLT 212   Cardiac Enzymes:  Recent Labs Lab 03/05/14 1147  TROPONINI <0.30    BNP (last 3 results)  Recent Labs  04/26/13 1325  PROBNP 346.5   CBG: No results for input(s): GLUCAP in the last 168 hours.  Radiological Exams on Admission: Ct Head Wo Contrast  03/05/2014   CLINICAL DATA:  Dizziness for 3 hrs, slurred speech and headache  EXAM: CT HEAD WITHOUT CONTRAST  TECHNIQUE: Contiguous axial images were obtained from the base of the skull through the vertex without intravenous contrast.  COMPARISON:  08/19/2013  FINDINGS: Bony calvarium is intact. Paranasal sinuses mastoid air cells are within normal limits. Mild atrophic changes are seen similar to that noted on the prior exam. Mild chronic white matter ischemic change is seen. Old lacunar infarcts are noted in the region of the basal ganglia bilaterally and  stable. No acute hemorrhage, acute infarction or space-occupying mass lesion are noted.  IMPRESSION: Chronic changes without acute abnormality.   Electronically Signed   By: Inez Catalina M.D.   On: 03/05/2014 12:49   Mr Brain Wo Contrast  03/05/2014   CLINICAL DATA:  80 year old male with dizziness and slurred speech. Transient ischemic attack. Initial encounter.  EXAM: MRI HEAD WITHOUT CONTRAST  TECHNIQUE: Multiplanar, multiecho pulse sequences of the brain and surrounding structures were obtained without intravenous contrast.  COMPARISON:  Head CT without contrast 1248 hr today and earlier.  FINDINGS: Cerebral volume is within normal limits for age. No restricted diffusion to suggest acute infarction. No midline shift, mass effect, evidence of mass lesion, ventriculomegaly, extra-axial collection or acute intracranial hemorrhage. Cervicomedullary junction and pituitary are within normal limits. Negative visualized cervical spine. Major intracranial vascular flow voids are within normal limits.  Small chronic lacunar infarct in the left PICA territory of the cerebellum. Mild for age T2 heterogeneity in the deep gray matter nuclei, with evidence of a small chronic lacunar infarct near the posterior limb of the right internal capsule (series 7, image 13). Otherwise mild for age nonspecific cerebral white matter T2 and FLAIR hyperintensity.  Grossly normal visualized internal auditory structures. Mastoids are clear. Mild paranasal sinus mucosal thickening. Postoperative changes to both globes. Visualized scalp soft tissues are within normal limits. Normal bone marrow signal.  IMPRESSION: 1.  No acute intracranial abnormality. 2. Chronic small and medium-sized vessel ischemia, extent mild to moderate for age.   Electronically Signed   By: Lars Pinks M.D.   On: 03/05/2014 15:12    EKG: Independently reviewed. Normal sinus rhythm, right bundle branch block, mild LVH, first-degree AV  block.  Assessment/Plan Principal Problem:   TIA (transient ischemic attack) Active Problems:   Essential hypertension   Coronary atherosclerosis   Arteriosclerotic cerebrovascular disease   CKD (chronic kidney disease), stage III   1. This is an 80 year old man with a history of Alzheimer's dementia, coronary artery disease, and chronic kidney disease, who presents with dysarthria and generalized weakness. It appears that he has progressive dementia which is likely Alzheimer's, but could be multi-infarct dementia given the findings on the MRI. There is no evidence of an acute stroke, but he does have significant atherosclerotic cerebrovascular disease. He takes an aspirin daily. He is on  metoprolol for essential hypertension and coronary artery disease. On exam, there is no focal weakness, but he does have a decrease in short and long-term memory. His symptomatology appears to be consistent with a TIA-dysarthria has resolved. No obvious infective symptoms. His urinalysis is negative. Chest x-ray has been ordered and is pending.   Plan: 1. Will continue aspirin at 81 mg daily. Will consider increasing it to 162 mg, pending the results of the 2-D echocardiogram and carotid ultrasound. 2. Will start gentle IV fluids. 3. Will hold his nutritional/otherwise supplements. Will continue metoprolol. 4. For further evaluation, will order cardiac enzymes, TSH, vitamin B12, RPR, carotid ultrasound, 2-D echocardiogram. We'll also assess his fasting lipid panel. (He is intolerant to statins). 5. Was considering Aricept or Namenda, but the patient was unable to tolerate these medications in the past.    Code Status: full code DVT Prophylaxis:subcutaneous heparin Family Communication: Discussed with wife Disposition Plan: anticipate discharge to home tomorrow pending the results of the workup.  Time spent: one hour  [facility] Pager [phone]

## 2014-03-05 NOTE — ED Provider Notes (Signed)
This chart was scribed for [place], DO by Delphia Grates, ED Scribe. This patient was seen in room APA02/APA02 and the patient's care was started at 11:22 AM.   TIME SEEN: 11:22 AM  CHIEF COMPLAINT: Weakness  HPI: HPI Comments: David Collier is a 80 y.o. male, with history of hypertension, HLD, coronary artery disease status post 3 stents, and dementia who presents to the Emergency Department with his wife complaining of intermittent episodes of dizziness and headache. She reports he had an episode today of "garbled" speech that lasted for approximately 15 minutes and then resolved. She states afterward he had a crying spell. He has not had any head injury recently. Not on anticoagulation.  Patient is currently taking 81mg  aspirin. No numbness, tingling or focal weakness.  Patient denies any pain at present, and states his HA has resolved. Wife denies history of DM or A-fib.  No prior history of stroke or TIA.  PCP is [facility]  ROS: See HPI Constitutional: no fever  Eyes: no drainage  ENT: no runny nose   Cardiovascular:  no chest pain  Resp: no SOB  GI: no vomiting GU: no dysuria Integumentary: no rash  Allergy: no hives  Musculoskeletal: no leg swelling  Neurological: slurred speech ROS otherwise negative  PAST MEDICAL HISTORY/PAST SURGICAL HISTORY:  Past Medical History  Diagnosis Date  . Hyperlipidemia   . Myocardial infarction   . Coronary atherosclerosis of native coronary artery     BMS circumflex and OM 2006, PTCA ISR 2010, moderate LAD disease  . DJD (degenerative joint disease)   . Macular degeneration   . Alzheimer's dementia     MEDICATIONS:  Prior to Admission medications   Medication Sig Start Date End Date Taking? Authorizing Provider  Ascorbic Acid (VITAMIN C) 500 MG tablet Take 500 mg by mouth daily.      Historical Provider, MD  aspirin EC 81 MG tablet Take 81 mg by mouth daily.    Historical Provider, MD  cholecalciferol (D-VI-SOL) 400 UNIT/ML  LIQD Take 400 Units by mouth daily.    Historical Provider, MD  Coenzyme Q10 (COQ10) 100 MG CAPS Take 1 capsule by mouth daily.     Historical Provider, MD  Melatonin 5 MG TABS Take 5-10 mg by mouth at bedtime.    Historical Provider, MD  metoprolol (LOPRESSOR) 50 MG tablet Take 25 mg by mouth 2 (two) times daily.    Historical Provider, MD  nitroGLYCERIN (NITROSTAT) 0.4 MG SL tablet Place 0.4 mg under the tongue every 5 (five) minutes as needed for chest pain.    Historical Provider, MD  Red Yeast Rice 600 MG CAPS Take 1 capsule by mouth 2 (two) times daily.      Historical Provider, MD  VITAMIN E BLEND PO Take 2,000 mg by mouth daily.    Historical Provider, MD    ALLERGIES:  Allergies  Allergen Reactions  . Aricept [Donepezil Hcl] Other (See Comments)    Altered mental status  . Atorvastatin Nausea And Vomiting and Other (See Comments)    Joint pain  . Simvastatin Nausea And Vomiting and Other (See Comments)    Joint pain  . Sorbitan Nausea And Vomiting    headache    SOCIAL HISTORY:  History  Substance Use Topics  . Smoking status: Former Smoker    Types: Cigarettes    Quit date: 04/23/1966  . Smokeless tobacco: Not on file  . Alcohol Use: No    FAMILY HISTORY: Family History  Problem  Relation Age of Onset  . Heart failure      EXAM: BP 160/72 mmHg  Pulse 66  Temp(Src) 98.6 F (37 C)  Resp 18  Ht 6\' 2"  (1.88 m)  Wt 220 lb (99.791 kg)  BMI 28.23 kg/m2  SpO2 100% CONSTITUTIONAL: Alert and oriented to person and place, but not to time. and responds appropriately to questions. Well-appearing; well-nourished HEAD: Normocephalic EYES: Conjunctivae clear, PERRL ENT: normal nose; no rhinorrhea; moist mucous membranes; pharynx without lesions noted NECK: Supple, no meningismus, no LAD  CARD: RRR; S1 and S2 appreciated; no murmurs, no clicks, no rubs, no gallops RESP: Normal chest excursion without splinting or tachypnea; breath sounds clear and equal bilaterally; no  wheezes, no rhonchi, no rales,  ABD/GI: Normal bowel sounds; non-distended; soft, non-tender, no rebound, no guarding BACK:  The back appears normal and is non-tender to palpation, there is no CVA tenderness EXT: Normal ROM in all joints; non-tender to palpation; no edema; normal capillary refill; no cyanosis    SKIN: Normal color for age and race; warm NEURO: Moves all extremities equally; Sensation to light touch intact diffusely; cranial nerves II-XII intact; no dysmetria to finger-to-nose testing.  PSYCH: The patient's mood and manner are appropriate. Grooming and personal hygiene are appropriate.  MEDICAL DECISION MAKING: Pt here with episode of aphasia that resolved after 15 minutes. Concern for TIA. He has multiple risk factors for stroke. We'll obtain labs, urine, head CT. Anticipate admission.  ED PROGRESS: Pt's labs are unremarkable. Urine shows no sign of infection. Head CT shows chronic ischemic changes but no acute abnormality. We'll discuss with hospitalist for admission for TIA workup.    1:44 PM  D/w Dr. Caryn Section for admission to telemetry, observation.    EKG Interpretation  Date/Time:  Friday March 05 2014 11:09:49 EST Ventricular Rate:  67 PR Interval:    QRS Duration: 137 QT Interval:  422 QTC Calculation: 445 R Axis:   2 Text Interpretation:  Sinus rhythm with 1st degree AV block Right bundle branch block Left ventricular hypertrophy Confirmed by Keiley Levey,  DO, Beatrice Sehgal ([id number]) on 03/05/2014 11:38:36 AM        I personally performed the services described in this documentation, which was scribed in my presence. The recorded information has been reviewed and is accurate.     [place], DO 03/05/14 1344

## 2014-03-05 NOTE — ED Notes (Signed)
Attempted to give report to Charlynn Court, RN. RN stated he will call back in 29min after his lunch.

## 2014-03-05 NOTE — ED Notes (Addendum)
Per wife pt has had intermittent cp x a few days with c/o ha/blurred vision/slurred speech/dizziness at 0845 lasting approximately 15 minutes today. Wife also reports intermittent "dizzy spells" x 1 month. Pt has dementia.

## 2014-03-06 ENCOUNTER — Observation Stay (HOSPITAL_COMMUNITY): Payer: Medicare Other

## 2014-03-06 ENCOUNTER — Encounter (HOSPITAL_COMMUNITY): Payer: Self-pay | Admitting: Internal Medicine

## 2014-03-06 DIAGNOSIS — I517 Cardiomegaly: Secondary | ICD-10-CM

## 2014-03-06 DIAGNOSIS — G459 Transient cerebral ischemic attack, unspecified: Secondary | ICD-10-CM | POA: Diagnosis not present

## 2014-03-06 DIAGNOSIS — K2980 Duodenitis without bleeding: Secondary | ICD-10-CM | POA: Diagnosis present

## 2014-03-06 LAB — LIPID PANEL
Cholesterol: 187 mg/dL (ref 0–200)
HDL: 33 mg/dL — ABNORMAL LOW (ref >39)
LDL CALC: 129 mg/dL — AB (ref 0–99)
Total CHOL/HDL Ratio: 5.7 RATIO
Triglycerides: 125 mg/dL (ref <150)
VLDL: 25 mg/dL (ref 0–40)

## 2014-03-06 LAB — BASIC METABOLIC PANEL
ANION GAP: 11 (ref 5–15)
BUN: 24 mg/dL — ABNORMAL HIGH (ref 6–23)
CO2: 25 meq/L (ref 19–32)
CREATININE: 1.48 mg/dL — AB (ref 0.50–1.35)
Calcium: 9 mg/dL (ref 8.4–10.5)
Chloride: 106 mEq/L (ref 96–112)
GFR calc Af Amer: 49 mL/min — ABNORMAL LOW (ref >90)
GFR, EST NON AFRICAN AMERICAN: 42 mL/min — AB (ref >90)
GLUCOSE: 84 mg/dL (ref 70–99)
Potassium: 4.7 mEq/L (ref 3.7–5.3)
Sodium: 142 mEq/L (ref 137–147)

## 2014-03-06 LAB — TSH: TSH: 2.54 u[IU]/mL (ref 0.350–4.500)

## 2014-03-06 LAB — HEMOGLOBIN A1C
Hgb A1c MFr Bld: 5.5 % (ref <5.7)
Mean Plasma Glucose: 111 mg/dL (ref <117)

## 2014-03-06 LAB — RPR

## 2014-03-06 LAB — VITAMIN B12: VITAMIN B 12: 938 pg/mL — AB (ref 211–911)

## 2014-03-06 MED ORDER — MECLIZINE HCL 12.5 MG PO TABS: 12.5000 mg | ORAL_TABLET | Freq: Three times a day (TID) | ORAL | Status: DC | PRN

## 2014-03-06 MED ORDER — ASPIRIN EC 81 MG PO TBEC: 162.0000 mg | DELAYED_RELEASE_TABLET | Freq: Every day | ORAL | Status: DC

## 2014-03-06 MED ORDER — IBUPROFEN 200 MG PO TABS: 400.0000 mg | ORAL_TABLET | Freq: Four times a day (QID) | ORAL | Status: DC | PRN

## 2014-03-06 MED ORDER — FAMOTIDINE 10 MG PO TABS: 10.0000 mg | ORAL_TABLET | Freq: Every day | ORAL | Status: DC

## 2014-03-06 NOTE — Progress Notes (Signed)
Echocardiogram 2D Echocardiogram has been performed.  David Collier 03/06/2014, 9:17 AM

## 2014-03-06 NOTE — Discharge Summary (Signed)
Physician Discharge Summary  David Collier HGD:[id number] DOB: Jun 24, 1928 DOA: 03/05/2014  PCP: Glo Herring., MD  Admit date: 03/05/2014 Discharge date: 03/06/2014  Time spent: greater than 30 minutes  Recommendations for Outpatient Follow-up:  1.   Discharge Diagnoses:  1. TIA presented as transient dysarthria and dizziness. 2. Arteriosclerotic cerebrovascular disease, with history of posterior circulating lacunar infarct radiographically. 3. Dementia, likely Alzheimer's dementia, progressing. History of intolerance to Aricept and Namenda. 4. Essential hypertension. 5. Stage III chronic kidney disease, remained stable. 6. Coronary artery disease. Remained stable.  Discharge Condition: improved.  Diet recommendation: heart healthy.  Filed Weights   03/05/14 1110 03/05/14 1743  Weight: 99.791 kg (220 lb) 95.255 kg (210 lb)    History of present illness:  The patient is an 80 year old man with Alzheimer's dementia, coronary artery disease with a history of stenting, chronic kidney disease, hypertension, and degenerative joint disease. He presented to the emergency department on 03/05/14 with a report by his wife of generalized weakness, dizziness, and "garbled speech" that lasted approximately 15 minutes and then resolved. In the ED, he was noted to be afebrile and hemodynamically stable. His EKG revealed right bundle branch block, first-degree AV block, and sinus rhythm. CT of his head revealed chronic changes without acute abnormality. MRI of his brain revealed chronic small and medium size vessel ischemia-mild to moderate for age; small chronic lacunar infarct in the left PICA territory of the cerebellum. His lab data were significant for BUN of 27 and a creatinine of 1.54. He was admitted for further evaluation and management.   Hospital Course:  The patient was presumed to have a TIA. He had no focal neurological deficits. He had chronic, progressing Alzheimer's dementia,  per the history provided by his wife. He was maintained on 81 mg of aspirin daily with the intention to increase it to 162 mg pending further evaluation. A number of studies were ordered. MRI of his brain revealed chronic small and medium size vessel ischemia mild to moderate for age; and chronic small lacunar infarcts in the left PICA territory of the cerebellum and near the posterior limb of the right internal capsule. 2-D echocardiogram revealed preserved left ventricle systolic function with the ejection fraction of 96-22% and mild diastolic dysfunction. His carotid ultrasound revealed no significant ICA stenosis. Orthostatic vital signs were essentially negative. Troponin I was negative 2. TSH was within normal limits. Hemoglobin A1c was within normal limits at 5.5. RPR was nonreactive. Fasting lipid profile revealed a total cholesterol of 187, triglycerides of 125, HDL of 33, and LDL of 129. Statin therapy was considered, but he had been intolerant to statins in the past. Vitamin B12 level was pending at the time of discharge.  He remained afebrile and hemodynamically stable. His chronic kidney disease/creatinine was at baseline and improved slightly with hydration to 1.48. He was discharged on a slightly higher dose of aspirin at 162 mg daily. He was also given a prescription for meclizine to be used as needed for dizziness. Of note, he had been intolerant to Aricept and Namenda in the past for treatment of his dementia.  Procedures:  2-D echocardiogram 03/06/14: Study Conclusions - Left ventricle: The cavity size was normal. There was mild concentric hypertrophy. Systolic function was normal. The estimated ejection fraction was in the range of 60% to 65%. Wall motion was normal; there were no regional wall motion abnormalities. Doppler parameters are consistent with abnormal left ventricular relaxation (grade 1 diastolic dysfunction). The E/e&' ratio is <8, suggesting normal  LV  filling pressure. - Left atrium: The atrium was normal in size. - Right ventricle: The cavity size was mildly dilated. Systolic function was normal. Lateral annulus peak S velocity: 9.3 cm/s. - Right atrium: The atrium was at the upper limits of normal in size. - Systemic veins: The IVC was not visualized. Impressions: - LVEF 60-65%, mild concentric LVH, normal wall motion, diastolic dysfunction, normal LV filling pressure. Mild RV dilitation with normal function, upper normal RA size. No significant TR detected.  Consultations: none  Discharge Exam: Filed Vitals:   03/06/14 1122  BP: 122/72  Pulse:   Temp:   Resp:    Pulse 52-78. Respiratory rate 20. Blood pressure 122/72. Oxygen saturation 99% on room air.  General: pleasant, alert, 80 year old man sitting up in the chair, in no acute distress. Cardiovascular: S1, S2, with no murmurs rubs or gallops. Respiratory: clear to auscultation bilaterally. Neurologic: He is alert and oriented to himself, hospital, wife, daughter, but not to the year or month. He knows that he is in [place]. Cranial nerves II through XII are grossly intact. His gait is within normal limits.  Discharge Instructions You were cared for by a hospitalist during your hospital stay. If you have any questions about your discharge medications or the care you received while you were in the hospital after you are discharged, you can call the unit and asked to speak with the hospitalist on call if the hospitalist that took care of you is not available. Once you are discharged, your primary care physician will handle any further medical issues. Please note that NO REFILLS for any discharge medications will be authorized once you are discharged, as it is imperative that you return to your primary care physician (or establish a relationship with a primary care physician if you do not have one) for your aftercare needs so that they can reassess your need for  medications and monitor your lab values.  Discharge Instructions    Diet - low sodium heart healthy    Complete by:  As directed      Discharge instructions    Complete by:  As directed   Stop taking tramadol because he can cause dizziness and confusion. Take ibuprofen or Tylenol for headaches as directed on the label.     Increase activity slowly    Complete by:  As directed           Current Discharge Medication List    START taking these medications   Details  famotidine (PEPCID AC) 10 MG tablet Take 1 tablet (10 mg total) by mouth daily. Take this medication while you're on a higher dose of aspirin.    meclizine (ANTIVERT) 12.5 MG tablet Take 1 tablet (12.5 mg total) by mouth 3 (three) times daily as needed for dizziness. Qty: 30 tablet, Refills: 0      CONTINUE these medications which have CHANGED   Details  aspirin EC 81 MG tablet Take 2 tablets (162 mg total) by mouth daily.    ibuprofen (ADVIL,MOTRIN) 200 MG tablet Take 2 tablets (400 mg total) by mouth every 6 (six) hours as needed for headache.      CONTINUE these medications which have NOT CHANGED   Details  Ascorbic Acid (VITAMIN C) 500 MG tablet Take 500 mg by mouth daily.      Coenzyme Q10 (COQ10) 100 MG CAPS Take 1 capsule by mouth daily.     Cyanocobalamin (VITAMIN B-12 PO) Take 1 tablet by mouth daily.  Ferrous Fumarate (IRON) 18 MG TBCR Take 1 tablet by mouth daily.    Melatonin 5 MG TABS Take 5-10 mg by mouth daily as needed (sleep).     metoprolol (LOPRESSOR) 50 MG tablet Take 25 mg by mouth 2 (two) times daily.    Misc Natural Products (SUPER GREENS PO) Take 5 tablets by mouth at bedtime.    nitroGLYCERIN (NITROSTAT) 0.4 MG SL tablet Place 0.4 mg under the tongue every 5 (five) minutes as needed for chest pain.    Red Yeast Rice 600 MG CAPS Take 1 capsule by mouth 2 (two) times daily.      VITAMIN E BLEND PO Take 1,000 mg by mouth daily.     cholecalciferol (D-VI-SOL) 400 UNIT/ML LIQD Take  400 Units by mouth daily.      STOP taking these medications     traMADol (ULTRAM) 50 MG tablet        Allergies  Allergen Reactions  . Aricept [Donepezil Hcl] Other (See Comments)    Altered mental status  . Ativan [Lorazepam] Other (See Comments)    Altered mental status  . Atorvastatin Nausea And Vomiting and Other (See Comments)    Joint pain  . Simvastatin Nausea And Vomiting and Other (See Comments)    Joint pain  . Sorbitan Nausea And Vomiting    headache   Follow-up Information    Follow up with Glo Herring., MD. Schedule an appointment as soon as possible for a visit in 2 weeks.   Specialty:  Internal Medicine   Contact information:   [address] [place] [address] [phone]        The results of significant diagnostics from this hospitalization (including imaging, microbiology, ancillary and laboratory) are listed below for reference.    Significant Diagnostic Studies: Ct Head Wo Contrast  03/05/2014   CLINICAL DATA:  Dizziness for 3 hrs, slurred speech and headache  EXAM: CT HEAD WITHOUT CONTRAST  TECHNIQUE: Contiguous axial images were obtained from the base of the skull through the vertex without intravenous contrast.  COMPARISON:  08/19/2013  FINDINGS: Bony calvarium is intact. Paranasal sinuses mastoid air cells are within normal limits. Mild atrophic changes are seen similar to that noted on the prior exam. Mild chronic white matter ischemic change is seen. Old lacunar infarcts are noted in the region of the basal ganglia bilaterally and stable. No acute hemorrhage, acute infarction or space-occupying mass lesion are noted.  IMPRESSION: Chronic changes without acute abnormality.   Electronically Signed   By: Inez Catalina M.D.   On: 03/05/2014 12:49   Mr Brain Wo Contrast  03/05/2014   CLINICAL DATA:  80 year old male with dizziness and slurred speech. Transient ischemic attack. Initial encounter.  EXAM: MRI HEAD WITHOUT CONTRAST   TECHNIQUE: Multiplanar, multiecho pulse sequences of the brain and surrounding structures were obtained without intravenous contrast.  COMPARISON:  Head CT without contrast 1248 hr today and earlier.  FINDINGS: Cerebral volume is within normal limits for age. No restricted diffusion to suggest acute infarction. No midline shift, mass effect, evidence of mass lesion, ventriculomegaly, extra-axial collection or acute intracranial hemorrhage. Cervicomedullary junction and pituitary are within normal limits. Negative visualized cervical spine. Major intracranial vascular flow voids are within normal limits.  Small chronic lacunar infarct in the left PICA territory of the cerebellum. Mild for age T2 heterogeneity in the deep gray matter nuclei, with evidence of a small chronic lacunar infarct near the posterior limb of the right internal capsule (series 7, image  47). Otherwise mild for age nonspecific cerebral white matter T2 and FLAIR hyperintensity.  Grossly normal visualized internal auditory structures. Mastoids are clear. Mild paranasal sinus mucosal thickening. Postoperative changes to both globes. Visualized scalp soft tissues are within normal limits. Normal bone marrow signal.  IMPRESSION: 1.  No acute intracranial abnormality. 2. Chronic small and medium-sized vessel ischemia, extent mild to moderate for age.   Electronically Signed   By: Lars Pinks M.D.   On: 03/05/2014 15:12   US Carotid Bilateral  03/06/2014   CLINICAL DATA:  TIA symptoms, hypertension, syncope, coronary disease  EXAM: BILATERAL CAROTID DUPLEX ULTRASOUND  TECHNIQUE: Pearline Cables scale imaging, color Doppler and duplex ultrasound were performed of bilateral carotid and vertebral arteries in the neck.  COMPARISON:  None.  FINDINGS: Criteria: Quantification of carotid stenosis is based on velocity parameters that correlate the residual internal carotid diameter with NASCET-based stenosis levels, using the diameter of the distal internal carotid  lumen as the denominator for stenosis measurement.  The following velocity measurements were obtained:  RIGHT  ICA:  85/24 cm/sec  CCA:  74/12 cm/sec  SYSTOLIC ICA/CCA RATIO:  8.78  DIASTOLIC ICA/CCA RATIO:  6.76  ECA:  146 cm/sec  LEFT  ICA:  147/27 cm/sec  CCA:  72/0 cm/sec  SYSTOLIC ICA/CCA RATIO:  9.47  DIASTOLIC ICA/CCA RATIO:  0.96  ECA:  189 cm/sec  RIGHT CAROTID ARTERY: Mild Echogenic shadowing plaque formation. No hemodynamically significant right ICA stenosis, velocity elevation, or turbulent flow. Degree of narrowing less than 50%.  RIGHT VERTEBRAL ARTERY:  Antegrade  LEFT CAROTID ARTERY: Similar scattered mild to moderate echogenic plaque formation. No hemodynamically significant left ICA stenosis, velocity elevation, or turbulent flow.  LEFT VERTEBRAL ARTERY:  Antegrade  IMPRESSION: Right greater than left carotid atherosclerosis. No hemodynamically significant ICA stenosis by ultrasound. Degree of narrowing less than 50% bilaterally.   Electronically Signed   By: Daryll Brod M.D.   On: 03/06/2014 12:06   Dg Chest Port 1 View  03/05/2014   CLINICAL DATA:  Transient ischemic attack  EXAM: PORTABLE CHEST - 1 VIEW  COMPARISON:  Chest radiograph 04/26/2013 and CT chest of pelvis 08/19/2013  FINDINGS: Heart, mediastinal, and hilar contours are within normal limits and stable. Atherosclerotic calcification of the thoracic aortic arch. Normal pulmonary vascularity. The lungs are clear. No visible pleural effusion. Negative for pneumothorax. No acute osseous abnormality identified.  IMPRESSION: No active disease.   Electronically Signed   By: Curlene Dolphin M.D.   On: 03/05/2014 20:13    Microbiology: No results found for this or any previous visit (from the past 240 hour(s)).   Labs: Basic Metabolic Panel:  Recent Labs Lab 03/05/14 1147 03/06/14 0611  NA 140 142  K 4.8 4.7  CL 102 106  CO2 29 25  GLUCOSE 93 84  BUN 27* 24*  CREATININE 1.54* 1.48*  CALCIUM 9.3 9.0   Liver Function  Tests:  Recent Labs Lab 03/05/14 1147  AST 14  ALT 12  ALKPHOS 73  BILITOT 0.5  PROT 7.5  ALBUMIN 3.8   No results for input(s): LIPASE, AMYLASE in the last 168 hours. No results for input(s): AMMONIA in the last 168 hours. CBC:  Recent Labs Lab 03/05/14 1147  WBC 7.1  NEUTROABS 4.8  HGB 14.7  HCT 44.8  MCV 94.3  PLT 212   Cardiac Enzymes:  Recent Labs Lab 03/05/14 1147 03/05/14 1813  TROPONINI <0.30 <0.30   BNP: BNP (last 3 results)  Recent Labs  04/26/13  Leon 346.5   CBG: No results for input(s): GLUCAP in the last 168 hours.     Signed:  Rehema Muffley  [facility] 03/06/2014, 2:09 PM

## 2014-03-06 NOTE — Progress Notes (Signed)
UR completed 

## 2014-04-10 ENCOUNTER — Other Ambulatory Visit: Payer: Self-pay | Admitting: Cardiovascular Disease

## 2014-05-12 DIAGNOSIS — R441 Visual hallucinations: Secondary | ICD-10-CM | POA: Diagnosis not present

## 2014-07-11 ENCOUNTER — Emergency Department (HOSPITAL_COMMUNITY): Payer: Medicare Other

## 2014-07-11 ENCOUNTER — Inpatient Hospital Stay (HOSPITAL_COMMUNITY)
Admission: EM | Admit: 2014-07-11 | Discharge: 2014-07-12 | DRG: 303 | Disposition: A | Discharge status: Home / Self Care | Payer: Medicare Other | Attending: Cardiovascular Disease | Admitting: Cardiovascular Disease

## 2014-07-11 ENCOUNTER — Encounter (HOSPITAL_COMMUNITY): Payer: Self-pay | Admitting: *Deleted

## 2014-07-11 DIAGNOSIS — G309 Alzheimer's disease, unspecified: Secondary | ICD-10-CM | POA: Diagnosis not present

## 2014-07-11 DIAGNOSIS — I249 Acute ischemic heart disease, unspecified: Secondary | ICD-10-CM | POA: Diagnosis not present

## 2014-07-11 DIAGNOSIS — F0390 Unspecified dementia without behavioral disturbance: Secondary | ICD-10-CM

## 2014-07-11 DIAGNOSIS — R079 Chest pain, unspecified: Secondary | ICD-10-CM

## 2014-07-11 DIAGNOSIS — Z8673 Personal history of transient ischemic attack (TIA), and cerebral infarction without residual deficits: Secondary | ICD-10-CM | POA: Diagnosis not present

## 2014-07-11 DIAGNOSIS — I25110 Atherosclerotic heart disease of native coronary artery with unstable angina pectoris: Secondary | ICD-10-CM | POA: Diagnosis not present

## 2014-07-11 DIAGNOSIS — E669 Obesity, unspecified: Secondary | ICD-10-CM | POA: Diagnosis present

## 2014-07-11 DIAGNOSIS — F0280 Dementia in other diseases classified elsewhere without behavioral disturbance: Secondary | ICD-10-CM | POA: Diagnosis not present

## 2014-07-11 DIAGNOSIS — R072 Precordial pain: Secondary | ICD-10-CM | POA: Diagnosis not present

## 2014-07-11 DIAGNOSIS — Z955 Presence of coronary angioplasty implant and graft: Secondary | ICD-10-CM

## 2014-07-11 DIAGNOSIS — E785 Hyperlipidemia, unspecified: Secondary | ICD-10-CM | POA: Diagnosis not present

## 2014-07-11 DIAGNOSIS — Z79899 Other long term (current) drug therapy: Secondary | ICD-10-CM

## 2014-07-11 DIAGNOSIS — Z87891 Personal history of nicotine dependence: Secondary | ICD-10-CM

## 2014-07-11 DIAGNOSIS — I200 Unstable angina: Secondary | ICD-10-CM

## 2014-07-11 DIAGNOSIS — I129 Hypertensive chronic kidney disease with stage 1 through stage 4 chronic kidney disease, or unspecified chronic kidney disease: Secondary | ICD-10-CM | POA: Diagnosis present

## 2014-07-11 DIAGNOSIS — I2510 Atherosclerotic heart disease of native coronary artery without angina pectoris: Secondary | ICD-10-CM | POA: Diagnosis present

## 2014-07-11 DIAGNOSIS — I252 Old myocardial infarction: Secondary | ICD-10-CM | POA: Diagnosis not present

## 2014-07-11 DIAGNOSIS — Z6829 Body mass index (BMI) 29.0-29.9, adult: Secondary | ICD-10-CM

## 2014-07-11 DIAGNOSIS — Z7982 Long term (current) use of aspirin: Secondary | ICD-10-CM

## 2014-07-11 DIAGNOSIS — N183 Chronic kidney disease, stage 3 (moderate): Secondary | ICD-10-CM | POA: Diagnosis present

## 2014-07-11 HISTORY — DX: Atherosclerotic heart disease of native coronary artery without angina pectoris: I25.10

## 2014-07-11 LAB — I-STAT TROPONIN, ED: Troponin i, poc: 0 ng/mL (ref 0.00–0.08)

## 2014-07-11 LAB — BASIC METABOLIC PANEL
ANION GAP: 7 (ref 5–15)
BUN: 27 mg/dL — ABNORMAL HIGH (ref 6–23)
CO2: 27 mmol/L (ref 19–32)
CREATININE: 1.57 mg/dL — AB (ref 0.50–1.35)
Calcium: 9.3 mg/dL (ref 8.4–10.5)
Chloride: 105 mmol/L (ref 96–112)
GFR, EST AFRICAN AMERICAN: 45 mL/min — AB (ref >90)
GFR, EST NON AFRICAN AMERICAN: 39 mL/min — AB (ref >90)
GLUCOSE: 98 mg/dL (ref 70–99)
Potassium: 5.1 mmol/L (ref 3.5–5.1)
Sodium: 139 mmol/L (ref 135–145)

## 2014-07-11 LAB — COMPREHENSIVE METABOLIC PANEL
ALBUMIN: 3.7 g/dL (ref 3.5–5.2)
ALT: 12 U/L (ref 0–53)
AST: 17 U/L (ref 0–37)
Alkaline Phosphatase: 58 U/L (ref 39–117)
Anion gap: 3 — ABNORMAL LOW (ref 5–15)
BUN: 27 mg/dL — ABNORMAL HIGH (ref 6–23)
CO2: 34 mmol/L — ABNORMAL HIGH (ref 19–32)
Calcium: 9.4 mg/dL (ref 8.4–10.5)
Chloride: 104 mmol/L (ref 96–112)
Creatinine, Ser: 1.67 mg/dL — ABNORMAL HIGH (ref 0.50–1.35)
GFR calc Af Amer: 42 mL/min — ABNORMAL LOW (ref >90)
GFR, EST NON AFRICAN AMERICAN: 36 mL/min — AB (ref >90)
Glucose, Bld: 108 mg/dL — ABNORMAL HIGH (ref 70–99)
Potassium: 5.4 mmol/L — ABNORMAL HIGH (ref 3.5–5.1)
Sodium: 141 mmol/L (ref 135–145)
Total Bilirubin: 0.8 mg/dL (ref 0.3–1.2)
Total Protein: 6.8 g/dL (ref 6.0–8.3)

## 2014-07-11 LAB — BRAIN NATRIURETIC PEPTIDE: B Natriuretic Peptide: 82.1 pg/mL (ref 0.0–100.0)

## 2014-07-11 LAB — CBC WITH DIFFERENTIAL/PLATELET
BASOS ABS: 0 10*3/uL (ref 0.0–0.1)
Basophils Relative: 1 % (ref 0–1)
EOS PCT: 5 % (ref 0–5)
Eosinophils Absolute: 0.4 10*3/uL (ref 0.0–0.7)
HCT: 43.4 % (ref 39.0–52.0)
HEMOGLOBIN: 14 g/dL (ref 13.0–17.0)
LYMPHS PCT: 25 % (ref 12–46)
Lymphs Abs: 2 10*3/uL (ref 0.7–4.0)
MCH: 30.8 pg (ref 26.0–34.0)
MCHC: 32.3 g/dL (ref 30.0–36.0)
MCV: 95.4 fL (ref 78.0–100.0)
MONO ABS: 0.8 10*3/uL (ref 0.1–1.0)
MONOS PCT: 9 % (ref 3–12)
NEUTROS ABS: 4.9 10*3/uL (ref 1.7–7.7)
NEUTROS PCT: 60 % (ref 43–77)
PLATELETS: 195 10*3/uL (ref 150–400)
RBC: 4.55 MIL/uL (ref 4.22–5.81)
RDW: 12.9 % (ref 11.5–15.5)
WBC: 8.2 10*3/uL (ref 4.0–10.5)

## 2014-07-11 LAB — TROPONIN I

## 2014-07-11 MED ORDER — NITROGLYCERIN 0.4 MG SL SUBL: 0.4000 mg | SUBLINGUAL_TABLET | SUBLINGUAL | Status: DC | PRN

## 2014-07-11 MED ORDER — ASPIRIN EC 81 MG PO TBEC
81.0000 mg | DELAYED_RELEASE_TABLET | Freq: Every day | ORAL | Status: DC
  Administered 2014-07-12: 81 mg via ORAL
  Filled 2014-07-11: qty 1

## 2014-07-11 MED ORDER — FAMOTIDINE 10 MG PO TABS
10.0000 mg | ORAL_TABLET | Freq: Every day | ORAL | Status: DC
  Administered 2014-07-11 – 2014-07-12 (×2): 10 mg via ORAL
  Filled 2014-07-11 (×2): qty 1

## 2014-07-11 MED ORDER — HEPARIN (PORCINE) IN NACL 100-0.45 UNIT/ML-% IJ SOLN
1150.0000 [IU]/h | INTRAMUSCULAR | Status: DC
  Administered 2014-07-11: 1300 [IU]/h via INTRAVENOUS
  Filled 2014-07-11 (×2): qty 250

## 2014-07-11 MED ORDER — ONDANSETRON HCL 4 MG/2ML IJ SOLN
4.0000 mg | Freq: Four times a day (QID) | INTRAMUSCULAR | Status: DC | PRN
Start: 2014-07-11 — End: 2014-07-12

## 2014-07-11 MED ORDER — ACETAMINOPHEN 325 MG PO TABS: 650.0000 mg | ORAL_TABLET | ORAL | Status: DC | PRN

## 2014-07-11 MED ORDER — MECLIZINE HCL 12.5 MG PO TABS
12.5000 mg | ORAL_TABLET | Freq: Three times a day (TID) | ORAL | Status: DC | PRN
  Filled 2014-07-11: qty 1

## 2014-07-11 MED ORDER — METOPROLOL TARTRATE 25 MG PO TABS
25.0000 mg | ORAL_TABLET | Freq: Two times a day (BID) | ORAL | Status: DC
  Administered 2014-07-11 – 2014-07-12 (×2): 25 mg via ORAL
  Filled 2014-07-11 (×3): qty 1

## 2014-07-11 MED ORDER — HEPARIN BOLUS VIA INFUSION
4000.0000 [IU] | Freq: Once | INTRAVENOUS | Status: AC
  Administered 2014-07-11: 4000 [IU] via INTRAVENOUS
  Filled 2014-07-11: qty 4000

## 2014-07-11 NOTE — H&P (Signed)
History and Physical   Admit date: 07/11/2014 Name:  David Collier Medical record number: [id number] DOB/Age:  79-Apr-1930  81 y.o. male  Referring Physician:   [facility]  Primary Cardiologist:  Dr. [name]  Chief complaint/reason for admission: Chest pain  HPI:  This 81 year old male has a history of coronary artery disease diagnosed in 2003 and had bare metal stenting in 2006 and had instant restenosis in 2010.  He has not had catheterization since then.  According to the wife he has been diagnosed with dementia and has failed a couple of medicines used for this.  The wife does he will occasionally have chest pain that he will take ibuprofen for and will intermittently take nitroglycerin but none in quite a while.  He was at church this evening and after taking communion had the onset of midsternal chest pain that the family noted and EMS was called.  He was transported here as a code STEMI but did not have ST elevation on his EKG.  He was given several nitroglycerin and was pain-free on arrival here but was stating that he had a mild ache in his chest starting to happen again.  He describes the chest pain as an ache or midsternal pain.  He is a somewhat poor historian and does note that he has prior dementia.  He also has a history of stage III chronic kidney disease was hospitalized with a TIA in November of this year.  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  No congestive heart failure, no PND or orthopnea.  No definite claudication.    Past Medical History  Diagnosis Date  . Hyperlipidemia   . Myocardial infarction   . Coronary atherosclerosis of native coronary artery     BMS circumflex and OM 2006, PTCA ISR 2010, moderate LAD disease  . DJD (degenerative joint disease)   . Macular degeneration   . Alzheimer's dementia   . CKD (chronic kidney disease), stage III   . History of stroke     Seen radiographically-left PICA; unknown to patient.  Marland Kitchen TIA (transient ischemic  attack) 03/05/2014  . 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 CAD (coronary artery disease), native coronary artery     Initially diagnosed 2003 2006 catheterization with bare metal stenting of the first marginal branch of the circumflex and continuation branch of the circumflex 2010 catheterization showing normal left main, 50-60% LAD unchanged from before, proximal stent of marginal patent, severe in-stent restenosis treated with cutting balloon angioplasty, patent stent in continuation branch with mild in-stent restenosis, occluded nondominant right coronary artery.      Past Surgical History  Procedure Laterality Date  . Appendectomy     Allergies: is allergic to aricept; ativan; atorvastatin; simvastatin; and sorbitan.   Medications: Prior to Admission medications   Medication Sig Start Date End Date Taking? Authorizing Provider  Ascorbic Acid (VITAMIN C) 500 MG tablet Take 500 mg by mouth daily.      Historical Provider, MD  aspirin EC 81 MG tablet Take 2 tablets (162 mg total) by mouth daily. 03/06/14   Rexene Alberts, MD  cholecalciferol (D-VI-SOL) 400 UNIT/ML LIQD Take 400 Units by mouth daily.    Historical Provider, MD  Coenzyme Q10 (COQ10) 100 MG CAPS Take 1 capsule by mouth daily.     Historical Provider, MD  Cyanocobalamin (VITAMIN B-12 PO) Take 1 tablet by mouth daily.    Historical Provider, MD  famotidine (PEPCID AC) 10 MG tablet Take 1 tablet (  10 mg total) by mouth daily. Take this medication while you're on a higher dose of aspirin. 03/06/14   Rexene Alberts, MD  Ferrous Fumarate (IRON) 18 MG TBCR Take 1 tablet by mouth daily.    Historical Provider, MD  ibuprofen (ADVIL,MOTRIN) 200 MG tablet Take 2 tablets (400 mg total) by mouth every 6 (six) hours as needed for headache. 03/06/14   Rexene Alberts, MD  meclizine (ANTIVERT) 12.5 MG tablet Take 1 tablet (12.5 mg total) by mouth 3 (three) times daily as needed for dizziness. 03/06/14   Rexene Alberts, MD  Melatonin  5 MG TABS Take 5-10 mg by mouth daily as needed (sleep).     Historical Provider, MD  metoprolol (LOPRESSOR) 50 MG tablet Take 25 mg by mouth 2 (two) times daily.    Historical Provider, MD  Misc Natural Products (SUPER GREENS PO) Take 5 tablets by mouth at bedtime.    Historical Provider, MD  nitroGLYCERIN (NITROSTAT) 0.4 MG SL tablet Place 0.4 mg under the tongue every 5 (five) minutes as needed for chest pain.    Historical Provider, MD  NITROSTAT 0.4 MG SL tablet DISSOLVE ONE TABLET UNDER THE TONGUE EVERY 5 MINUTES AS NEEDED FOR CHEST PAIN 04/12/14   [name], MD  Red Yeast Rice 600 MG CAPS Take 1 capsule by mouth 2 (two) times daily.      Historical Provider, MD  VITAMIN E BLEND PO Take 1,000 mg by mouth daily.     Historical Provider, MD   Family History:  Family Status  Relation Status Death Age  . Father Deceased 62    Heart failure and MI  . Mother Deceased 63    Congestive heart failure  . Brother Deceased 10 months    Meningitis  . Brother Alive     CAD  . Brother Deceased     CAD, cancer  . Brother Deceased     Cancer  . Sister Alive     Hypertension  . Sister Deceased     Myocardial infarction  . Sister Alive    Social History:   reports that he quit smoking about 48 years ago. His smoking use included Cigarettes. He has never used smokeless tobacco. He reports that he does not drink alcohol or use illicit drugs.   History   Social History Narrative   Retired Naval architect, lives with wife     Review of Systems: He has dementia so review of systems is difficult.  No definite GI problems, some urinary frequency but no incontinence.  Moderate arthritis of his back and knees.  He complains of intermittent dizziness as well as headaches. Other than as noted above, the remainder of the review of systems is normal  Physical Exam: BP 183/86 mmHg  Pulse 72  Temp(Src) 97.5 F (36.4 C) (Oral)  Resp 17  Wt 95.255 kg (210 lb)  SpO2 98% General appearance:  He is an elderly pleasant male who is able to give some history but says he has dementia and occasionally will become confused and repeat himself is not currently having chest pain Head: Normocephalic, without obvious abnormality, atraumatic Eyes: conjunctivae/corneas clear. PERRL, EOM's intact. Fundi not examined  Neck: no adenopathy, no carotid bruit, no JVD and supple, symmetrical, trachea midline Lungs: clear to auscultation bilaterally Heart: regular rate and rhythm, S1, S2 normal, no murmur, click, rub or gallop Abdomen: soft, non-tender; bowel sounds normal; no masses,  no organomegaly Rectal: deferred Extremities: extremities normal, atraumatic, no cyanosis or edema Pulses:  2+ and symmetric Skin: Skin color, texture, turgor normal. No rashes or lesions Neurologic: Grossly normal   Labs: Unavailable at time of dictation  EKG: Sinus rhythm with right bundle branch block, no acute ST elevation  Radiology: No acute cardiopulmonary disease  IMPRESSIONS: 1.  Prolonged chest discomfort in a patient with known coronary artery disease with no acute ST elevation noted on EKG consistent with unstable angina or acute coronary syndrome 2.  Coronary artery disease with previous stenting of the marginal system with previous in-stent restenosis 2010 3.  Dementia with intolerance to several agents 4.  Hypertension 5.  Hyperlipidemia with statin intolerance 6.  History of TIA 7.  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  Obesity 9.  Stage III chronic kidney disease  PLAN: Patient is a somewhat poor historian but is not currently having chest pain.  Patient will be placed on telemetry unit.  Begin intravenous nitroglycerin and intravenous heparin.  Further history to be obtained from family.  Check serial enzymes.  He has previously had a myocardial perfusion scan in the past year or so and may need to have a catheterization although with his dementia will let the team in the morning  decide exactly how  to proceed with the workup.   Signed: Kerry Hough MD [facility]  07/11/2014, 7:50 PM

## 2014-07-11 NOTE — Consult Note (Signed)
ANTICOAGULATION CONSULT NOTE - Initial Consult  Pharmacy Consult for Heparin Indication: chest pain/ACS  Allergies  Allergen Reactions  . Aricept [Donepezil Hcl] Other (See Comments)    Altered mental status  . Ativan [Lorazepam] Other (See Comments)    Altered mental status  . Atorvastatin Nausea And Vomiting and Other (See Comments)    Joint pain  . Namenda [Memantine] Other (See Comments)    Altered mental status  . Simvastatin Nausea And Vomiting and Other (See Comments)    Joint pain  . Sorbitan Nausea And Vomiting    headache    Patient Measurements: Weight: 210 lb (95.255 kg) Heparin Dosing Weight: ~91kg  Vital Signs: Temp: 97.5 F (36.4 C) (03/20 1911) Temp Source: Oral (03/20 1911) BP: 136/70 mmHg (03/20 2015) Pulse Rate: 72 (03/20 1911)  Labs: No results for input(s): HGB, HCT, PLT, APTT, LABPROT, INR, HEPARINUNFRC, CREATININE, CKTOTAL, CKMB, TROPONINI in the last 72 hours.  CrCl cannot be calculated (Patient has no serum creatinine result on file.).   Medical History: Past Medical History  Diagnosis Date  . Hyperlipidemia   . Myocardial infarction   . Coronary atherosclerosis of native coronary artery     BMS circumflex and OM 2006, PTCA ISR 2010, moderate LAD disease  . DJD (degenerative joint disease)   . Macular degeneration   . Alzheimer's dementia   . CKD (chronic kidney disease), stage III   . History of stroke     Seen radiographically-left PICA; unknown to patient.  Marland Kitchen TIA (transient ischemic attack) 03/05/2014  . 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 CAD (coronary artery disease), native coronary artery     Initially diagnosed 2003 2006 catheterization with bare metal stenting of the first marginal branch of the circumflex and continuation branch of the circumflex 2010 catheterization showing normal left main, 50-60% LAD unchanged from before, proximal stent of marginal patent, severe in-stent restenosis treated with cutting  balloon angioplasty, patent stent in continuation branch with mild in-stent restenosis, occluded nondominant right coronary artery.     Medications:  NO anticoagulants pta  Assessment: 85yom with hx CAD s/p BMS with in-stent restenosis in 2010 presents to the ED with chest pain. Troponins negative thus far. He will begin IV heparin and may need a cath this admission.  Goal of Therapy:  Heparin level 0.3-0.7 units/ml Monitor platelets by anticoagulation protocol: Yes   Plan:  1) Heparin bolus 4000 units x 1 2) Heparin drip at 1300 units/hr 3) Check 8 hour heparin level 4) Daily heparin level and CBC  Deboraha Sprang 07/11/2014,8:30 PM

## 2014-07-11 NOTE — ED Notes (Signed)
Pt arrives via EMS from home c/o chest pain. Pt received 324 ASA and 3 NTG prior to EMS arrival. Pt went from 10/10 CP to 3/10. Dr Darl Householder and Dr Wynonia Lawman and bedside upon arrival.

## 2014-07-11 NOTE — ED Notes (Addendum)
Attemped report 

## 2014-07-11 NOTE — ED Provider Notes (Signed)
CSN: [id number]     Arrival date & time 07/11/14  1902 History   First MD Initiated Contact with Patient 07/11/14 1905     Chief Complaint  Patient presents with  . Chest Pain     (Consider location/radiation/quality/duration/timing/severity/associated sxs/prior Treatment) Patient is a 81 y.o. male presenting with chest pain. The history is provided by the patient and the EMS personnel. The history is limited by the condition of the patient (Advanced dementia).  Chest Pain Pain location:  Substernal area Pain quality: pressure   Pain radiates to:  Does not radiate Pain severity:  Severe Onset quality:  Gradual Duration:  1 week Timing:  Intermittent Progression:  Waxing and waning Chronicity:  Recurrent Relieved by:  Nitroglycerin Worsened by:  Nothing tried Ineffective treatments:  None tried Associated symptoms: diaphoresis, nausea and shortness of breath   Risk factors: coronary artery disease     Past Medical History  Diagnosis Date  . Hyperlipidemia   . Myocardial infarction   . Coronary atherosclerosis of native coronary artery     BMS circumflex and OM 2006, PTCA ISR 2010, moderate LAD disease  . DJD (degenerative joint disease)   . Macular degeneration   . Alzheimer's dementia   . CKD (chronic kidney disease), stage III   . History of stroke     Seen radiographically-left PICA; unknown to patient.  Marland Kitchen TIA (transient ischemic attack) 03/05/2014  . 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 CAD (coronary artery disease), native coronary artery     Initially diagnosed 2003 2006 catheterization with bare metal stenting of the first marginal branch of the circumflex and continuation branch of the circumflex 2010 catheterization showing normal left main, 50-60% LAD unchanged from before, proximal stent of marginal patent, severe in-stent restenosis treated with cutting balloon angioplasty, patent stent in continuation branch with mild in-stent restenosis,  occluded nondominant right coronary artery.    Past Surgical History  Procedure Laterality Date  . Appendectomy     Family History  Problem Relation Age of Onset  . Heart failure     History  Substance Use Topics  . Smoking status: Former Smoker    Types: Cigarettes    Quit date: 04/23/1966  . Smokeless tobacco: Never Used  . Alcohol Use: No    Review of Systems  Constitutional: Positive for diaphoresis.  Respiratory: Positive for shortness of breath.   Cardiovascular: Positive for chest pain.  Gastrointestinal: Positive for nausea.  All other systems reviewed and are negative.     Allergies  Aricept; Ativan; Atorvastatin; Namenda; Simvastatin; and Sorbitan  Home Medications   Prior to Admission medications   Medication Sig Start Date End Date Taking? Authorizing Provider  acetaminophen (TYLENOL) 325 MG tablet Take 650 mg by mouth every 6 (six) hours as needed for headache.   Yes Historical Provider, MD  Ascorbic Acid (VITAMIN C) 500 MG tablet Take 500 mg by mouth daily.     Yes Historical Provider, MD  aspirin EC 81 MG tablet Take 2 tablets (162 mg total) by mouth daily. 03/06/14  Yes Rexene Alberts, MD  cholecalciferol (D-VI-SOL) 400 UNIT/ML LIQD Take 400 Units by mouth at bedtime.    Yes Historical Provider, MD  Coenzyme Q10 (COQ10) 100 MG CAPS Take 1 capsule by mouth daily.    Yes Historical Provider, MD  Cyanocobalamin (VITAMIN B-12 PO) Take 1 tablet by mouth daily.   Yes Historical Provider, MD  ibuprofen (ADVIL,MOTRIN) 200 MG tablet Take 2 tablets (400 mg total) by mouth every 6 (  six) hours as needed for headache. 03/06/14  Yes Rexene Alberts, MD  Melatonin 5 MG TABS Take 5-10 mg by mouth daily as needed (sleep).    Yes Historical Provider, MD  Misc Natural Products (SUPER GREENS PO) Take 2 tablets by mouth 2 (two) times daily. 2 tablets in the morning and 3 at night.   Yes Historical Provider, MD  NITROSTAT 0.4 MG SL tablet DISSOLVE ONE TABLET UNDER THE TONGUE EVERY  5 MINUTES AS NEEDED FOR CHEST PAIN 04/12/14  Yes [name], MD  Red Yeast Rice 600 MG CAPS Take 1 capsule by mouth 2 (two) times daily.     Yes Historical Provider, MD  Ferrous Fumarate (IRON) 18 MG TBCR Take 1 tablet by mouth daily.    Historical Provider, MD  meclizine (ANTIVERT) 12.5 MG tablet Take 1 tablet (12.5 mg total) by mouth 3 (three) times daily as needed for dizziness. 03/06/14   Rexene Alberts, MD  metoprolol (LOPRESSOR) 50 MG tablet Take 50 mg by mouth daily.     Historical Provider, MD   BP 133/88 mmHg  Pulse 56  Temp(Src) 97.9 F (36.6 C) (Oral)  Resp 18  Ht 5\' 10"  (1.778 m)  Wt 206 lb 12.7 oz (93.8 kg)  BMI 29.67 kg/m2  SpO2 100% Physical Exam  Constitutional: He appears well-developed and well-nourished. No distress.  Alert, oriented to person, cooperative, in no distress  HENT:  Head: Normocephalic and atraumatic.  Mouth/Throat: Oropharynx is clear and moist. No oropharyngeal exudate.  Eyes: Conjunctivae and EOM are normal. Pupils are equal, round, and reactive to light.  Neck: Normal range of motion. Neck supple.  Cardiovascular: Normal rate, regular rhythm, normal heart sounds and intact distal pulses.  Exam reveals no gallop and no friction rub.   No murmur heard. Pulmonary/Chest: Effort normal and breath sounds normal. No respiratory distress. He has no wheezes. He has no rales.  Abdominal: Soft. He exhibits no distension and no mass. There is no tenderness. There is no rebound and no guarding.  Musculoskeletal: Normal range of motion. He exhibits no edema or tenderness.  Lymphadenopathy:    He has no cervical adenopathy.  Neurological: He is alert. No cranial nerve deficit.  Oriented only to person  Skin: Skin is warm and dry. No rash noted. He is not diaphoretic.  Psychiatric: He has a normal mood and affect. His behavior is normal. Judgment and thought content normal.  Nursing note and vitals reviewed.   ED Course  Procedures (including critical  care time) Labs Review Labs Reviewed  BASIC METABOLIC PANEL - Abnormal; Notable for the following:    BUN 27 (*)    Creatinine, Ser 1.57 (*)    GFR calc non Af Amer 39 (*)    GFR calc Af Amer 45 (*)    All other components within normal limits  CBC WITH DIFFERENTIAL/PLATELET  BRAIN NATRIURETIC PEPTIDE  HEPARIN LEVEL (UNFRACTIONATED)  CBC  COMPREHENSIVE METABOLIC PANEL  TSH  TROPONIN I  TROPONIN I  TROPONIN I  LIPID PANEL  I-STAT TROPOININ, ED    Imaging Review Dg Chest Portable 1 View  07/11/2014   CLINICAL DATA:  Acute onset of generalized chest pain. Initial encounter.  EXAM: PORTABLE CHEST - 1 VIEW  COMPARISON:  Chest radiograph performed 03/05/2014  FINDINGS: The lungs are well-aerated and clear. There is no evidence of focal opacification, pleural effusion or pneumothorax.  The cardiomediastinal silhouette is borderline normal in size. No acute osseous abnormalities are seen. Chronic left-sided rib deformities are  noted.  IMPRESSION: No acute cardiopulmonary process seen.   Electronically Signed   By: Garald Balding M.D.   On: 07/11/2014 19:42     EKG Interpretation   Date/Time:  Sunday July 11 2014 19:06:14 EDT Ventricular Rate:  68 PR Interval:  235 QRS Duration: 133 QT Interval:  400 QTC Calculation: 425 R Axis:   33 Text Interpretation:  Sinus rhythm Atrial premature complexes Prolonged PR  interval Right bundle branch block Minimal ST elevation, inferior leads No  significant change since last tracing Confirmed by YAO  MD, DAVID ([id number])  on 07/11/2014 7:43:28 PM      MDM   Final diagnoses:  Chest pain with high risk of acute coronary syndrome  Dementia, without behavioral disturbance  Unstable angina pectoris   81 year old male with a history of coronary disease presents with chest pain. Chest pain started earlier today while at church and was associated with diaphoresis and shortness of breath. He has a history of coronary disease and last intervention  was in 2010. He also has advancing dementia and history is difficult to rely on. On EMS arrival he was diaphoretic and given aspirin as well as 3 sublingual nitros with resolution of chest pain. EKG shows no ST changes. Cardiology was present at the bedside on arrival. Concern for cardiac etiology of chest pain and we'll obtain chest x-ray as well as basic labs and troponin.  Troponin negative. Laboratory workup reassuring. Cardiology to admit.  Larence Penning, MD 07/11/14 2141  Wandra Arthurs, MD 07/11/14 [phone]

## 2014-07-11 NOTE — ED Notes (Signed)
Spoke with phlebotomy stated they will come get the blood work.

## 2014-07-11 NOTE — Progress Notes (Signed)
PATIENT ARRIVED TO TELE UNIT FROM E.D. VIA STRETCHER. ASSISTED TO BED. TELE APPLIED. VITALS OBTAINED. ASSESSMENT PERFORMED.  PATIENT EDUCATED ON BEDSIDE EQUIPMENT AND FALL RISK SAFETY.

## 2014-07-11 NOTE — ED Notes (Signed)
Patient family  Took with them patient's shirt, water bottle and cell phone.

## 2014-07-12 DIAGNOSIS — I200 Unstable angina: Secondary | ICD-10-CM

## 2014-07-12 LAB — CBC
HCT: 42.4 % (ref 39.0–52.0)
Hemoglobin: 13.6 g/dL (ref 13.0–17.0)
MCH: 30.6 pg (ref 26.0–34.0)
MCHC: 32.1 g/dL (ref 30.0–36.0)
MCV: 95.3 fL (ref 78.0–100.0)
PLATELETS: 200 10*3/uL (ref 150–400)
RBC: 4.45 MIL/uL (ref 4.22–5.81)
RDW: 12.9 % (ref 11.5–15.5)
WBC: 8 10*3/uL (ref 4.0–10.5)

## 2014-07-12 LAB — LIPID PANEL
CHOLESTEROL: 177 mg/dL (ref 0–200)
HDL: 33 mg/dL — ABNORMAL LOW (ref >39)
LDL Cholesterol: 122 mg/dL — ABNORMAL HIGH (ref 0–99)
TRIGLYCERIDES: 110 mg/dL (ref <150)
Total CHOL/HDL Ratio: 5.4 RATIO
VLDL: 22 mg/dL (ref 0–40)

## 2014-07-12 LAB — TROPONIN I
Troponin I: <0.03 ng/mL (ref <0.031)
Troponin I: <0.03 ng/mL (ref <0.031)

## 2014-07-12 LAB — HEPARIN LEVEL (UNFRACTIONATED): Heparin Unfractionated: 0.73 IU/mL — ABNORMAL HIGH (ref 0.30–0.70)

## 2014-07-12 LAB — TSH: TSH: 2.145 u[IU]/mL (ref 0.350–4.500)

## 2014-07-12 MED ORDER — ISOSORBIDE MONONITRATE ER 30 MG PO TB24: 15.0000 mg | ORAL_TABLET | Freq: Every day | ORAL | Status: DC

## 2014-07-12 MED ORDER — METOPROLOL TARTRATE 25 MG PO TABS: 25.0000 mg | ORAL_TABLET | Freq: Every day | ORAL | Status: DC

## 2014-07-12 MED ORDER — ISOSORBIDE MONONITRATE 15 MG HALF TABLET
15.0000 mg | ORAL_TABLET | Freq: Every day | ORAL | Status: DC
  Filled 2014-07-12: qty 1

## 2014-07-12 NOTE — Progress Notes (Signed)
PATIENT HAS REMAINED AWAKE OVERNIGHT. NO C/O PAIN THIS SHIFT.  WIFE AT BEDSIDE. AWAITING PATIENT'S DISCHARGE IF CLEARED.  INSTRUCTED TO CALL FOR ASSISTANCE WHEN NEEDED.

## 2014-07-12 NOTE — Progress Notes (Signed)
Discharge instruction give. Pt verbalized understanding and all questions were answered.

## 2014-07-12 NOTE — Progress Notes (Signed)
ANTICOAGULATION CONSULT NOTE - Follow Up Consult  Pharmacy Consult for Heparin  Indication: chest pain/ACS  Allergies  Allergen Reactions  . Aricept [Donepezil Hcl] Other (See Comments)    Altered mental status  . Ativan [Lorazepam] Other (See Comments)    Altered mental status  . Atorvastatin Nausea And Vomiting and Other (See Comments)    Joint pain  . Namenda [Memantine] Other (See Comments)    Altered mental status  . Simvastatin Nausea And Vomiting and Other (See Comments)    Joint pain  . Sorbitan Nausea And Vomiting    headache   Patient Measurements: Height: 5\' 10"  (177.8 cm) Weight: 206 lb 12.7 oz (93.8 kg) IBW/kg (Calculated) : 73  Vital Signs: Temp: 97.9 F (36.6 C) (03/21 0404) Temp Source: Oral (03/21 0404) BP: 123/77 mmHg (03/21 0404) Pulse Rate: 54 (03/21 0404)  Labs:  Recent Labs  07/11/14 1959 07/11/14 2205 07/12/14 0300  HGB 14.0  --  13.6  HCT 43.4  --  42.4  PLT 195  --  200  HEPARINUNFRC  --   --  0.73*  CREATININE 1.57* 1.67*  --   TROPONINI  --  <0.03 <0.03    Estimated Creatinine Clearance: 38.5 mL/min (by C-G formula based on Cr of 1.67).  Assessment: Supra-therapeutic heparin level, no issues per RN.   Goal of Therapy:  Heparin level 0.3-0.7 units/ml Monitor platelets by anticoagulation protocol: Yes   Plan:  -Reduce heparin drip to 1150 units/hr -1300 HL -Daily CBC/HL -Monitor for bleeding  Narda Bonds 07/12/2014,4:50 AM

## 2014-07-12 NOTE — Progress Notes (Signed)
Patient Name: David Collier Date of Encounter: 07/12/2014  Active Problems:   CAD (coronary artery disease), native coronary artery   Dementia   Unstable angina pectoris   Personal history of transient ischemic attack (TIA) and cerebral infarction without residual deficit   Primary Cardiologist: Dr. Burt Knack  Patient Profile: 81 yo male w/ hx BMS 2003, ISR 2010, admitted 03/20 w/ chest pain, ez neg.  SUBJECTIVE: No chest pain or SOB overnight.  OBJECTIVE Filed Vitals:   07/11/14 2100 07/11/14 2104 07/11/14 2223 07/12/14 0404  BP:    123/77  Pulse:  56 66 54  Temp:  97.9 F (36.6 C)  97.9 F (36.6 C)  TempSrc:  Oral  Oral  Resp:  18  18  Height: 5\' 10"  (1.778 m)     Weight:  206 lb 12.7 oz (93.8 kg)    SpO2:  100%  98%   No intake or output data in the 24 hours ending 07/12/14 0816 Filed Weights   07/11/14 1911 07/11/14 2104  Weight: 210 lb (95.255 kg) 206 lb 12.7 oz (93.8 kg)    PHYSICAL EXAM General: Well developed, well nourished, male in no acute distress. Head: Normocephalic, atraumatic.  Neck: Supple without bruits, JVD not elevated. Lungs:  Resp regular and unlabored, CTA. Heart: RRR, S1, S2, no S3, S4, soft murmur; no rub. Abdomen: Soft, non-tender, non-distended, BS + x 4.  Extremities: No clubbing, cyanosis, no edema.  Neuro: Alert and oriented X 2. Moves all extremities spontaneously. Psych: Normal affect.  LABS: CBC: Recent Labs  07/11/14 1959 07/12/14 0300  WBC 8.2 8.0  NEUTROABS 4.9  --   HGB 14.0 13.6  HCT 43.4 42.4  MCV 95.4 95.3  PLT 195 073   Basic Metabolic Panel: Recent Labs  07/11/14 1959 07/11/14 2205  NA 139 141  K 5.1 5.4*  CL 105 104  CO2 27 34*  GLUCOSE 98 108*  BUN 27* 27*  CREATININE 1.57* 1.67*  CALCIUM 9.3 9.4   Liver Function Tests: Recent Labs  07/11/14 2205  AST 17  ALT 12  ALKPHOS 58  BILITOT 0.8  PROT 6.8  ALBUMIN 3.7   Cardiac Enzymes: Recent Labs  07/11/14 2205 07/12/14 0300    TROPONINI <0.03 <0.03    Recent Labs  07/11/14 2001  TROPIPOC 0.00   BNP:  B NATRIURETIC PEPTIDE  Date/Time Value Ref Range Status  07/11/2014 07:58 PM 82.1 0.0 - 100.0 pg/mL Final   Fasting Lipid Panel: Recent Labs  07/12/14 0300  CHOL 177  HDL 33*  LDLCALC 122*  TRIG 110  CHOLHDL 5.4   Thyroid Function Tests: Recent Labs  07/11/14 2205  TSH 2.145   TELE:  SR, sinus brady, high 40s while asleep     ECG: SR, RBBB is old  Radiology/Studies: Dg Chest Portable 1 View 07/11/2014   CLINICAL DATA:  Acute onset of generalized chest pain. Initial encounter.  EXAM: PORTABLE CHEST - 1 VIEW  COMPARISON:  Chest radiograph performed 03/05/2014  FINDINGS: The lungs are well-aerated and clear. There is no evidence of focal opacification, pleural effusion or pneumothorax.  The cardiomediastinal silhouette is borderline normal in size. No acute osseous abnormalities are seen. Chronic left-sided rib deformities are noted.  IMPRESSION: No acute cardiopulmonary process seen.   Electronically Signed   By: Garald Balding M.D.   On: 07/11/2014 19:42   Current Medications:  . aspirin EC  81 mg Oral Daily  . famotidine  10 mg Oral Daily  .  metoprolol  25 mg Oral BID   . heparin 1,150 Units/hr (07/12/14 0453)    ASSESSMENT AND PLAN: Active Problems:   CAD (coronary artery disease), native coronary artery - see below    Dementia - wife has POA    Unstable angina pectoris - no sx overnight - ez neg MI - discussed options with wife, she is emphatic that she does not wish any further workup - on ASA, BB - MD advise on adding Imdur    Personal history of transient ischemic attack (TIA) and cerebral infarction without residual deficit - follow    Hyperlipidemia - LDL is elevated - did not tolerate atorva, simva, MD advise on trying another one  Plan - possible d/c today  Signed, Rosaria Ferries , PA-C 8:16 AM 07/12/2014  Patient seen, examined. Available data reviewed. Agree  with findings, assessment, and plan as outlined by Rosaria Ferries, PA-C. Pt well-known to me. Exam reveals pleasant but confused male in NAD. Lungs CTA, heart RRR without murmur, no peripheral edema. EKG without acute changes and troponins negative x 3. He is absolutely NOT a candidate for invasive cardiac evaluation with progressive dementia. Will d/c home today with Imdur 15 mg daily added to his medical regimen. Will arrange outpatient follow-up. Plan reviewed at length with patient's wife who is his caregiver. All questions answered.  [name], M.D. 07/12/2014 10:39 AM

## 2014-07-12 NOTE — Discharge Summary (Signed)
CARDIOLOGY DISCHARGE SUMMARY   Patient ID: David Collier MRN: [id number] DOB/AGE: December 20, 1928 81 y.o.  Admit date: 07/11/2014 Discharge date: 07/12/2014  PCP: Glo Herring., MD Primary Cardiologist: Dr. Burt Knack  Primary Discharge Diagnosis:  Unstable angina, medical therapy for CAD Secondary Discharge Diagnosis:    CAD (coronary artery disease), native coronary artery   Dementia   Personal history of transient ischemic attack (TIA) and cerebral infarction without residual deficit  Procedures: Chest x-ray  Hospital Course: David Collier is a 81 y.o. male with a history of CAD, last intervention in 2010, dementia and TIA. He began complaining of chest pain and EMS was called. He was treated with aspirin and sublingual nitroglycerin which improved his pain. Of the time he arrived at the hospital, he was pain-free. He was admitted for further evaluation and treatment.  His ECG was without acute changes, the right bundle branch block is old. His chest x-ray showed chronic left-sided rib deformities but no acute disease. His cardiac enzymes were negative for MI. He was monitored closely overnight. His cardiac enzymes remained negative for MI. He had sinus bradycardia on telemetry, with rates in the high 40s but was asymptomatic with this.  On 07/12/2014, he was seen by Dr. Burt Knack and all data were reviewed. His symptoms had resolved. He is to continue his home dose of aspirin and beta blocker, with Imdur added daily. He is not currently on a statin, but with his advanced age, dementia, and previous intolerance to multiple statins, this will be deferred for now. Adherence to a heart-healthy diet is encouraged. No further inpatient workup is indicated and he is considered stable for discharge, to follow up as an outpatient.  He is currently a full code, and this issue will be further discussed by Dr Burt Knack, with Mr Mceuen wife, as an outpatient.  Labs:   Lab Results  Component  Value Date   WBC 8.0 07/12/2014   HGB 13.6 07/12/2014   HCT 42.4 07/12/2014   MCV 95.3 07/12/2014   PLT 200 07/12/2014     Recent Labs Lab 07/11/14 2205  NA 141  K 5.4*  CL 104  CO2 34*  BUN 27*  CREATININE 1.67*  CALCIUM 9.4  PROT 6.8  BILITOT 0.8  ALKPHOS 58  ALT 12  AST 17  GLUCOSE 108*    Recent Labs  07/11/14 2205 07/12/14 0300 07/12/14 0900  TROPONINI <0.03 <0.03 <0.03   Lipid Panel     Component Value Date/Time   CHOL 177 07/12/2014 0300   TRIG 110 07/12/2014 0300   HDL 33* 07/12/2014 0300   CHOLHDL 5.4 07/12/2014 0300   VLDL 22 07/12/2014 0300   LDLCALC 122* 07/12/2014 0300    B NATRIURETIC PEPTIDE  Date/Time Value Ref Range Status  07/11/2014 07:58 PM 82.1 0.0 - 100.0 pg/mL Final      Radiology: Dg Chest Portable 1 View 07/11/2014   CLINICAL DATA:  Acute onset of generalized chest pain. Initial encounter.  EXAM: PORTABLE CHEST - 1 VIEW  COMPARISON:  Chest radiograph performed 03/05/2014  FINDINGS: The lungs are well-aerated and clear. There is no evidence of focal opacification, pleural effusion or pneumothorax.  The cardiomediastinal silhouette is borderline normal in size. No acute osseous abnormalities are seen. Chronic left-sided rib deformities are noted.  IMPRESSION: No acute cardiopulmonary process seen.   Electronically Signed   By: Garald Balding M.D.   On: 07/11/2014 19:42   EKG: SR, RBBB is old   FOLLOW UP PLANS  AND APPOINTMENTS Allergies  Allergen Reactions  . Aricept [Donepezil Hcl] Other (See Comments)    Altered mental status  . Ativan [Lorazepam] Other (See Comments)    Altered mental status  . Atorvastatin Nausea And Vomiting and Other (See Comments)    Joint pain  . Namenda [Memantine] Other (See Comments)    Altered mental status  . Simvastatin Nausea And Vomiting and Other (See Comments)    Joint pain  . Sorbitan Nausea And Vomiting    headache     Medication List    TAKE these medications        acetaminophen 325  MG tablet  Commonly known as:  TYLENOL  Take 650 mg by mouth every 6 (six) hours as needed for headache.     aspirin EC 81 MG tablet  Take 2 tablets (162 mg total) by mouth daily.     cholecalciferol 400 UNIT/ML Liqd  Commonly known as:  D-VI-SOL  Take 400 Units by mouth at bedtime.     CoQ10 100 MG Caps  Take 1 capsule by mouth daily.     ibuprofen 200 MG tablet  Commonly known as:  ADVIL,MOTRIN  Take 2 tablets (400 mg total) by mouth every 6 (six) hours as needed for headache.     Iron 18 MG Tbcr  Take 1 tablet by mouth daily.     isosorbide mononitrate 30 MG 24 hr tablet  Commonly known as:  IMDUR  Take 0.5 tablets (15 mg total) by mouth daily.     meclizine 12.5 MG tablet  Commonly known as:  ANTIVERT  Take 1 tablet (12.5 mg total) by mouth 3 (three) times daily as needed for dizziness.     Melatonin 5 MG Tabs  Take 5-10 mg by mouth daily as needed (sleep).     metoprolol tartrate 25 MG tablet  Commonly known as:  LOPRESSOR  Take 1 tablet (25 mg total) by mouth daily.     NITROSTAT 0.4 MG SL tablet  Generic drug:  nitroGLYCERIN  DISSOLVE ONE TABLET UNDER THE TONGUE EVERY 5 MINUTES AS NEEDED FOR CHEST PAIN     Red Yeast Rice 600 MG Caps  Take 1 capsule by mouth 2 (two) times daily.     SUPER GREENS PO  Take 2 tablets by mouth 2 (two) times daily. 2 tablets in the morning and 3 at night.     VITAMIN B-12 PO  Take 1 tablet by mouth daily.     vitamin C 500 MG tablet  Commonly known as:  ASCORBIC ACID  Take 500 mg by mouth daily.        Discharge Instructions    Diet - low sodium heart healthy    Complete by:  As directed      Increase activity slowly    Complete by:  As directed           Follow-up Information    Follow up with [name], MD.   Specialty:  Cardiology   Contact information:   [address] [address] [phone]       BRING ALL MEDICATIONS WITH YOU TO FOLLOW UP APPOINTMENTS  Time spent with  patient to include physician time: 42 min Signed: Rosaria Ferries, PA-C 07/12/2014, 11:06 AM Co-Sign MD

## 2014-08-23 ENCOUNTER — Telehealth: Payer: Self-pay | Admitting: Cardiovascular Disease

## 2014-08-23 NOTE — Telephone Encounter (Signed)
1. What dental office are you calling from? Dr. Diona Browner  2. What is your office phone and fax number? Phone: [phone] Fax: [phone]  3. What type of procedure is the patient having performed? Extracting multiple teeth  4. What date is procedure scheduled? Not scheduled, waiting for surg clearance  5. What is your question (ex. Antibiotics prior to procedure, holding medication-we need to know how long dentist wants pt to hold med)? States that they faxed surg clearance form and the doctor wrote notes.   Please call back and advise.

## 2014-08-24 NOTE — Telephone Encounter (Signed)
Left message on machine for David Collier to contact the office.

## 2014-08-24 NOTE — Telephone Encounter (Signed)
He is at low cardiac risk. Primary medical issue is progressive dementia. OK to proceed from my perspective. thx

## 2014-08-24 NOTE — Telephone Encounter (Signed)
I spoke with Aldona Bar and the pt needs to have 13 teeth extracted and partial shaving of his jaw bone performed prior to getting dentures.  Per Aldona Bar this procedure will be performed at [facility] under general anesthesia. Dr Hoyt Koch is requesting clearance for this pt to have the procedure performed.  I will forward this message to Dr Burt Knack for review.

## 2014-08-25 NOTE — Telephone Encounter (Signed)
Telephone encounter faxed to [phone].

## 2014-09-15 DIAGNOSIS — J019 Acute sinusitis, unspecified: Secondary | ICD-10-CM | POA: Diagnosis not present

## 2014-10-19 ENCOUNTER — Encounter (HOSPITAL_COMMUNITY): Payer: Self-pay

## 2014-10-19 ENCOUNTER — Encounter (HOSPITAL_COMMUNITY)
Admission: RE | Admit: 2014-10-19 | Discharge: 2014-10-19 | Disposition: A | Discharge status: Home / Self Care | Payer: Medicare Other | Source: Ambulatory Visit | Attending: Oral Surgery | Admitting: Oral Surgery

## 2014-10-19 DIAGNOSIS — Z8673 Personal history of transient ischemic attack (TIA), and cerebral infarction without residual deficits: Secondary | ICD-10-CM | POA: Diagnosis not present

## 2014-10-19 DIAGNOSIS — G309 Alzheimer's disease, unspecified: Secondary | ICD-10-CM | POA: Diagnosis not present

## 2014-10-19 DIAGNOSIS — K088 Other specified disorders of teeth and supporting structures: Secondary | ICD-10-CM | POA: Diagnosis not present

## 2014-10-19 DIAGNOSIS — H3530 Unspecified macular degeneration: Secondary | ICD-10-CM | POA: Diagnosis not present

## 2014-10-19 DIAGNOSIS — Z79899 Other long term (current) drug therapy: Secondary | ICD-10-CM | POA: Diagnosis not present

## 2014-10-19 DIAGNOSIS — Z951 Presence of aortocoronary bypass graft: Secondary | ICD-10-CM | POA: Diagnosis not present

## 2014-10-19 DIAGNOSIS — I2510 Atherosclerotic heart disease of native coronary artery without angina pectoris: Secondary | ICD-10-CM | POA: Diagnosis not present

## 2014-10-19 DIAGNOSIS — E785 Hyperlipidemia, unspecified: Secondary | ICD-10-CM | POA: Diagnosis not present

## 2014-10-19 DIAGNOSIS — M1990 Unspecified osteoarthritis, unspecified site: Secondary | ICD-10-CM | POA: Diagnosis not present

## 2014-10-19 DIAGNOSIS — I252 Old myocardial infarction: Secondary | ICD-10-CM | POA: Diagnosis not present

## 2014-10-19 DIAGNOSIS — K029 Dental caries, unspecified: Secondary | ICD-10-CM | POA: Diagnosis not present

## 2014-10-19 DIAGNOSIS — N183 Chronic kidney disease, stage 3 (moderate): Secondary | ICD-10-CM | POA: Diagnosis not present

## 2014-10-19 DIAGNOSIS — Z7982 Long term (current) use of aspirin: Secondary | ICD-10-CM | POA: Diagnosis not present

## 2014-10-19 DIAGNOSIS — Z87891 Personal history of nicotine dependence: Secondary | ICD-10-CM | POA: Diagnosis not present

## 2014-10-19 DIAGNOSIS — Z888 Allergy status to other drugs, medicaments and biological substances status: Secondary | ICD-10-CM | POA: Diagnosis not present

## 2014-10-19 DIAGNOSIS — F0280 Dementia in other diseases classified elsewhere without behavioral disturbance: Secondary | ICD-10-CM | POA: Diagnosis not present

## 2014-10-19 DIAGNOSIS — K0530 Chronic periodontitis, unspecified: Secondary | ICD-10-CM | POA: Diagnosis not present

## 2014-10-19 HISTORY — DX: Headache: R51

## 2014-10-19 HISTORY — DX: Depression, unspecified: F32.A

## 2014-10-19 HISTORY — DX: Headache, unspecified: R51.9

## 2014-10-19 HISTORY — DX: Angina pectoris, unspecified: I20.9

## 2014-10-19 HISTORY — DX: Major depressive disorder, single episode, unspecified: F32.9

## 2014-10-19 HISTORY — DX: Essential (primary) hypertension: I10

## 2014-10-19 LAB — CBC
HCT: 44.3 % (ref 39.0–52.0)
HEMOGLOBIN: 14.4 g/dL (ref 13.0–17.0)
MCH: 30.4 pg (ref 26.0–34.0)
MCHC: 32.5 g/dL (ref 30.0–36.0)
MCV: 93.5 fL (ref 78.0–100.0)
Platelets: 195 10*3/uL (ref 150–400)
RBC: 4.74 MIL/uL (ref 4.22–5.81)
RDW: 13.3 % (ref 11.5–15.5)
WBC: 8.8 10*3/uL (ref 4.0–10.5)

## 2014-10-19 LAB — BASIC METABOLIC PANEL
ANION GAP: 6 (ref 5–15)
BUN: 27 mg/dL — ABNORMAL HIGH (ref 6–20)
CHLORIDE: 108 mmol/L (ref 101–111)
CO2: 26 mmol/L (ref 22–32)
Calcium: 9.2 mg/dL (ref 8.9–10.3)
Creatinine, Ser: 1.61 mg/dL — ABNORMAL HIGH (ref 0.61–1.24)
GFR calc Af Amer: 44 mL/min — ABNORMAL LOW (ref >60)
GFR calc non Af Amer: 38 mL/min — ABNORMAL LOW (ref >60)
Glucose, Bld: 94 mg/dL (ref 65–99)
POTASSIUM: 4.9 mmol/L (ref 3.5–5.1)
SODIUM: 140 mmol/L (ref 135–145)

## 2014-10-19 NOTE — Progress Notes (Signed)
Anesthesia Chart Review:  Pt is 81 year old male scheduled for multiple dental extractions with alveoloplasty on 10/22/2014 with Dr. Hoyt Koch.   Cardiologist is Dr. Burt Knack.   PMH includes: CAD (BMS circumflex and OM 2006, PTCA ISR 2010, moderate LAD disease), MI x5 (in 1980's), HTN, stroke, TIA, hyperlipidemia, CKD (stage 3), Alzheimer's dementia. Former smoker. BMI 30.  Medications include: ASA, imdur, metoprolol.   Preoperative labs reviewed.  Cr 1.61; this is consistent with results dating back to 2014. Pt has stage 3 CKD.   1 view CXR 07/11/2014: No acute cardiopulmonary process seen.  EKG 07/12/2014: Sinus bradycardia with 1st degree A-V block. RBBB.  Echo 03/06/2014: - Left ventricle: The cavity size was normal. There was mild concentric hypertrophy. Systolic function was normal. The estimated ejection fraction was in the range of 60% to 65%. Wall motion was normal; there were no regional wall motion abnormalities. Doppler parameters are consistent with abnormal left ventricular relaxation (grade 1 diastolic dysfunction). The E/e&' ratio is <8, suggesting normal LV filling pressure. - Left atrium: The atrium was normal in size. - Right ventricle: The cavity size was mildly dilated. Systolic function was normal. Lateral annulus peak S velocity: 9.3 cm/s. - Right atrium: The atrium was at the upper limits of normal in size. - Systemic veins: The IVC was not visualized.  Carotid duplex US 03/06/2014: -Right greater than left carotid atherosclerosis. No hemodynamically significant ICA stenosis by ultrasound. Degree of narrowing less than 50% bilaterally.  Pt has cardiac clearance from Dr. Burt Knack in [place] telephone encounter dated 08/24/2014.   If no changes, I anticipate pt can proceed with surgery as scheduled.   Willeen Cass, FNP-BC [facility]/Anesthesiology Phone: [phone] 10/19/2014 4:16 PM

## 2014-10-19 NOTE — Progress Notes (Signed)
   10/19/14 1224  OBSTRUCTIVE SLEEP APNEA  Have you ever been diagnosed with sleep apnea through a sleep study? No  Do you snore loudly (loud enough to be heard through closed doors)?  0  Do you often feel tired, fatigued, or sleepy during the daytime? 1  Has anyone observed you stop breathing during your sleep? 0  Do you have, or are you being treated for high blood pressure? 1  BMI more than 35 kg/m2? 0  Age over 79 years old? 1  Neck circumference greater than 40 cm/16 inches? 1  Gender: 1

## 2014-10-19 NOTE — H&P (Signed)
HISTORY AND PHYSICAL  David Collier is a 81 y.o. male patient with CC: painful teeth.   No diagnosis found.  Past Medical History  Diagnosis Date  . Hyperlipidemia   . Myocardial infarction   . Coronary atherosclerosis of native coronary artery     BMS circumflex and OM 2006, PTCA ISR 2010, moderate LAD disease  . DJD (degenerative joint disease)   . Macular degeneration   . Alzheimer's dementia   . CKD (chronic kidney disease), stage III   . History of stroke     Seen radiographically-left PICA; unknown to patient.  Marland Kitchen TIA (transient ischemic attack) 03/05/2014  . 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 CAD (coronary artery disease), native coronary artery     Initially diagnosed 2003 2006 catheterization with bare metal stenting of the first marginal branch of the circumflex and continuation branch of the circumflex 2010 catheterization showing normal left main, 50-60% LAD unchanged from before, proximal stent of marginal patent, severe in-stent restenosis treated with cutting balloon angioplasty, patent stent in continuation branch with mild in-stent restenosis, occluded nondominant right coronary artery.     No current facility-administered medications for this encounter.   Current Outpatient Prescriptions  Medication Sig Dispense Refill  . acetaminophen (TYLENOL) 325 MG tablet Take 650 mg by mouth every 6 (six) hours as needed for headache.    . Ascorbic Acid (VITAMIN C) 500 MG tablet Take 500 mg by mouth daily.      Marland Kitchen aspirin EC 81 MG tablet Take 2 tablets (162 mg total) by mouth daily.    . cholecalciferol (D-VI-SOL) 400 UNIT/ML LIQD Take 400 Units by mouth at bedtime.     . Coenzyme Q10 (COQ10) 100 MG CAPS Take 1 capsule by mouth daily.     . Cyanocobalamin (VITAMIN B-12 PO) Take 1 tablet by mouth daily.    . Ferrous Fumarate (IRON) 18 MG TBCR Take 1 tablet by mouth daily.    Marland Kitchen ibuprofen (ADVIL,MOTRIN) 200 MG tablet Take 2 tablets (400 mg total) by mouth  every 6 (six) hours as needed for headache.    . isosorbide mononitrate (IMDUR) 30 MG 24 hr tablet Take 0.5 tablets (15 mg total) by mouth daily. 30 tablet 11  . meclizine (ANTIVERT) 12.5 MG tablet Take 1 tablet (12.5 mg total) by mouth 3 (three) times daily as needed for dizziness. 30 tablet 0  . Melatonin 5 MG TABS Take 5-10 mg by mouth daily as needed (sleep).     . metoprolol (LOPRESSOR) 25 MG tablet Take 1 tablet (25 mg total) by mouth daily. 60 tablet 11  . Misc Natural Products (SUPER GREENS PO) Take 2 tablets by mouth 2 (two) times daily. 2 tablets in the morning and 3 at night.    Marland Kitchen NITROSTAT 0.4 MG SL tablet DISSOLVE ONE TABLET UNDER THE TONGUE EVERY 5 MINUTES AS NEEDED FOR CHEST PAIN 25 tablet 3  . Red Yeast Rice 600 MG CAPS Take 1 capsule by mouth 2 (two) times daily.       Allergies  Allergen Reactions  . Aricept [Donepezil Hcl] Other (See Comments)    Altered mental status  . Ativan [Lorazepam] Other (See Comments)    Altered mental status  . Atorvastatin Nausea And Vomiting and Other (See Comments)    Joint pain  . Namenda [Memantine] Other (See Comments)    Altered mental status  . Simvastatin Nausea And Vomiting and Other (See Comments)    Joint pain  . Sorbitan Nausea And Vomiting  headache   Active Problems:   * No active hospital problems. *  Vitals: There were no vitals taken for this visit. Lab results:No results found for this or any previous visit (from the past 66 hour(s)). Radiology Results: No results found. General appearance: alert, cooperative and no distress Head: Normocephalic, without obvious abnormality, atraumatic Eyes: negative Nose: Nares normal. Septum midline. Mucosa normal. No drainage or sinus tenderness. Throat: dental caries teeth #'s 4, 6, 7, 8, 10, 21-28 with periodontal disease. No purulence, edema, fluctuance. Pharynx clear. Neck: no adenopathy, supple, symmetrical, trachea midline and thyroid not enlarged, symmetric, no  tenderness/mass/nodules Resp: clear to auscultation bilaterally Cardio: regular rate and rhythm, S1, S2 normal, no murmur, click, rub or gallop  Assessment: Non-restorable teeth 4, 6, 7, 8, 10, 21, 22, 23, 24, 25, 26, 27, 28 secondary to dental caries and periodontitis.  Plan: Full mouth extractions, alveoloplasty. General anesthesia. Day surgery.   Gae Bon 10/19/2014

## 2014-10-21 MED ORDER — CEFAZOLIN SODIUM-DEXTROSE 2-3 GM-% IV SOLR
2.0000 g | INTRAVENOUS | Status: DC
  Filled 2014-10-21: qty 50

## 2014-10-22 ENCOUNTER — Encounter (HOSPITAL_COMMUNITY)
Admission: RE | Disposition: A | Discharge status: Home / Self Care | Payer: Self-pay | Source: Ambulatory Visit | Attending: Oral Surgery

## 2014-10-22 ENCOUNTER — Encounter (HOSPITAL_COMMUNITY): Payer: Self-pay | Admitting: *Deleted

## 2014-10-22 ENCOUNTER — Ambulatory Visit (HOSPITAL_COMMUNITY): Payer: Medicare Other | Admitting: Certified Registered Nurse Anesthetist

## 2014-10-22 ENCOUNTER — Ambulatory Visit (HOSPITAL_COMMUNITY)
Admission: RE | Admit: 2014-10-22 | Discharge: 2014-10-22 | Disposition: A | Discharge status: Home / Self Care | Payer: Medicare Other | Source: Ambulatory Visit | Attending: Oral Surgery | Admitting: Oral Surgery

## 2014-10-22 ENCOUNTER — Ambulatory Visit (HOSPITAL_COMMUNITY): Payer: Medicare Other | Admitting: Emergency Medicine

## 2014-10-22 DIAGNOSIS — Z888 Allergy status to other drugs, medicaments and biological substances status: Secondary | ICD-10-CM | POA: Diagnosis not present

## 2014-10-22 DIAGNOSIS — E785 Hyperlipidemia, unspecified: Secondary | ICD-10-CM | POA: Insufficient documentation

## 2014-10-22 DIAGNOSIS — I2510 Atherosclerotic heart disease of native coronary artery without angina pectoris: Secondary | ICD-10-CM | POA: Diagnosis not present

## 2014-10-22 DIAGNOSIS — Z79899 Other long term (current) drug therapy: Secondary | ICD-10-CM | POA: Diagnosis not present

## 2014-10-22 DIAGNOSIS — H3530 Unspecified macular degeneration: Secondary | ICD-10-CM | POA: Diagnosis not present

## 2014-10-22 DIAGNOSIS — Z87891 Personal history of nicotine dependence: Secondary | ICD-10-CM | POA: Diagnosis not present

## 2014-10-22 DIAGNOSIS — Z7982 Long term (current) use of aspirin: Secondary | ICD-10-CM | POA: Insufficient documentation

## 2014-10-22 DIAGNOSIS — N183 Chronic kidney disease, stage 3 (moderate): Secondary | ICD-10-CM | POA: Insufficient documentation

## 2014-10-22 DIAGNOSIS — I252 Old myocardial infarction: Secondary | ICD-10-CM | POA: Diagnosis not present

## 2014-10-22 DIAGNOSIS — Z8673 Personal history of transient ischemic attack (TIA), and cerebral infarction without residual deficits: Secondary | ICD-10-CM | POA: Insufficient documentation

## 2014-10-22 DIAGNOSIS — M1990 Unspecified osteoarthritis, unspecified site: Secondary | ICD-10-CM | POA: Diagnosis not present

## 2014-10-22 DIAGNOSIS — K029 Dental caries, unspecified: Secondary | ICD-10-CM | POA: Insufficient documentation

## 2014-10-22 DIAGNOSIS — F0280 Dementia in other diseases classified elsewhere without behavioral disturbance: Secondary | ICD-10-CM | POA: Diagnosis not present

## 2014-10-22 DIAGNOSIS — G309 Alzheimer's disease, unspecified: Secondary | ICD-10-CM | POA: Diagnosis not present

## 2014-10-22 DIAGNOSIS — K0530 Chronic periodontitis, unspecified: Secondary | ICD-10-CM | POA: Diagnosis not present

## 2014-10-22 DIAGNOSIS — K088 Other specified disorders of teeth and supporting structures: Secondary | ICD-10-CM | POA: Insufficient documentation

## 2014-10-22 DIAGNOSIS — Z951 Presence of aortocoronary bypass graft: Secondary | ICD-10-CM | POA: Diagnosis not present

## 2014-10-22 HISTORY — PX: MULTIPLE EXTRACTIONS WITH ALVEOLOPLASTY: SHX5342

## 2014-10-22 SURGERY — MULTIPLE EXTRACTION WITH ALVEOLOPLASTY
Anesthesia: General | Site: Mouth

## 2014-10-22 MED ORDER — AMOXICILLIN 500 MG PO CAPS: 500.0000 mg | ORAL_CAPSULE | Freq: Three times a day (TID) | ORAL | Status: DC

## 2014-10-22 MED ORDER — SODIUM CHLORIDE 0.9 % IV SOLN
INTRAVENOUS | Status: DC
  Administered 2014-10-22: 08:00:00 via INTRAVENOUS

## 2014-10-22 MED ORDER — LIDOCAINE HCL (CARDIAC) 20 MG/ML IV SOLN
INTRAVENOUS | Status: DC | PRN
  Administered 2014-10-22: 50 mg via INTRAVENOUS

## 2014-10-22 MED ORDER — FENTANYL CITRATE (PF) 100 MCG/2ML IJ SOLN
INTRAMUSCULAR | Status: DC | PRN
  Administered 2014-10-22 (×5): 50 ug via INTRAVENOUS

## 2014-10-22 MED ORDER — 0.9 % SODIUM CHLORIDE (POUR BTL) OPTIME
TOPICAL | Status: DC | PRN
  Administered 2014-10-22: 1000 mL

## 2014-10-22 MED ORDER — OXYCODONE-ACETAMINOPHEN 5-325 MG PO TABS: 1.0000 | ORAL_TABLET | ORAL | Status: DC | PRN

## 2014-10-22 MED ORDER — SODIUM CHLORIDE 0.9 % IR SOLN
Status: DC | PRN
  Administered 2014-10-22: 1000 mL

## 2014-10-22 MED ORDER — ONDANSETRON HCL 4 MG/2ML IJ SOLN
INTRAMUSCULAR | Status: DC | PRN
  Administered 2014-10-22: 4 mg via INTRAVENOUS

## 2014-10-22 MED ORDER — FENTANYL CITRATE (PF) 250 MCG/5ML IJ SOLN
INTRAMUSCULAR | Status: AC
Start: 2014-10-22 — End: 2014-10-22
  Filled 2014-10-22: qty 5

## 2014-10-22 MED ORDER — GLYCOPYRROLATE 0.2 MG/ML IJ SOLN
INTRAMUSCULAR | Status: DC | PRN
  Administered 2014-10-22: 0.4 mg via INTRAVENOUS

## 2014-10-22 MED ORDER — OXYMETAZOLINE HCL 0.05 % NA SOLN
NASAL | Status: DC | PRN
  Administered 2014-10-22: 1

## 2014-10-22 MED ORDER — PROPOFOL 10 MG/ML IV BOLUS
INTRAVENOUS | Status: DC | PRN
  Administered 2014-10-22: 150 mg via INTRAVENOUS
  Administered 2014-10-22: 50 mg via INTRAVENOUS

## 2014-10-22 MED ORDER — EPHEDRINE SULFATE 50 MG/ML IJ SOLN
INTRAMUSCULAR | Status: DC | PRN
  Administered 2014-10-22: 15 mg via INTRAVENOUS

## 2014-10-22 MED ORDER — LIDOCAINE-EPINEPHRINE 2 %-1:100000 IJ SOLN
INTRAMUSCULAR | Status: DC | PRN
  Administered 2014-10-22: 20 mL via INTRADERMAL

## 2014-10-22 MED ORDER — DEXAMETHASONE SODIUM PHOSPHATE 4 MG/ML IJ SOLN
INTRAMUSCULAR | Status: DC | PRN
  Administered 2014-10-22: 8 mg via INTRAVENOUS

## 2014-10-22 MED ORDER — LACTATED RINGERS IV SOLN
INTRAVENOUS | Status: DC
Start: 2014-10-22 — End: 2014-10-22

## 2014-10-22 MED ORDER — LACTATED RINGERS IV SOLN
INTRAVENOUS | Status: DC | PRN
  Administered 2014-10-22 (×2): via INTRAVENOUS

## 2014-10-22 MED ORDER — PROPOFOL 10 MG/ML IV BOLUS
INTRAVENOUS | Status: AC
  Filled 2014-10-22: qty 20

## 2014-10-22 SURGICAL SUPPLY — 32 items
BUR CROSS CUT FISSURE 1.6 (BURR) ×2 IMPLANT
BUR CROSS CUT FISSURE 1.6MM (BURR) ×1
BUR EGG ELITE 4.0 (BURR) ×2 IMPLANT
BUR EGG ELITE 4.0MM (BURR) ×1
CANISTER SUCTION 2500CC (MISCELLANEOUS) ×3 IMPLANT
COVER SURGICAL LIGHT HANDLE (MISCELLANEOUS) ×3 IMPLANT
CRADLE DONUT ADULT HEAD (MISCELLANEOUS) ×3 IMPLANT
FLUID NSS /IRRIG 1000 ML XXX (MISCELLANEOUS) ×3 IMPLANT
GAUZE PACKING FOLDED 2  STR (GAUZE/BANDAGES/DRESSINGS) ×2
GAUZE PACKING FOLDED 2 STR (GAUZE/BANDAGES/DRESSINGS) ×1 IMPLANT
GLOVE BIO SURGEON STRL SZ 6.5 (GLOVE) ×2 IMPLANT
GLOVE BIO SURGEON STRL SZ7.5 (GLOVE) ×3 IMPLANT
GLOVE BIO SURGEONS STRL SZ 6.5 (GLOVE) ×1
GLOVE BIOGEL PI IND STRL 6.5 (GLOVE) ×1 IMPLANT
GLOVE BIOGEL PI IND STRL 7.0 (GLOVE) ×1 IMPLANT
GLOVE BIOGEL PI INDICATOR 6.5 (GLOVE) ×2
GLOVE BIOGEL PI INDICATOR 7.0 (GLOVE) ×2
GLOVE SURG SS PI 6.5 STRL IVOR (GLOVE) ×3 IMPLANT
GOWN STRL REUS W/ TWL LRG LVL3 (GOWN DISPOSABLE) ×1 IMPLANT
GOWN STRL REUS W/ TWL XL LVL3 (GOWN DISPOSABLE) ×1 IMPLANT
GOWN STRL REUS W/TWL LRG LVL3 (GOWN DISPOSABLE) ×2
GOWN STRL REUS W/TWL XL LVL3 (GOWN DISPOSABLE) ×2
KIT BASIN OR (CUSTOM PROCEDURE TRAY) ×3 IMPLANT
KIT ROOM TURNOVER OR (KITS) ×3 IMPLANT
NEEDLE 22X1 1/2 (OR ONLY) (NEEDLE) ×6 IMPLANT
NS IRRIG 1000ML POUR BTL (IV SOLUTION) ×3 IMPLANT
PAD ARMBOARD 7.5X6 YLW CONV (MISCELLANEOUS) ×3 IMPLANT
SUT CHROMIC 3 0 PS 2 (SUTURE) ×6 IMPLANT
SYR CONTROL 10ML LL (SYRINGE) ×3 IMPLANT
TRAY ENT MC OR (CUSTOM PROCEDURE TRAY) ×3 IMPLANT
TUBING IRRIGATION (MISCELLANEOUS) ×3 IMPLANT
YANKAUER SUCT BULB TIP NO VENT (SUCTIONS) ×3 IMPLANT

## 2014-10-22 NOTE — Anesthesia Preprocedure Evaluation (Signed)
Anesthesia Evaluation  Patient identified by MRN, date of birth, ID band Patient awake and Patient confused    Reviewed: Allergy & Precautions, NPO status , Patient's Chart, lab work & pertinent test results  Airway Mallampati: II  TM Distance: >3 FB Neck ROM: Full    Dental  (+) Poor Dentition   Pulmonary former smoker,  breath sounds clear to auscultation        Cardiovascular hypertension, Rhythm:Regular Rate:Normal     Neuro/Psych    GI/Hepatic   Endo/Other    Renal/GU      Musculoskeletal   Abdominal   Peds  Hematology   Anesthesia Other Findings   Reproductive/Obstetrics                             Anesthesia Physical Anesthesia Plan  ASA: III  Anesthesia Plan: General   Post-op Pain Management:    Induction: Intravenous  Airway Management Planned: Nasal ETT  Additional Equipment:   Intra-op Plan:   Post-operative Plan: Extubation in OR  Informed Consent: I have reviewed the patients History and Physical, chart, labs and discussed the procedure including the risks, benefits and alternatives for the proposed anesthesia with the patient or authorized representative who has indicated his/her understanding and acceptance.   Dental advisory given  Plan Discussed with: CRNA and Anesthesiologist  Anesthesia Plan Comments:         Anesthesia Quick Evaluation

## 2014-10-22 NOTE — Anesthesia Procedure Notes (Signed)
Procedure Name: Intubation Date/Time: 10/22/2014 8:59 AM Performed by: Shirlyn Goltz Pre-anesthesia Checklist: Patient identified, Emergency Drugs available, Suction available and Patient being monitored Patient Re-evaluated:Patient Re-evaluated prior to inductionOxygen Delivery Method: Circle system utilized Preoxygenation: Pre-oxygenation with 100% oxygen Intubation Type: IV induction Ventilation: Mask ventilation without difficulty and Oral airway inserted - appropriate to patient size Laryngoscope Size: Mac and 4 Grade View: Grade I Nasal Tubes: Nasal Rae and Magill forceps- large, utilized Tube size: 7.0 mm Number of attempts: 1 Airway Equipment and Method: Stylet and Bougie stylet Placement Confirmation: ETT inserted through vocal cords under direct vision,  positive ETCO2 and breath sounds checked- equal and bilateral Tube secured with: Tape Dental Injury: Teeth and Oropharynx as per pre-operative assessment

## 2014-10-22 NOTE — H&P (Signed)
H&P documentation  -History and Physical Reviewed  -Patient has been re-examined  -No change in the plan of care  David Collier M  

## 2014-10-22 NOTE — Transfer of Care (Signed)
Immediate Anesthesia Transfer of Care Note  Patient: David Collier  Procedure(s) Performed: Procedure(s): MULTIPLE EXTRACTIONS  # 4,6, 7, 8, 10, 21, 22, 23, 24, 25, 26, 27, 28 and ALVEOLOPLASTY (N/A)  Patient Location: PACU  Anesthesia Type:General  Level of Consciousness: awake, alert  and patient cooperative  Airway & Oxygen Therapy: Patient Spontanous Breathing and Patient connected to face mask oxygen  Post-op Assessment: Report given to RN, Post -op Vital signs reviewed and stable, Patient moving all extremities and Patient moving all extremities X 4  Post vital signs: Reviewed and stable  Last Vitals:  Filed Vitals:   10/22/14 0700  BP: 132/68  Pulse: 54  Temp: 36.6 C  Resp: 20    Complications: No apparent anesthesia complications

## 2014-10-22 NOTE — Anesthesia Postprocedure Evaluation (Signed)
  Anesthesia Post-op Note  Patient: David Collier  Procedure(s) Performed: Procedure(s): MULTIPLE EXTRACTIONS  # 4,6, 7, 8, 10, 21, 22, 23, 24, 25, 26, 27, 28 and ALVEOLOPLASTY (N/A)  Patient Location: PACU  Anesthesia Type:General  Level of Consciousness: awake, alert  and oriented  Airway and Oxygen Therapy: Patient Spontanous Breathing and Patient connected to nasal cannula oxygen  Post-op Pain: mild  Post-op Assessment: Post-op Vital signs reviewed, Patient's Cardiovascular Status Stable, Respiratory Function Stable, Patent Airway and Pain level controlled              Post-op Vital Signs: stable  Last Vitals:  Filed Vitals:   10/22/14 1015  BP: 116/57  Pulse: 85  Temp: 36.2 C  Resp: 23    Complications: No apparent anesthesia complications

## 2014-10-22 NOTE — Op Note (Signed)
10/22/2014  9:38 AM  PATIENT:  David Collier  81 y.o. male  PRE-OPERATIVE DIAGNOSIS:  NON RESTORABLE TEETH # 4, 6, 7, 8, 10, 21, 22, 23, 24, 25, 26, 27, 28  POST-OPERATIVE DIAGNOSIS:  SAME  PROCEDURE:  Procedure(s): MULTIPLE EXTRACTIONS  # 4,  6, 7, 8, 10, 21, 22, 23, 24, 25, 26, 27, 28 and ALVEOLOPLASTY  SURGEON:  Surgeon(s): Diona Browner, DDS  ANESTHESIA:   local and general  EBL:  minimal  DRAINS: none   SPECIMEN:  No Specimen  COUNTS:  YES  PLAN OF CARE: Discharge to home after PACU  PATIENT DISPOSITION:  PACU - hemodynamically stable.   PROCEDURE DETAILS: Dictation # [id number]  Gae Bon, DMD 10/22/2014 9:38 AM

## 2014-10-23 ENCOUNTER — Emergency Department (HOSPITAL_COMMUNITY)
Admission: EM | Admit: 2014-10-23 | Discharge: 2014-10-23 | Disposition: A | Discharge status: Home / Self Care | Payer: Medicare Other | Attending: Emergency Medicine | Admitting: Emergency Medicine

## 2014-10-23 ENCOUNTER — Encounter (HOSPITAL_COMMUNITY): Payer: Self-pay | Admitting: Emergency Medicine

## 2014-10-23 DIAGNOSIS — N183 Chronic kidney disease, stage 3 (moderate): Secondary | ICD-10-CM | POA: Insufficient documentation

## 2014-10-23 DIAGNOSIS — Z8673 Personal history of transient ischemic attack (TIA), and cerebral infarction without residual deficits: Secondary | ICD-10-CM | POA: Insufficient documentation

## 2014-10-23 DIAGNOSIS — I25119 Atherosclerotic heart disease of native coronary artery with unspecified angina pectoris: Secondary | ICD-10-CM | POA: Diagnosis not present

## 2014-10-23 DIAGNOSIS — Z87891 Personal history of nicotine dependence: Secondary | ICD-10-CM | POA: Diagnosis not present

## 2014-10-23 DIAGNOSIS — I129 Hypertensive chronic kidney disease with stage 1 through stage 4 chronic kidney disease, or unspecified chronic kidney disease: Secondary | ICD-10-CM | POA: Insufficient documentation

## 2014-10-23 DIAGNOSIS — Z79899 Other long term (current) drug therapy: Secondary | ICD-10-CM | POA: Insufficient documentation

## 2014-10-23 DIAGNOSIS — G309 Alzheimer's disease, unspecified: Secondary | ICD-10-CM | POA: Diagnosis not present

## 2014-10-23 DIAGNOSIS — Z7982 Long term (current) use of aspirin: Secondary | ICD-10-CM | POA: Insufficient documentation

## 2014-10-23 DIAGNOSIS — M1990 Unspecified osteoarthritis, unspecified site: Secondary | ICD-10-CM | POA: Diagnosis not present

## 2014-10-23 DIAGNOSIS — Z8659 Personal history of other mental and behavioral disorders: Secondary | ICD-10-CM | POA: Insufficient documentation

## 2014-10-23 DIAGNOSIS — F0280 Dementia in other diseases classified elsewhere without behavioral disturbance: Secondary | ICD-10-CM | POA: Insufficient documentation

## 2014-10-23 DIAGNOSIS — R339 Retention of urine, unspecified: Secondary | ICD-10-CM | POA: Insufficient documentation

## 2014-10-23 DIAGNOSIS — I252 Old myocardial infarction: Secondary | ICD-10-CM | POA: Insufficient documentation

## 2014-10-23 DIAGNOSIS — Z9861 Coronary angioplasty status: Secondary | ICD-10-CM | POA: Insufficient documentation

## 2014-10-23 DIAGNOSIS — Z8639 Personal history of other endocrine, nutritional and metabolic disease: Secondary | ICD-10-CM | POA: Diagnosis not present

## 2014-10-23 DIAGNOSIS — Z792 Long term (current) use of antibiotics: Secondary | ICD-10-CM | POA: Insufficient documentation

## 2014-10-23 LAB — URINALYSIS, ROUTINE W REFLEX MICROSCOPIC
Bilirubin Urine: NEGATIVE
GLUCOSE, UA: NEGATIVE mg/dL
KETONES UR: NEGATIVE mg/dL
Leukocytes, UA: NEGATIVE
Nitrite: NEGATIVE
PROTEIN: NEGATIVE mg/dL
SPECIFIC GRAVITY, URINE: 1.01 (ref 1.005–1.030)
UROBILINOGEN UA: 0.2 mg/dL (ref 0.0–1.0)
pH: 6 (ref 5.0–8.0)

## 2014-10-23 LAB — URINE MICROSCOPIC-ADD ON

## 2014-10-23 NOTE — Op Note (Signed)
David Collier, David Collier              ACCOUNT NO.:  [id number]  MEDICAL RECORD NO.:  [id number]  LOCATION:  [facility]                         FACILITY:  [facility]  PHYSICIAN:  Gae Bon, M.D.  DATE OF BIRTH:  1928-10-08  DATE OF PROCEDURE:  10/22/2014 DATE OF DISCHARGE:  10/22/2014                              OPERATIVE REPORT   PREOPERATIVE DIAGNOSIS:  Nonrestorable teeth numbers 4, 6, 7, 8, 10, 21, 22, 23, 24, 25, 26, 27, 28, secondary to dental caries and periodontal disease.  POSTOPERATIVE DIAGNOSIS:  Nonrestorable teeth numbers 4, 6, 7, 8, 10, 21, 22, 23, 24, 25, 26, 27, 28, secondary to dental caries and periodontal disease.  PROCEDURES:  Extraction of teeth numbers 4, 6, 7, 8, 10, 21, 22, 23, 24, 25, 26, 27, 28, Alveoplasty of right and left maxilla and mandible.  SURGEON:  Gae Bon, M.D.  ANESTHESIA:  General nasal intubation.  Dr. Linna Caprice, attending.  DESCRIPTION OF PROCEDURE:  The patient was taken to the operating room, placed on the table in supine position.  General anesthesia was administered and a nasal endotracheal tube was placed and secured.  The eyes were protected and the patient was draped for the procedure.  Time- out was performed.  The posterior pharynx was suctioned and a throat pack was placed.  A 2% lidocaine with 1:100,000 epinephrine was infiltrated in an inferior alveolar block on the right and left sides and in buccal and palatal infiltration in the maxilla and mandible.  A Sweetheart retractor was placed in the right side of the mouth and a bite block was used to retract the tongue and keep the mouth open and then a 15 blade was used to make an incision, beginning distal to tooth #21 and carrying anteriorly in the gingival sulcus until tooth #27 was encountered.  This incision was made on the buccal and lingual aspects of the teeth.  Then, in the maxilla, a 15 blade was used to make an incision distal to tooth #10 using a 15 blade and then  carried anteriorly to tooth #6 and a buccal and palatal gingival sulcus.  The periosteum was reflected in these quadrants using the periosteal elevator.  The teeth were elevated with a 301 elevator.  The lower teeth were removed from the mouth with the Asch forceps with the exception of tooth #21 which fractured during removal.  Additional bone was removed using the Stryker handpiece under irrigation to remove the root.  The root was elevated with a 301 elevator and then in the maxilla, the teeth were removed with the upper dental forceps.  The sockets were then curetted.  The periosteum was reflected and an alveoplasty was performed using the egg-shaped burr and a bone file.  Then, the bite block and Sweetheart retractor were repositioned to the other side of the mouth. A 15 blade was used to make an incision around teeth #27, #28 in the mandible and around teeth numbers 4 and 6 in the maxilla.  The periosteum was then reflected from around these teeth.  The teeth were elevated and then removed from the mouth with a dental forceps.  The sockets were curetted.  Then, the periosteum was reflected  further to expose the alveolar crest.  An alveoplasty was performed using the egg- shaped burr and bone file.  Then, the areas were irrigated and closed with 3-0 chromic.  The oral cavity was inspected, found to have good contour hemostasis and closure.  The oral cavity was irrigated and suctioned.  The throat pack was removed.  The patient was awakened, taken to the recovery room, breathing spontaneously in good condition.  ESTIMATED BLOOD LOSS:  Minimal.  COMPLICATIONS:  None.  SPECIMENS:  None.     Gae Bon, M.D.     SMJ/MEDQ  D:  10/22/2014  T:  10/23/2014  Job:  [id number]

## 2014-10-23 NOTE — ED Provider Notes (Signed)
CSN: [id number]     Arrival date & time 10/23/14  1356 History   First MD Initiated Contact with Patient 10/23/14 1508     Chief Complaint  Patient presents with  . Urinary Retention      HPI Pt was seen at 1510. Per pt and his wife, c/o gradual onset and persistence of constant "urine dribbling" since this morning. Pt's wife states "his bladder is full." Pt denies hx of same. Pt's wife states pt had multiple dental extractions under anesthesia yesterday. Denies dysuria/hematuria, no testicular pain/swelling, no back/flank pain, no fevers.    Past Medical History  Diagnosis Date  . Hyperlipidemia   . Coronary atherosclerosis of native coronary artery     BMS circumflex and OM 2006, PTCA ISR 2010, moderate LAD disease  . DJD (degenerative joint disease)   . Macular degeneration   . Alzheimer's dementia   . CKD (chronic kidney disease), stage III   . History of stroke     Seen radiographically-left PICA; unknown to patient.  Marland Kitchen TIA (transient ischemic attack) 03/05/2014  . 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 CAD (coronary artery disease), native coronary artery     Initially diagnosed 2003 2006 catheterization with bare metal stenting of the first marginal branch of the circumflex and continuation branch of the circumflex 2010 catheterization showing normal left main, 50-60% LAD unchanged from before, proximal stent of marginal patent, severe in-stent restenosis treated with cutting balloon angioplasty, patent stent in continuation branch with mild in-stent restenosis, occluded nondominant right coronary artery.   . Myocardial infarction     x5    1980s  sees DR. MICHAEL COOPER  . Anginal pain     NONE IN 1 MONTH   . Hypertension   . Stroke   . Depression   . Headache    Past Surgical History  Procedure Laterality Date  . Appendectomy    . Cardiac catheterization      3 STENTS  AT  [place]  80S OR 90S  . Coronary angioplasty    . Cholecystectomy      Family History  Problem Relation Age of Onset  . Heart failure     History  Substance Use Topics  . Smoking status: Former Smoker    Types: Cigarettes    Quit date: 04/23/1966  . Smokeless tobacco: Never Used  . Alcohol Use: No    Review of Systems ROS: Statement: All systems negative except as marked or noted in the HPI; Constitutional: Negative for fever and chills. ; ; Eyes: Negative for eye pain, redness and discharge. ; ; ENMT: Negative for ear pain, hoarseness, nasal congestion, sinus pressure and sore throat. ; ; Cardiovascular: Negative for chest pain, palpitations, diaphoresis, dyspnea and peripheral edema. ; ; Respiratory: Negative for cough, wheezing and stridor. ; ; Gastrointestinal: Negative for nausea, vomiting, diarrhea, abdominal pain, blood in stool, hematemesis, jaundice and rectal bleeding. . ; ; Genitourinary: +urinary retention. Negative for dysuria, flank pain and hematuria. ; ; Genital:  No penile drainage or rash, no testicular pain or swelling, no scrotal rash or swelling. ;; Musculoskeletal: Negative for back pain and neck pain. Negative for swelling and trauma.; ; Skin: Negative for pruritus, rash, abrasions, blisters, bruising and skin lesion.; ; Neuro: Negative for headache, lightheadedness and neck stiffness. Negative for weakness, altered level of consciousness , altered mental status, extremity weakness, paresthesias, involuntary movement, seizure and syncope.      Allergies  Aricept; Ativan; Atorvastatin; Namenda; Simvastatin; and Sorbitan  Home  Medications   Prior to Admission medications   Medication Sig Start Date End Date Taking? Authorizing Provider  amoxicillin (AMOXIL) 500 MG capsule Take 1 capsule (500 mg total) by mouth 3 (three) times daily. 10/22/14  Yes Diona Browner, DDS  ibuprofen (ADVIL,MOTRIN) 200 MG tablet Take 2 tablets (400 mg total) by mouth every 6 (six) hours as needed for headache. 03/06/14  Yes Rexene Alberts, MD  Ascorbic Acid  (VITAMIN C) 500 MG tablet Take 500 mg by mouth daily.      Historical Provider, MD  aspirin EC 81 MG tablet Take 2 tablets (162 mg total) by mouth daily. Patient taking differently: Take 81 mg by mouth daily.  03/06/14   Rexene Alberts, MD  cholecalciferol (D-VI-SOL) 400 UNIT/ML LIQD Take 400 Units by mouth at bedtime.     Historical Provider, MD  Coenzyme Q10 (COQ10) 100 MG CAPS Take 1 capsule by mouth daily.     Historical Provider, MD  Cyanocobalamin (VITAMIN B-12 PO) Take 1 tablet by mouth daily.    Historical Provider, MD  isosorbide mononitrate (IMDUR) 30 MG 24 hr tablet Take 0.5 tablets (15 mg total) by mouth daily. 07/12/14   Rhonda G Barrett, PA-C  meclizine (ANTIVERT) 12.5 MG tablet Take 1 tablet (12.5 mg total) by mouth 3 (three) times daily as needed for dizziness. 03/06/14   Rexene Alberts, MD  Melatonin 5 MG/15ML LIQD Take 5 mg by mouth at bedtime.    Historical Provider, MD  metoprolol (LOPRESSOR) 25 MG tablet Take 1 tablet (25 mg total) by mouth daily. Patient taking differently: Take 12.5 mg by mouth 2 (two) times daily.  07/12/14   Rhonda G Barrett, PA-C  NITROSTAT 0.4 MG SL tablet DISSOLVE ONE TABLET UNDER THE TONGUE EVERY 5 MINUTES AS NEEDED FOR CHEST PAIN 04/12/14   [name], MD  oxyCODONE-acetaminophen (PERCOCET) 5-325 MG per tablet Take 1-2 tablets by mouth every 4 (four) hours as needed for severe pain. 10/22/14   Diona Browner, DDS  Red Yeast Rice 600 MG CAPS Take 1 capsule by mouth every morning.     Historical Provider, MD   BP 120/53 mmHg  Pulse 79  Temp(Src) 98.4 F (36.9 C) (Oral)  Resp 20  Ht 5\' 9"  (1.753 m)  Wt 200 lb (90.719 kg)  BMI 29.52 kg/m2  SpO2 96% Physical Exam  1515: Physical examination:  Nursing notes reviewed; Vital signs and O2 SAT reviewed;  Constitutional: Well developed, Well nourished, Well hydrated, In no acute distress; Head:  Normocephalic, atraumatic; Eyes: EOMI, PERRL, No scleral icterus; ENMT: Mouth and pharynx normal, Mucous membranes  moist; Neck: Supple, Full range of motion, No lymphadenopathy; Cardiovascular: Regular rate and rhythm, No gallop; Respiratory: Breath sounds clear & equal bilaterally, No wheezes.  Speaking full sentences with ease, Normal respiratory effort/excursion; Chest: Nontender, Movement normal; Abdomen: Soft, Nontender, Nondistended, Normal bowel sounds; Genitourinary: No CVA tenderness. +foley draining clear/yellow urine.; Extremities: Pulses normal, No tenderness, No edema, No calf edema or asymmetry.; Neuro: AA&Ox3, HOH, Major CN grossly intact.  Speech clear. No gross focal motor or sensory deficits in extremities.; Skin: Color normal, Warm, Dry.   ED Course  Procedures     EKG Interpretation None      MDM  MDM Reviewed: previous chart, nursing note and vitals Interpretation: labs      Results for orders placed or performed during the hospital encounter of 10/23/14  Urinalysis, Routine w reflex microscopic (not at [facility])  Result Value Ref Range   Color, Urine YELLOW YELLOW  APPearance CLEAR CLEAR   Specific Gravity, Urine 1.010 1.005 - 1.030   pH 6.0 5.0 - 8.0   Glucose, UA NEGATIVE NEGATIVE mg/dL   Hgb urine dipstick TRACE (A) NEGATIVE   Bilirubin Urine NEGATIVE NEGATIVE   Ketones, ur NEGATIVE NEGATIVE mg/dL   Protein, ur NEGATIVE NEGATIVE mg/dL   Urobilinogen, UA 0.2 0.0 - 1.0 mg/dL   Nitrite NEGATIVE NEGATIVE   Leukocytes, UA NEGATIVE NEGATIVE  Urine microscopic-add on  Result Value Ref Range   Squamous Epithelial / LPF FEW (A) RARE   WBC, UA 0-2 <3 WBC/hpf   RBC / HPF 0-2 <3 RBC/hpf   Bacteria, UA FEW (A) RARE    1600:  Pt's wife states pt's symptoms began after pt had anesthesia yesterday for multiple dental extractions. ED RN performed bladder scan on pt's arrival to ED: 978ml urine in bladder. Foley was then placed. 1085ml urine output while in the ED.  No clear UTI on Udip; UC is pending. VSS, abd soft/NT. Pt states he feels much better after foley placement and wants  to go home now. Will keep foley in place, f/u Uro MD this week. Dx and testing d/w pt and family.  Questions answered.  Verb understanding, agreeable to d/c home with outpt f/u.     Francine Graven, DO 10/27/14 1030

## 2014-10-23 NOTE — ED Notes (Addendum)
Pt spouse reports pt has not been able to urinate "but a few drops" since this am. Pt denies any fever,dysuria.

## 2014-10-23 NOTE — ED Notes (Signed)
Bladder scanner shows 999 cc of urine in pt's bladder

## 2014-10-23 NOTE — ED Notes (Signed)
Pt states that he feels much better at this time.

## 2014-10-23 NOTE — Discharge Instructions (Signed)
°Emergency Department Resource Guide °1) Find a Doctor and Pay Out of Pocket °Although you won't have to find out who is covered by your insurance plan, it is a good idea to ask around and get recommendations. You will then need to call the office and see if the doctor you have chosen will accept you as a new patient and what types of options they offer for patients who are self-pay. Some doctors offer discounts or will set up payment plans for their patients who do not have insurance, but you will need to ask so you aren't surprised when you get to your appointment. ° °2) Contact Your Local Health Department °Not all health departments have doctors that can see patients for sick visits, but many do, so it is worth a call to see if yours does. If you don't know where your local health department is, you can check in your phone book. The CDC also has a tool to help you locate your state's health department, and many state websites also have listings of all of their local health departments. ° °3) Find a Walk-in Clinic °If your illness is not likely to be very severe or complicated, you may want to try a walk in clinic. These are popping up all over the country in pharmacies, drugstores, and shopping centers. They're usually staffed by nurse practitioners or physician assistants that have been trained to treat common illnesses and complaints. They're usually fairly quick and inexpensive. However, if you have serious medical issues or chronic medical problems, these are probably not your best option. ° °No Primary Care Doctor: °- Call [facility] at  832-8000 - they can help you locate a primary care doctor that  accepts your insurance, provides certain services, etc. °- Physician Referral Service- 1-800-533-3463 ° °Chronic Pain Problems: °Organization         Address  Phone   Notes  °[facility]  (336) [phone] Patients need to be referred by their primary care doctor.  ° °Medication  Assistance: °Organization         Address  Phone   Notes  °[place] [facility] [address] °[place], [place] [address] (336) 641-8030 --Must be a resident of [place] °-- Must have NO insurance coverage whatsoever (no Medicaid/ Medicare, etc.) °-- The pt. MUST have a primary care doctor that directs their care regularly and follows them in the community °  °[facility]  (866) 331-1348   °[employer]  (888) 892-1162   ° °Agencies that provide inexpensive medical care: °Organization         Address  Phone   Notes  °[facility]  (336) 832-8035   °[facility]    (336) 832-7272   °[facility] [address] °[place], [place] [address] (336) 832-4777   °[facility] [address], °[place] (336) 271-4999   °[facility]    (336) 373-0678   °[facility]    (336) 272-1050   °[facility] ° [address], [place] Phone:  (336) 832-4444, Fax:  (336) 832-4440 Hours of Operation:  9 am - 6 pm, M-F.  Also accepts Medicaid/Medicare and self-pay.  °[facility] ° [address], [place] Phone: (336) 832-3150, Fax: (336) [phone]. Hours of Operation:  8:30 am - 5:30 pm, M-F.  Also accepts Medicaid and self-pay.  °[facility] [place] [address], [place] Phone: (336) 878-6027   °[facility] [address], [place], [place] (336)723-1848, Ext. 123 Mondays & Thursdays: 7-9 AM.  First 15 patients are seen on a first come, first serve basis. °  ° °Medicaid-accepting [place] Providers: ° °Organization         Address  Phone   Notes  °[facility] [address], [place] (336) 641-2100 Also accepts self-pay patients.  °[facility] [address], [place] ° (336) 856-9996   °[facility] [address], [place]  (336) 288-8857   °[facility] [address] [place] [address], [place] (336) 299-7000   °Veita Bland [address], [place]  ° (336) 373-1557 Only accepts [place] Access Medicaid patients after they have their name applied to their card.  ° °Self-Pay (no insurance) in [place]: ° °Organization         Address  Phone   Notes  °Sickle Cell Patients, [facility] [address], [place] (336) 832-1970   °[facility] [address], [place] (336) 832-4400   °[facility] ° [address], [place] (336) 992-4800   °[facility]/Dr. Osei-Bonsu ° [address] [place] [address], [place] or [address], [place] (336) 841-8500 Phone number for both [place] and [place] locations is the same.  °[facility] [address], [place] (336) 299-0000   °[facility] [address] [place] [address], [place] or [address] (336) 852-7530 °(336) 878-2260   °[facility] [address], [place] (336) 350-1642, phone; (336) 294-5005, fax Sees patients 1st and 3rd Saturday of every month.  Must not qualify for public or private insurance (i.e. Medicaid, Medicare, [place] Health Choice, Veterans' Benefits) • Household income should be no more than 200% of the poverty level •The clinic cannot treat you if you are pregnant or think you are pregnant • Sexually transmitted diseases are not treated at the clinic.  ° ° °Dental Care: °Organization         Address  Phone  Notes  °[place] [facility] [address], [place] (336) 641-6152 Accepts children up to age 21 who are enrolled in Medicaid or [place] Health Choice; pregnant women with a Medicaid card; and children who have applied for Medicaid or [place] Health Choice, but were declined, whose parents can pay a reduced fee at time of service.  °[place]  [facility] [place]  [address], [place] (336) 641-7733 Accepts children up to age 21 who are enrolled in Medicaid or [place] Health Choice; pregnant women with a Medicaid card; and children who have applied for Medicaid or [place] Health Choice, but were declined, whose parents can pay a reduced fee at time of service.  °[facility] ° [address], [place] (336) 641-4533 Patients are seen by appointment only. Walk-ins are not accepted. [facility] will see patients 18 years of age and older. °Monday - Tuesday (8am-5pm) °Most Wednesdays (8:30-5pm) °$30 per visit, cash only  °[facility] ° [address], [place] (336) 641-4533 Patients are seen by appointment only. Walk-ins are not accepted. [facility] will see patients 18 years of age and older. °One   Wednesday Evening (Monthly: Volunteer Based).  $30 per visit, cash only  °[facility]  (919) 537-3737 for adults; Children under age 4, call Graduate Pediatric Dentistry at ([phone]) [phone]. Children aged 4-14, please call ([phone]) [phone] to request a pediatric application. ° Dental services are provided in all areas of dental care including fillings, crowns and bridges, complete and partial dentures, implants, gum treatment, root canals, and extractions. Preventive care is also provided. Treatment is provided to both adults and children. °Patients are selected via a lottery and there is often a waiting list. °  °[facility] [address], °[place] ° (336) 763-8833 [email or web address] °  °[facility] [address], [place], [place] (336)723-1848, Ext. 123 Second and Fourth Thursday of each month, opens at 6:30 AM; Clinic ends at 9 AM.  Patients are seen on a first-come first-served basis, and a limited number are seen during each clinic.  ° °[facility] ° [address], [place], [place] (336) 723-7904    Eligibility Requirements °You must have lived in [place], or [place] counties for at least the last three months. °  You cannot be eligible for state or federal sponsored healthcare insurance, including Veterans Administration, Medicaid, or Medicare. °  You generally cannot be eligible for healthcare insurance through your employer.  °  How to apply: °Eligibility screenings are held every Tuesday and Wednesday afternoon from 1:00 pm until 4:00 pm. You do not need an appointment for the interview!  °[facility] [address], [place], [place] 336-631-2330   °[facility]  336-342-8273   °[facility]  336-703-3100   °[facility]  336-570-6415   ° °Behavioral Health Resources in the Community: °Intensive Outpatient Programs °Organization         Address  Phone  Notes  °[facility] [address], [place], [place] 336-878-6098   °[facility] [address], [place], [place] 336-832-9800   °[facility] [address], [place], [place] ° 336-882-2125   °[facility] [address],  °[place], [place] 1-800-853-5163 or 336-641-4981   °Substance Abuse Resources °Organization         Address  Phone  Notes  °[facility]  336-882-2125   °[facility]  336-784-9470   °[facility]  336-285-9073   °[facility]  336-845-3988   °Residential & Outpatient Substance Abuse Program  1-800-659-3381   °Psychological Services °Organization         Address  Phone  Notes  °[facility]  336- 832-9600   °[facility]  336- 378-7881   °[facility] [address], [place] 1-800-853-5163 or 336-641-4981   ° °Mobile Crisis Teams °Organization         Address  Phone  Notes  °[facility], Mobile Crisis Care Unit  1-877-626-1772   °Assertive °[facility] ° [address]  [place], [place] 336-834-9664   °Sharon DeEsch [address] °[place] 336-554-5454   ° °Self-Help/Support Groups °Organization         Address  Phone             Notes  °[facility] - variety of support groups  336- 373-1402 Call for more information  °Narcotics Anonymous (NA), [facility] [address], °[place]   2 meetings at this location  ° °  Residential Treatment Programs Organization         Address  Phone  Notes  [facility] [address]    [place]  1-9130958437   [facility]  [address], [place] [address], [place], [place]   [place], [place] [phone] Admissions: 8am-3pm M-F  [facility] [place] [address]    [place], [place] 650-354-6568   [facility] [address] [place], [place], [place]   [facility] [address]  [place], [place]   [facility] Intensive Outpatient [address], [place], [place]   [facility] [address] [place]  [place], [place] 1-601-283-6189 or 209-671-0779   [facility] [address] [place], [place] Accepts Medicaid  [facility] [place] [address],  [place] 1-(510) 166-0136 Substance Abuse/Addiction Treatment   [facility] Organization         Address  Phone  Notes  [facility]  828-459-0017   Domenic Schwab, PhD [address] [place], [place]   604-575-1222 or 815-622-0475   [place], [place] 403-078-5257   [facility] [address] [place], [place] [phone] Insurance/Medicaid/sponsorship through [facility] [address]                                    [place], [place] [phone] [place] [address], [place] ([phone]    Dr. Adele Schilder  ([phone])[phone]   [facility] Dept. 1) [address] [place] 2) [address], [place] 3)  [address], [place] [phone]  [phone]   [place] [phone] or [phone] (After Hours)      Keep the foley catheter in place until you are seen in follow up by the Urologist. Call the Urologist on Monday to schedule a follow up appointment within the next 3 days.  Return to the Emergency Department immediately sooner if worsening.

## 2014-10-24 ENCOUNTER — Emergency Department (HOSPITAL_COMMUNITY)
Admission: EM | Admit: 2014-10-24 | Discharge: 2014-10-24 | Disposition: A | Discharge status: Home / Self Care | Payer: Medicare Other | Attending: Emergency Medicine | Admitting: Emergency Medicine

## 2014-10-24 ENCOUNTER — Encounter (HOSPITAL_COMMUNITY): Payer: Self-pay | Admitting: Emergency Medicine

## 2014-10-24 DIAGNOSIS — Z7982 Long term (current) use of aspirin: Secondary | ICD-10-CM | POA: Insufficient documentation

## 2014-10-24 DIAGNOSIS — I129 Hypertensive chronic kidney disease with stage 1 through stage 4 chronic kidney disease, or unspecified chronic kidney disease: Secondary | ICD-10-CM | POA: Diagnosis not present

## 2014-10-24 DIAGNOSIS — X58XXXA Exposure to other specified factors, initial encounter: Secondary | ICD-10-CM | POA: Insufficient documentation

## 2014-10-24 DIAGNOSIS — Z9889 Other specified postprocedural states: Secondary | ICD-10-CM | POA: Insufficient documentation

## 2014-10-24 DIAGNOSIS — I252 Old myocardial infarction: Secondary | ICD-10-CM | POA: Diagnosis not present

## 2014-10-24 DIAGNOSIS — F0280 Dementia in other diseases classified elsewhere without behavioral disturbance: Secondary | ICD-10-CM | POA: Diagnosis not present

## 2014-10-24 DIAGNOSIS — M1990 Unspecified osteoarthritis, unspecified site: Secondary | ICD-10-CM | POA: Insufficient documentation

## 2014-10-24 DIAGNOSIS — T8351XA Infection and inflammatory reaction due to indwelling urinary catheter, initial encounter: Secondary | ICD-10-CM | POA: Diagnosis not present

## 2014-10-24 DIAGNOSIS — G309 Alzheimer's disease, unspecified: Secondary | ICD-10-CM | POA: Insufficient documentation

## 2014-10-24 DIAGNOSIS — Z8659 Personal history of other mental and behavioral disorders: Secondary | ICD-10-CM | POA: Diagnosis not present

## 2014-10-24 DIAGNOSIS — Z792 Long term (current) use of antibiotics: Secondary | ICD-10-CM | POA: Insufficient documentation

## 2014-10-24 DIAGNOSIS — E785 Hyperlipidemia, unspecified: Secondary | ICD-10-CM | POA: Insufficient documentation

## 2014-10-24 DIAGNOSIS — R339 Retention of urine, unspecified: Secondary | ICD-10-CM

## 2014-10-24 DIAGNOSIS — Y998 Other external cause status: Secondary | ICD-10-CM | POA: Diagnosis not present

## 2014-10-24 DIAGNOSIS — Z79899 Other long term (current) drug therapy: Secondary | ICD-10-CM | POA: Insufficient documentation

## 2014-10-24 DIAGNOSIS — S3730XA Unspecified injury of urethra, initial encounter: Secondary | ICD-10-CM | POA: Insufficient documentation

## 2014-10-24 DIAGNOSIS — Z9861 Coronary angioplasty status: Secondary | ICD-10-CM | POA: Diagnosis not present

## 2014-10-24 DIAGNOSIS — Z8673 Personal history of transient ischemic attack (TIA), and cerebral infarction without residual deficits: Secondary | ICD-10-CM | POA: Insufficient documentation

## 2014-10-24 DIAGNOSIS — N183 Chronic kidney disease, stage 3 (moderate): Secondary | ICD-10-CM | POA: Diagnosis not present

## 2014-10-24 DIAGNOSIS — T839XXA Unspecified complication of genitourinary prosthetic device, implant and graft, initial encounter: Secondary | ICD-10-CM

## 2014-10-24 DIAGNOSIS — T83028A Displacement of other indwelling urethral catheter, initial encounter: Secondary | ICD-10-CM | POA: Insufficient documentation

## 2014-10-24 DIAGNOSIS — Z87891 Personal history of nicotine dependence: Secondary | ICD-10-CM | POA: Diagnosis not present

## 2014-10-24 DIAGNOSIS — I25119 Atherosclerotic heart disease of native coronary artery with unspecified angina pectoris: Secondary | ICD-10-CM | POA: Diagnosis not present

## 2014-10-24 DIAGNOSIS — T83198A Other mechanical complication of other urinary devices and implants, initial encounter: Secondary | ICD-10-CM | POA: Diagnosis not present

## 2014-10-24 DIAGNOSIS — Y9389 Activity, other specified: Secondary | ICD-10-CM | POA: Diagnosis not present

## 2014-10-24 DIAGNOSIS — Y92009 Unspecified place in unspecified non-institutional (private) residence as the place of occurrence of the external cause: Secondary | ICD-10-CM | POA: Insufficient documentation

## 2014-10-24 MED ORDER — LIDOCAINE HCL 2 % EX GEL
1.0000 "application " | Freq: Once | CUTANEOUS | Status: AC
  Administered 2014-10-24: 1 via URETHRAL

## 2014-10-24 MED ORDER — LIDOCAINE HCL 2 % EX GEL
CUTANEOUS | Status: AC
  Filled 2014-10-24: qty 30

## 2014-10-24 MED ORDER — SODIUM CHLORIDE 0.9 % IV BOLUS (SEPSIS)
1000.0000 mL | Freq: Once | INTRAVENOUS | Status: AC
  Administered 2014-10-24: 1000 mL via INTRAVENOUS

## 2014-10-24 NOTE — ED Notes (Signed)
Patient's daughter is on phone with EDP with questions about patient's discharge.

## 2014-10-24 NOTE — Discharge Instructions (Signed)
Follow-up in 5-7 days with the urologist as previously instructed.  Foley Catheter Care A Foley catheter is a soft, flexible tube that is placed into the bladder to drain urine. A Foley catheter may be inserted if:  You leak urine or are not able to control when you urinate (urinary incontinence).  You are not able to urinate when you need to (urinary retention).  You had prostate surgery or surgery on the genitals.  You have certain medical conditions, such as multiple sclerosis, dementia, or a spinal cord injury. If you are going home with a Foley catheter in place, follow the instructions below. TAKING CARE OF THE CATHETER 1. Wash your hands with soap and water. 2. Using mild soap and warm water on a clean washcloth:  Clean the area on your body closest to the catheter insertion site using a circular motion, moving away from the catheter. Never wipe toward the catheter because this could sweep bacteria up into the urethra and cause infection.  Remove all traces of soap. Pat the area dry with a clean towel. For males, reposition the foreskin. 3. Attach the catheter to your leg so there is no tension on the catheter. Use adhesive tape or a leg strap. If you are using adhesive tape, remove any sticky residue left behind by the previous tape you used. 4. Keep the drainage bag below the level of the bladder, but keep it off the floor. 5. Check throughout the day to be sure the catheter is working and urine is draining freely. Make sure the tubing does not become kinked. 6. Do not pull on the catheter or try to remove it. Pulling could damage internal tissues. TAKING CARE OF THE DRAINAGE BAGS You will be given two drainage bags to take home. One is a large overnight drainage bag, and the other is a smaller leg bag that fits underneath clothing. You may wear the overnight bag at any time, but you should never wear the smaller leg bag at night. Follow the instructions below for how to empty,  change, and clean your drainage bags. Emptying the Drainage Bag You must empty your drainage bag when it is  - full or at least 2-3 times a day. 1. Wash your hands with soap and water. 2. Keep the drainage bag below your hips, below the level of your bladder. This stops urine from going back into the tubing and into your bladder. 3. Hold the dirty bag over the toilet or a clean container. 4. Open the pour spout at the bottom of the bag and empty the urine into the toilet or container. Do not let the pour spout touch the toilet, container, or any other surface. Doing so can place bacteria on the bag, which can cause an infection. 5. Clean the pour spout with a gauze pad or cotton ball that has rubbing alcohol on it. 6. Close the pour spout. 7. Attach the bag to your leg with adhesive tape or a leg strap. 8. Wash your hands well. Changing the Drainage Bag Change your drainage bag once a month or sooner if it starts to smell bad or look dirty. Below are steps to follow when changing the drainage bag. 1. Wash your hands with soap and water. 2. Pinch off the rubber catheter so that urine does not spill out. 3. Disconnect the catheter tube from the drainage tube at the connection valve. Do not let the tubes touch any surface. 4. Clean the end of the catheter tube with  an alcohol wipe. Use a different alcohol wipe to clean the end of the drainage tube. 5. Connect the catheter tube to the drainage tube of the clean drainage bag. 6. Attach the new bag to the leg with adhesive tape or a leg strap. Avoid attaching the new bag too tightly. 7. Wash your hands well. Cleaning the Drainage Bag 1. Wash your hands with soap and water. 2. Wash the bag in warm, soapy water. 3. Rinse the bag thoroughly with warm water. 4. Fill the bag with a solution of white vinegar and water (1 cup vinegar to 1 qt warm water [.2 L vinegar to 1 L warm water]). Close the bag and soak it for 30 minutes in the solution. 5. Rinse  the bag with warm water. 6. Hang the bag to dry with the pour spout open and hanging downward. 7. Store the clean bag (once it is dry) in a clean plastic bag. 8. Wash your hands well. PREVENTING INFECTION  Wash your hands before and after handling your catheter.  Take showers daily and wash the area where the catheter enters your body. Do not take baths. Replace wet leg straps with dry ones, if this applies.  Do not use powders, sprays, or lotions on the genital area. Only use creams, lotions, or ointments as directed by your caregiver.  For females, wipe from front to back after each bowel movement.  Drink enough fluids to keep your urine clear or pale yellow unless you have a fluid restriction.  Do not let the drainage bag or tubing touch or lie on the floor.  Wear cotton underwear to absorb moisture and to keep your skin drier. SEEK MEDICAL CARE IF:   Your urine is cloudy or smells unusually bad.  Your catheter becomes clogged.  You are not draining urine into the bag or your bladder feels full.  Your catheter starts to leak. SEEK IMMEDIATE MEDICAL CARE IF:   You have pain, swelling, redness, or pus where the catheter enters the body.  You have pain in the abdomen, legs, lower back, or bladder.  You have a fever.  You see blood fill the catheter, or your urine is pink or red.  You have nausea, vomiting, or chills.  Your catheter gets pulled out. MAKE SURE YOU:   Understand these instructions.  Will watch your condition.  Will get help right away if you are not doing well or get worse. Document Released: 04/09/2005 Document Revised: 08/24/2013 Document Reviewed: 03/31/2012 [facility] [place] [facility] Patient Information 2015 [place], [place]. This information is not intended to replace advice given to you by your health care provider. Make sure you discuss any questions you have with your health care provider.  Acute Urinary Retention Acute urinary retention is the temporary  inability to urinate. This is a common problem in older men. As men age their prostates become larger and block the flow of urine from the bladder. This is usually a problem that has come on gradually.  HOME CARE INSTRUCTIONS If you are sent home with a Foley catheter and a drainage system, you will need to discuss the best course of action with your health care provider. While the catheter is in, maintain a good intake of fluids. Keep the drainage bag emptied and lower than your catheter. This is so that contaminated urine will not flow back into your bladder, which could lead to a urinary tract infection. There are two main types of drainage bags. One is a large bag that usually is  used at night. It has a good capacity that will allow you to sleep through the night without having to empty it. The second type is called a leg bag. It has a smaller capacity, so it needs to be emptied more frequently. However, the main advantage is that it can be attached by a leg strap and can go underneath your clothing, allowing you the freedom to move about or leave your home. Only take over-the-counter or prescription medicines for pain, discomfort, or fever as directed by your health care provider.  SEEK MEDICAL CARE IF:  You develop a low-grade fever.  You experience spasms or leakage of urine with the spasms. SEEK IMMEDIATE MEDICAL CARE IF:  7. You develop chills or fever. 8. Your catheter stops draining urine. 9. Your catheter falls out. 10. You start to develop increased bleeding that does not respond to rest and increased fluid intake. MAKE SURE YOU: 9. Understand these instructions. 10. Will watch your condition. 11. Will get help right away if you are not doing well or get worse. Document Released: 07/16/2000 Document Revised: 04/14/2013 Document Reviewed: 09/18/2012 [facility] Patient Information 2015 [place], [place]. This information is not intended to replace advice given to you by your health care  provider. Make sure you discuss any questions you have with your health care provider.

## 2014-10-24 NOTE — ED Notes (Signed)
Family calling patient's wife to find out if they want a leg bag or a regualr foley bag.

## 2014-10-24 NOTE — ED Notes (Addendum)
Foley irrigated again before discharge. Foley is patent with cherry red drainage.

## 2014-10-24 NOTE — ED Notes (Signed)
Leg bag applied, family instructed how to drain leg bag and given a urinal to drain urine into. Patient instructed not to pull on catheter and cath secure applied to leg.

## 2014-10-24 NOTE — ED Provider Notes (Signed)
CSN: [id number]     Arrival date & time 10/24/14  0331 History   First MD Initiated Contact with Patient 10/24/14 0340     Chief Complaint  Patient presents with  . Urinary Retention     (Consider location/radiation/quality/duration/timing/severity/associated sxs/prior Treatment) HPI  This is an 81 year old male who presents with a Foley catheter complication. Patient had a Foley catheter placed yesterday afternoon for urinary retention following anesthesia. Patient was at home and he ate at his Foley catheter. There was notable blood.  Patient has a history of dementia and is unable to provide details regarding the trauma. He is reporting pain.  Family is at the bedside.  Level V caveat for dementia.  Past Medical History  Diagnosis Date  . Hyperlipidemia   . Coronary atherosclerosis of native coronary artery     BMS circumflex and OM 2006, PTCA ISR 2010, moderate LAD disease  . DJD (degenerative joint disease)   . Macular degeneration   . Alzheimer's dementia   . CKD (chronic kidney disease), stage III   . History of stroke     Seen radiographically-left PICA; unknown to patient.  Marland Kitchen TIA (transient ischemic attack) 03/05/2014  . 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 CAD (coronary artery disease), native coronary artery     Initially diagnosed 2003 2006 catheterization with bare metal stenting of the first marginal branch of the circumflex and continuation branch of the circumflex 2010 catheterization showing normal left main, 50-60% LAD unchanged from before, proximal stent of marginal patent, severe in-stent restenosis treated with cutting balloon angioplasty, patent stent in continuation branch with mild in-stent restenosis, occluded nondominant right coronary artery.   . Myocardial infarction     x5    1980s  sees DR. MICHAEL COOPER  . Anginal pain     NONE IN 1 MONTH   . Hypertension   . Stroke   . Depression   . Headache    Past Surgical History   Procedure Laterality Date  . Appendectomy    . Cardiac catheterization      3 STENTS  AT  [place]  80S OR 90S  . Coronary angioplasty    . Cholecystectomy     Family History  Problem Relation Age of Onset  . Heart failure     History  Substance Use Topics  . Smoking status: Former Smoker    Types: Cigarettes    Quit date: 04/23/1966  . Smokeless tobacco: Never Used  . Alcohol Use: No    Review of Systems  Unable to perform ROS: Dementia      Allergies  Aricept; Ativan; Atorvastatin; Namenda; Simvastatin; and Sorbitan  Home Medications   Prior to Admission medications   Medication Sig Start Date End Date Taking? Authorizing Provider  amoxicillin (AMOXIL) 500 MG capsule Take 1 capsule (500 mg total) by mouth 3 (three) times daily. 10/22/14   Diona Browner, DDS  Ascorbic Acid (VITAMIN C) 500 MG tablet Take 500 mg by mouth daily.      Historical Provider, MD  aspirin EC 81 MG tablet Take 2 tablets (162 mg total) by mouth daily. Patient taking differently: Take 81 mg by mouth daily.  03/06/14   Rexene Alberts, MD  cholecalciferol (D-VI-SOL) 400 UNIT/ML LIQD Take 400 Units by mouth at bedtime.     Historical Provider, MD  Coenzyme Q10 (COQ10) 100 MG CAPS Take 1 capsule by mouth daily.     Historical Provider, MD  Cyanocobalamin (VITAMIN B-12 PO) Take 1 tablet by mouth  daily.    Historical Provider, MD  ibuprofen (ADVIL,MOTRIN) 200 MG tablet Take 2 tablets (400 mg total) by mouth every 6 (six) hours as needed for headache. 03/06/14   Rexene Alberts, MD  isosorbide mononitrate (IMDUR) 30 MG 24 hr tablet Take 0.5 tablets (15 mg total) by mouth daily. 07/12/14   Rhonda G Barrett, PA-C  meclizine (ANTIVERT) 12.5 MG tablet Take 1 tablet (12.5 mg total) by mouth 3 (three) times daily as needed for dizziness. 03/06/14   Rexene Alberts, MD  Melatonin 5 MG/15ML LIQD Take 5 mg by mouth at bedtime.    Historical Provider, MD  metoprolol (LOPRESSOR) 25 MG tablet Take 1 tablet (25 mg total) by  mouth daily. Patient taking differently: Take 12.5 mg by mouth 2 (two) times daily.  07/12/14   Rhonda G Barrett, PA-C  NITROSTAT 0.4 MG SL tablet DISSOLVE ONE TABLET UNDER THE TONGUE EVERY 5 MINUTES AS NEEDED FOR CHEST PAIN 04/12/14   [name], MD  oxyCODONE-acetaminophen (PERCOCET) 5-325 MG per tablet Take 1-2 tablets by mouth every 4 (four) hours as needed for severe pain. 10/22/14   Diona Browner, DDS  Red Yeast Rice 600 MG CAPS Take 1 capsule by mouth every morning.     Historical Provider, MD   BP 137/77 mmHg  Pulse 97  Temp(Src) 98 F (36.7 C) (Oral)  Resp 20  SpO2 95% Physical Exam  Constitutional: He appears well-developed and well-nourished. No distress.  HENT:  Head: Normocephalic and atraumatic.  Cardiovascular: Normal rate and regular rhythm.   Pulmonary/Chest: Effort normal. No respiratory distress.  Genitourinary:  Normal circumcised penis, gross blood at the urethral meatus, blood clots and approximately 20 mL of blood noted following Foley removal  Musculoskeletal: He exhibits no edema.  Neurological: He is alert.  Skin: Skin is warm and dry.  Psychiatric: He has a normal mood and affect.  Nursing note and vitals reviewed.   ED Course  Procedures (including critical care time)  EMERGENCY DEPARTMENT US BLADDER  INDICATIONS:  Foley trauma and confirmation of Foley placement   PERFORMED BY: Myself  IMAGES ARCHIVED?: Yes  FINDINGS: Foley bulb noted and full urinary bladder  LIMITATIONS:  Body habitus  INTERPRETATION:  Foley catheter in appropriate placement  COMMENT:  Foley catheter was placed following urethral trauma from prior Foley dislodgment. After placement of Foley without difficulty, there was no return of urine. Ultrasound was performed to confirm appropriate positioning.  Labs Review Labs Reviewed - No data to display  Imaging Review No results found.   EKG Interpretation None      MDM   Final diagnoses:  Complication of Foley  catheter, initial encounter  Urinary retention    Patient presents with urethral trauma after Foley was dislodged. He had approximately 20 mL of gross blood and clot at the urethral meatus. An attempt to pass a second Foley was unsuccessful by nursing and they were meeting resistance. Given concern for urethral trauma, urology was consulted. I discussed the patient with Dr. Alinda Money. Given that his urinary retention was post anesthesia, will give patient a voiding trial. Patient was given fluids. Patient reported urge but was unable to void. Bladder scan shows 748 mL of urine. I was able to pass a 16 [place] coud catheter without difficulty. Initially it did not drain. Catheter placement was confirmed by ultrasound. Catheter was flushed with return of bloody urine. Patient to follow-up with urology in 5-7 days. Patient's family updated.  After history, exam, and medical workup I feel the  patient has been appropriately medically screened and is safe for discharge home. Pertinent diagnoses were discussed with the patient. Patient was given return precautions.     Merryl Hacker, MD 10/24/14 [phone]

## 2014-10-24 NOTE — ED Notes (Signed)
Patient tugged on urinary catheter and displaced catheter, has blood in foley bag.

## 2014-10-25 LAB — URINE CULTURE: Culture: NO GROWTH

## 2014-10-26 ENCOUNTER — Encounter (HOSPITAL_COMMUNITY): Payer: Self-pay | Admitting: Oral Surgery

## 2014-11-01 DIAGNOSIS — F0390 Unspecified dementia without behavioral disturbance: Secondary | ICD-10-CM | POA: Diagnosis not present

## 2014-11-01 DIAGNOSIS — R451 Restlessness and agitation: Secondary | ICD-10-CM | POA: Diagnosis not present

## 2014-11-08 ENCOUNTER — Emergency Department (HOSPITAL_COMMUNITY): Payer: Medicare Other

## 2014-11-08 ENCOUNTER — Encounter (HOSPITAL_COMMUNITY): Payer: Self-pay | Admitting: Emergency Medicine

## 2014-11-08 ENCOUNTER — Emergency Department (HOSPITAL_COMMUNITY)
Admission: EM | Admit: 2014-11-08 | Discharge: 2014-11-08 | Discharge status: Against Medical Advice | Payer: Medicare Other | Attending: Emergency Medicine | Admitting: Emergency Medicine

## 2014-11-08 DIAGNOSIS — I129 Hypertensive chronic kidney disease with stage 1 through stage 4 chronic kidney disease, or unspecified chronic kidney disease: Secondary | ICD-10-CM | POA: Insufficient documentation

## 2014-11-08 DIAGNOSIS — I25119 Atherosclerotic heart disease of native coronary artery with unspecified angina pectoris: Secondary | ICD-10-CM | POA: Diagnosis not present

## 2014-11-08 DIAGNOSIS — I252 Old myocardial infarction: Secondary | ICD-10-CM | POA: Diagnosis not present

## 2014-11-08 DIAGNOSIS — N183 Chronic kidney disease, stage 3 (moderate): Secondary | ICD-10-CM | POA: Insufficient documentation

## 2014-11-08 DIAGNOSIS — R4182 Altered mental status, unspecified: Secondary | ICD-10-CM | POA: Diagnosis not present

## 2014-11-08 DIAGNOSIS — R079 Chest pain, unspecified: Secondary | ICD-10-CM | POA: Insufficient documentation

## 2014-11-08 LAB — BASIC METABOLIC PANEL
Anion gap: 9 (ref 5–15)
BUN: 20 mg/dL (ref 6–20)
CO2: 26 mmol/L (ref 22–32)
CREATININE: 1.6 mg/dL — AB (ref 0.61–1.24)
Calcium: 8.6 mg/dL — ABNORMAL LOW (ref 8.9–10.3)
Chloride: 103 mmol/L (ref 101–111)
GFR calc Af Amer: 44 mL/min — ABNORMAL LOW (ref >60)
GFR calc non Af Amer: 38 mL/min — ABNORMAL LOW (ref >60)
GLUCOSE: 98 mg/dL (ref 65–99)
POTASSIUM: 5.1 mmol/L (ref 3.5–5.1)
Sodium: 138 mmol/L (ref 135–145)

## 2014-11-08 LAB — CBC
HEMATOCRIT: 41.1 % (ref 39.0–52.0)
HEMOGLOBIN: 13.2 g/dL (ref 13.0–17.0)
MCH: 30.5 pg (ref 26.0–34.0)
MCHC: 32.1 g/dL (ref 30.0–36.0)
MCV: 94.9 fL (ref 78.0–100.0)
PLATELETS: 256 10*3/uL (ref 150–400)
RBC: 4.33 MIL/uL (ref 4.22–5.81)
RDW: 13.1 % (ref 11.5–15.5)
WBC: 8 10*3/uL (ref 4.0–10.5)

## 2014-11-08 LAB — TROPONIN I: Troponin I: <0.03 ng/mL (ref <0.031)

## 2014-11-08 NOTE — ED Notes (Signed)
Wife reports pt was seen at the [place] clinic this am for altered mental status and aggressive behaviors for the past few days. Wife states pt has dementia with worsening at night but today was very aggressive with her at home. PT alert with confusion in triage with no behaviors noted.

## 2014-11-08 NOTE — ED Notes (Signed)
PT now c/o central chest pain starting today and denies any SOB.

## 2014-11-17 DIAGNOSIS — M1712 Unilateral primary osteoarthritis, left knee: Secondary | ICD-10-CM | POA: Diagnosis not present

## 2014-11-17 DIAGNOSIS — Z6831 Body mass index (BMI) 31.0-31.9, adult: Secondary | ICD-10-CM | POA: Diagnosis not present

## 2014-11-17 DIAGNOSIS — M25562 Pain in left knee: Secondary | ICD-10-CM | POA: Diagnosis not present

## 2014-11-17 DIAGNOSIS — I10 Essential (primary) hypertension: Secondary | ICD-10-CM | POA: Diagnosis not present

## 2014-11-17 DIAGNOSIS — M179 Osteoarthritis of knee, unspecified: Secondary | ICD-10-CM | POA: Diagnosis not present

## 2014-11-23 DIAGNOSIS — R451 Restlessness and agitation: Secondary | ICD-10-CM | POA: Diagnosis not present

## 2014-11-23 DIAGNOSIS — F0390 Unspecified dementia without behavioral disturbance: Secondary | ICD-10-CM | POA: Diagnosis not present

## 2014-11-24 ENCOUNTER — Encounter (HOSPITAL_COMMUNITY): Payer: Self-pay | Admitting: Emergency Medicine

## 2014-11-24 ENCOUNTER — Emergency Department (HOSPITAL_COMMUNITY)
Admission: EM | Admit: 2014-11-24 | Discharge: 2014-11-24 | Discharge status: Against Medical Advice | Payer: Medicare Other | Attending: Emergency Medicine | Admitting: Emergency Medicine

## 2014-11-24 DIAGNOSIS — I2510 Atherosclerotic heart disease of native coronary artery without angina pectoris: Secondary | ICD-10-CM | POA: Diagnosis not present

## 2014-11-24 DIAGNOSIS — R4182 Altered mental status, unspecified: Secondary | ICD-10-CM | POA: Insufficient documentation

## 2014-11-24 DIAGNOSIS — I129 Hypertensive chronic kidney disease with stage 1 through stage 4 chronic kidney disease, or unspecified chronic kidney disease: Secondary | ICD-10-CM | POA: Diagnosis not present

## 2014-11-24 DIAGNOSIS — I252 Old myocardial infarction: Secondary | ICD-10-CM | POA: Insufficient documentation

## 2014-11-24 DIAGNOSIS — G309 Alzheimer's disease, unspecified: Secondary | ICD-10-CM | POA: Insufficient documentation

## 2014-11-24 DIAGNOSIS — N183 Chronic kidney disease, stage 3 (moderate): Secondary | ICD-10-CM | POA: Insufficient documentation

## 2014-11-24 NOTE — ED Notes (Addendum)
Pt spouse reports increased mood swings/wandering, intermittent chest pain for last several days. Pt spouse reports the pt has been seen for same at pcp and "is given a pill each time." pt spouse reports pt was seen here for same x3 weeks ago.

## 2014-12-01 DIAGNOSIS — M25562 Pain in left knee: Secondary | ICD-10-CM | POA: Diagnosis not present

## 2014-12-01 DIAGNOSIS — Z6831 Body mass index (BMI) 31.0-31.9, adult: Secondary | ICD-10-CM | POA: Diagnosis not present

## 2014-12-01 DIAGNOSIS — I10 Essential (primary) hypertension: Secondary | ICD-10-CM | POA: Diagnosis not present

## 2014-12-01 DIAGNOSIS — M1712 Unilateral primary osteoarthritis, left knee: Secondary | ICD-10-CM | POA: Diagnosis not present

## 2014-12-23 DIAGNOSIS — R451 Restlessness and agitation: Secondary | ICD-10-CM | POA: Diagnosis not present

## 2014-12-23 DIAGNOSIS — M179 Osteoarthritis of knee, unspecified: Secondary | ICD-10-CM | POA: Diagnosis not present

## 2014-12-23 DIAGNOSIS — F0390 Unspecified dementia without behavioral disturbance: Secondary | ICD-10-CM | POA: Diagnosis not present

## 2014-12-29 DIAGNOSIS — M25562 Pain in left knee: Secondary | ICD-10-CM | POA: Diagnosis not present

## 2014-12-29 DIAGNOSIS — Z8719 Personal history of other diseases of the digestive system: Secondary | ICD-10-CM | POA: Diagnosis not present

## 2014-12-29 DIAGNOSIS — I10 Essential (primary) hypertension: Secondary | ICD-10-CM | POA: Diagnosis not present

## 2014-12-29 DIAGNOSIS — D649 Anemia, unspecified: Secondary | ICD-10-CM | POA: Diagnosis not present

## 2014-12-29 DIAGNOSIS — I509 Heart failure, unspecified: Secondary | ICD-10-CM | POA: Diagnosis not present

## 2015-01-20 ENCOUNTER — Emergency Department (HOSPITAL_COMMUNITY)
Admission: EM | Admit: 2015-01-20 | Discharge: 2015-01-20 | Disposition: A | Discharge status: Home / Self Care | Payer: Medicare Other | Attending: Emergency Medicine | Admitting: Emergency Medicine

## 2015-01-20 ENCOUNTER — Encounter (HOSPITAL_COMMUNITY): Payer: Self-pay

## 2015-01-20 DIAGNOSIS — I252 Old myocardial infarction: Secondary | ICD-10-CM | POA: Insufficient documentation

## 2015-01-20 DIAGNOSIS — Z7982 Long term (current) use of aspirin: Secondary | ICD-10-CM | POA: Insufficient documentation

## 2015-01-20 DIAGNOSIS — N183 Chronic kidney disease, stage 3 (moderate): Secondary | ICD-10-CM | POA: Insufficient documentation

## 2015-01-20 DIAGNOSIS — Z87891 Personal history of nicotine dependence: Secondary | ICD-10-CM | POA: Insufficient documentation

## 2015-01-20 DIAGNOSIS — I25119 Atherosclerotic heart disease of native coronary artery with unspecified angina pectoris: Secondary | ICD-10-CM | POA: Diagnosis not present

## 2015-01-20 DIAGNOSIS — R519 Headache, unspecified: Secondary | ICD-10-CM

## 2015-01-20 DIAGNOSIS — Z9889 Other specified postprocedural states: Secondary | ICD-10-CM | POA: Insufficient documentation

## 2015-01-20 DIAGNOSIS — F0280 Dementia in other diseases classified elsewhere without behavioral disturbance: Secondary | ICD-10-CM | POA: Diagnosis not present

## 2015-01-20 DIAGNOSIS — Z9861 Coronary angioplasty status: Secondary | ICD-10-CM | POA: Insufficient documentation

## 2015-01-20 DIAGNOSIS — I129 Hypertensive chronic kidney disease with stage 1 through stage 4 chronic kidney disease, or unspecified chronic kidney disease: Secondary | ICD-10-CM | POA: Diagnosis not present

## 2015-01-20 DIAGNOSIS — F329 Major depressive disorder, single episode, unspecified: Secondary | ICD-10-CM | POA: Insufficient documentation

## 2015-01-20 DIAGNOSIS — G309 Alzheimer's disease, unspecified: Secondary | ICD-10-CM | POA: Diagnosis not present

## 2015-01-20 DIAGNOSIS — Z8673 Personal history of transient ischemic attack (TIA), and cerebral infarction without residual deficits: Secondary | ICD-10-CM | POA: Diagnosis not present

## 2015-01-20 DIAGNOSIS — R51 Headache: Secondary | ICD-10-CM | POA: Insufficient documentation

## 2015-01-20 DIAGNOSIS — F419 Anxiety disorder, unspecified: Secondary | ICD-10-CM | POA: Diagnosis not present

## 2015-01-20 DIAGNOSIS — Z79899 Other long term (current) drug therapy: Secondary | ICD-10-CM | POA: Insufficient documentation

## 2015-01-20 DIAGNOSIS — Z8639 Personal history of other endocrine, nutritional and metabolic disease: Secondary | ICD-10-CM | POA: Insufficient documentation

## 2015-01-20 NOTE — Discharge Instructions (Signed)
As discussed, your evaluation today has been largely reassuring.  But, it is important that you monitor your condition carefully, and do not hesitate to return to the ED if you develop new, or concerning changes in your condition. ? ?Otherwise, please follow-up with your physician for appropriate ongoing care. ? ?

## 2015-01-20 NOTE — ED Notes (Signed)
Pt reports woke up with a headache today and c/o feeling "nervous."  Wife says pt has history of dementia and reports has noticed recently, about every other day, pt feels very nervous.

## 2015-01-20 NOTE — ED Notes (Signed)
Wife says pt took a tramadol approx 2 hours ago for headache.

## 2015-01-20 NOTE — ED Provider Notes (Signed)
CSN: [id number]     Arrival date & time 01/20/15  1347 History   First MD Initiated Contact with Patient 01/20/15 1405     Chief Complaint  Patient presents with  . Anxiety  . Headache     (Consider location/radiation/quality/duration/timing/severity/associated sxs/prior Treatment) HPI Patient presents with his wife who presents much of the history of present illness. Patient has a history of dementia. Level V caveat. Patient states that he had a headache earlier today, has no headache currently. Wife reports that the patient has frequent headache, typically improves with medication, but also cause episodes of anxiety, restlessness. Patient has tried multiple medications for dementia, anxiety, [facility]. Seemingly, all medication induces additional anxiety.   Past Medical History  Diagnosis Date  . Hyperlipidemia   . Coronary atherosclerosis of native coronary artery     BMS circumflex and OM 2006, PTCA ISR 2010, moderate LAD disease  . DJD (degenerative joint disease)   . Macular degeneration   . Alzheimer's dementia   . CKD (chronic kidney disease), stage III   . History of stroke     Seen radiographically-left PICA; unknown to patient.  Marland Kitchen TIA (transient ischemic attack) 03/05/2014  . 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 CAD (coronary artery disease), native coronary artery     Initially diagnosed 2003 2006 catheterization with bare metal stenting of the first marginal branch of the circumflex and continuation branch of the circumflex 2010 catheterization showing normal left main, 50-60% LAD unchanged from before, proximal stent of marginal patent, severe in-stent restenosis treated with cutting balloon angioplasty, patent stent in continuation branch with mild in-stent restenosis, occluded nondominant right coronary artery.   . Myocardial infarction     x5    1980s  sees DR. MICHAEL COOPER  . Anginal pain     NONE IN 1  MONTH   . Hypertension   . Stroke   . Depression   . Headache    Past Surgical History  Procedure Laterality Date  . Appendectomy    . Cardiac catheterization      3 STENTS  AT  [place]  80S OR 90S  . Coronary angioplasty    . Cholecystectomy    . Multiple extractions with alveoloplasty N/A 10/22/2014    Procedure: MULTIPLE EXTRACTIONS  # 4,6, 7, 8, 10, 21, 22, 23, 24, 25, 26, 27, 28 and ALVEOLOPLASTY;  Surgeon: Diona Browner, DDS;  Location: [facility];  Service: Oral Surgery;  Laterality: N/A;   Family History  Problem Relation Age of Onset  . Heart failure     Social History  Substance Use Topics  . Smoking status: Former Smoker    Types: Cigarettes    Quit date: 04/23/1966  . Smokeless tobacco: Never Used  . Alcohol Use: No    Review of Systems  Unable to perform ROS: Dementia  Endocrine: Positive for heat intolerance.      Allergies  Aricept; Ativan; Atorvastatin; Namenda; Other; Simvastatin; and Sorbitan  Home Medications   Prior to Admission medications   Medication Sig Start Date End Date Taking? Authorizing Provider  Ascorbic Acid (VITAMIN C) 500 MG tablet Take 500 mg by mouth daily.     Yes Historical Provider, MD  aspirin EC 81 MG tablet Take 2 tablets (162 mg total) by mouth daily. Patient taking differently: Take 81 mg by mouth daily.  03/06/14  Yes Rexene Alberts, MD  Coenzyme Q10 (COQ10) 100 MG CAPS Take 1 capsule by mouth daily.  Yes Historical Provider, MD  Cyanocobalamin (VITAMIN B-12 PO) Take 1 tablet by mouth daily.   Yes Historical Provider, MD  ibuprofen (ADVIL,MOTRIN) 200 MG tablet Take 2 tablets (400 mg total) by mouth every 6 (six) hours as needed for headache. 03/06/14  Yes Rexene Alberts, MD  Melatonin 5 MG/15ML LIQD Take 5 mg by mouth at bedtime.   Yes Historical Provider, MD  metoprolol (LOPRESSOR) 25 MG tablet Take 1 tablet (25 mg total) by mouth daily. Patient taking differently: Take 12.5 mg by mouth 2 (two) times daily.  07/12/14  Yes  Rhonda G Barrett, PA-C  Red Yeast Rice 600 MG CAPS Take 1 capsule by mouth every morning.    Yes Historical Provider, MD  traMADol (ULTRAM) 50 MG tablet Take 50 mg by mouth 3 (three) times daily as needed for moderate pain.   Yes Historical Provider, MD  amoxicillin (AMOXIL) 500 MG capsule Take 1 capsule (500 mg total) by mouth 3 (three) times daily. Patient not taking: Reported on 01/20/2015 10/22/14   Diona Browner, DDS  isosorbide mononitrate (IMDUR) 30 MG 24 hr tablet Take 0.5 tablets (15 mg total) by mouth daily. Patient not taking: Reported on 01/20/2015 07/12/14   Evelene Croon Barrett, PA-C  meclizine (ANTIVERT) 12.5 MG tablet Take 1 tablet (12.5 mg total) by mouth 3 (three) times daily as needed for dizziness. 03/06/14   Rexene Alberts, MD  NITROSTAT 0.4 MG SL tablet DISSOLVE ONE TABLET UNDER THE TONGUE EVERY 5 MINUTES AS NEEDED FOR CHEST PAIN 04/12/14   [name], MD  oxyCODONE-acetaminophen (PERCOCET) 5-325 MG per tablet Take 1-2 tablets by mouth every 4 (four) hours as needed for severe pain. Patient not taking: Reported on 01/20/2015 10/22/14   Diona Browner, DDS   BP 129/87 mmHg  Pulse 89  Temp(Src) 97.9 F (36.6 C) (Oral)  Resp 16  Ht 5\' 9"  (1.753 m)  Wt 200 lb (90.719 kg)  BMI 29.52 kg/m2  SpO2 96% Physical Exam  Constitutional: He appears well-developed. No distress.  HENT:  Head: Normocephalic and atraumatic.  Eyes: Conjunctivae and EOM are normal.  Cardiovascular: Normal rate and regular rhythm.   Pulmonary/Chest: Effort normal. No stridor. No respiratory distress.  Abdominal: He exhibits no distension.  Musculoskeletal: He exhibits no edema.  Neurological: He is alert.  Skin: Skin is warm and dry.  Psychiatric: He has a normal mood and affect. His speech is tangential. Cognition and memory are impaired.  Nursing note and vitals reviewed.   ED Course  Procedures (including critical care time)   MDM   Final diagnoses:  Bad headache   elderly male dementia, frequent  headaches, anxiety now presents after headache, which has resolved, and after an episode of profound anxiety. Here the patient is awake, alert, appropriately interactive, though obviously disoriented. No evidence for new systemic pathology, vital signs are stable, he is moving all extremity spontaneously, and symptoms are likely due to his chronic disease. [facility] and I discussed the need to discuss medication options with his care team, with the wife present. Patient discharged in stable condition.   Carmin Muskrat, MD 01/20/15 [phone]

## 2015-01-20 NOTE — ED Notes (Addendum)
Pt tolerated ambulation around nurses station with steady gait, o2 sats 92-95% on RA and 85 HR.   85 HR, 20 RR and 96% on RA upon reentry to exam room.  Pt c/o shortness of breath.

## 2015-02-02 DIAGNOSIS — R45851 Suicidal ideations: Secondary | ICD-10-CM | POA: Diagnosis not present

## 2015-02-02 DIAGNOSIS — Z7982 Long term (current) use of aspirin: Secondary | ICD-10-CM | POA: Diagnosis not present

## 2015-02-02 DIAGNOSIS — Z955 Presence of coronary angioplasty implant and graft: Secondary | ICD-10-CM | POA: Diagnosis not present

## 2015-02-02 DIAGNOSIS — F0390 Unspecified dementia without behavioral disturbance: Secondary | ICD-10-CM | POA: Diagnosis not present

## 2015-02-10 DIAGNOSIS — M129 Arthropathy, unspecified: Secondary | ICD-10-CM | POA: Diagnosis not present

## 2015-02-10 DIAGNOSIS — G301 Alzheimer's disease with late onset: Secondary | ICD-10-CM | POA: Diagnosis not present

## 2015-02-10 DIAGNOSIS — F331 Major depressive disorder, recurrent, moderate: Secondary | ICD-10-CM | POA: Diagnosis not present

## 2015-02-10 DIAGNOSIS — F0151 Vascular dementia with behavioral disturbance: Secondary | ICD-10-CM | POA: Diagnosis not present

## 2015-02-17 DIAGNOSIS — Z8719 Personal history of other diseases of the digestive system: Secondary | ICD-10-CM | POA: Diagnosis not present

## 2015-02-17 DIAGNOSIS — M25562 Pain in left knee: Secondary | ICD-10-CM | POA: Diagnosis not present

## 2015-02-17 DIAGNOSIS — I10 Essential (primary) hypertension: Secondary | ICD-10-CM | POA: Diagnosis not present

## 2015-02-17 DIAGNOSIS — D649 Anemia, unspecified: Secondary | ICD-10-CM | POA: Diagnosis not present

## 2015-02-17 DIAGNOSIS — I509 Heart failure, unspecified: Secondary | ICD-10-CM | POA: Diagnosis not present

## 2015-03-04 ENCOUNTER — Encounter (HOSPITAL_COMMUNITY): Payer: Self-pay | Admitting: *Deleted

## 2015-03-04 ENCOUNTER — Emergency Department (HOSPITAL_COMMUNITY): Payer: Medicare Other

## 2015-03-04 ENCOUNTER — Emergency Department (HOSPITAL_COMMUNITY)
Admission: EM | Admit: 2015-03-04 | Discharge: 2015-03-04 | Disposition: A | Discharge status: Home / Self Care | Payer: Medicare Other | Attending: Emergency Medicine | Admitting: Emergency Medicine

## 2015-03-04 DIAGNOSIS — Z7982 Long term (current) use of aspirin: Secondary | ICD-10-CM | POA: Insufficient documentation

## 2015-03-04 DIAGNOSIS — N183 Chronic kidney disease, stage 3 (moderate): Secondary | ICD-10-CM | POA: Diagnosis not present

## 2015-03-04 DIAGNOSIS — I252 Old myocardial infarction: Secondary | ICD-10-CM | POA: Diagnosis not present

## 2015-03-04 DIAGNOSIS — Z79899 Other long term (current) drug therapy: Secondary | ICD-10-CM | POA: Insufficient documentation

## 2015-03-04 DIAGNOSIS — I129 Hypertensive chronic kidney disease with stage 1 through stage 4 chronic kidney disease, or unspecified chronic kidney disease: Secondary | ICD-10-CM | POA: Diagnosis not present

## 2015-03-04 DIAGNOSIS — F329 Major depressive disorder, single episode, unspecified: Secondary | ICD-10-CM | POA: Insufficient documentation

## 2015-03-04 DIAGNOSIS — F0390 Unspecified dementia without behavioral disturbance: Secondary | ICD-10-CM | POA: Diagnosis not present

## 2015-03-04 DIAGNOSIS — F0280 Dementia in other diseases classified elsewhere without behavioral disturbance: Secondary | ICD-10-CM | POA: Insufficient documentation

## 2015-03-04 DIAGNOSIS — M1990 Unspecified osteoarthritis, unspecified site: Secondary | ICD-10-CM | POA: Diagnosis not present

## 2015-03-04 DIAGNOSIS — Z9889 Other specified postprocedural states: Secondary | ICD-10-CM | POA: Insufficient documentation

## 2015-03-04 DIAGNOSIS — F419 Anxiety disorder, unspecified: Secondary | ICD-10-CM | POA: Diagnosis not present

## 2015-03-04 DIAGNOSIS — Z8639 Personal history of other endocrine, nutritional and metabolic disease: Secondary | ICD-10-CM | POA: Diagnosis not present

## 2015-03-04 DIAGNOSIS — R079 Chest pain, unspecified: Secondary | ICD-10-CM | POA: Diagnosis not present

## 2015-03-04 DIAGNOSIS — Z87891 Personal history of nicotine dependence: Secondary | ICD-10-CM | POA: Diagnosis not present

## 2015-03-04 DIAGNOSIS — G309 Alzheimer's disease, unspecified: Secondary | ICD-10-CM | POA: Insufficient documentation

## 2015-03-04 DIAGNOSIS — I25119 Atherosclerotic heart disease of native coronary artery with unspecified angina pectoris: Secondary | ICD-10-CM | POA: Insufficient documentation

## 2015-03-04 DIAGNOSIS — Z8673 Personal history of transient ischemic attack (TIA), and cerebral infarction without residual deficits: Secondary | ICD-10-CM | POA: Diagnosis not present

## 2015-03-04 DIAGNOSIS — R410 Disorientation, unspecified: Secondary | ICD-10-CM | POA: Diagnosis not present

## 2015-03-04 LAB — URINALYSIS, ROUTINE W REFLEX MICROSCOPIC
Bilirubin Urine: NEGATIVE
GLUCOSE, UA: NEGATIVE mg/dL
HGB URINE DIPSTICK: NEGATIVE
Ketones, ur: NEGATIVE mg/dL
LEUKOCYTES UA: NEGATIVE
NITRITE: NEGATIVE
PH: 6 (ref 5.0–8.0)
Protein, ur: NEGATIVE mg/dL
Specific Gravity, Urine: 1.01 (ref 1.005–1.030)
UROBILINOGEN UA: 0.2 mg/dL (ref 0.0–1.0)

## 2015-03-04 LAB — CBC WITH DIFFERENTIAL/PLATELET
BASOS ABS: 0 10*3/uL (ref 0.0–0.1)
BASOS PCT: 0 %
EOS ABS: 0.2 10*3/uL (ref 0.0–0.7)
EOS PCT: 2 %
HCT: 43.2 % (ref 39.0–52.0)
Hemoglobin: 13.9 g/dL (ref 13.0–17.0)
Lymphocytes Relative: 20 %
Lymphs Abs: 1.3 10*3/uL (ref 0.7–4.0)
MCH: 30.6 pg (ref 26.0–34.0)
MCHC: 32.2 g/dL (ref 30.0–36.0)
MCV: 95.2 fL (ref 78.0–100.0)
Monocytes Absolute: 0.5 10*3/uL (ref 0.1–1.0)
Monocytes Relative: 8 %
Neutro Abs: 4.8 10*3/uL (ref 1.7–7.7)
Neutrophils Relative %: 70 %
PLATELETS: 206 10*3/uL (ref 150–400)
RBC: 4.54 MIL/uL (ref 4.22–5.81)
RDW: 13.6 % (ref 11.5–15.5)
WBC: 6.8 10*3/uL (ref 4.0–10.5)

## 2015-03-04 LAB — HEPATIC FUNCTION PANEL
ALBUMIN: 3.9 g/dL (ref 3.5–5.0)
ALT: 17 U/L (ref 17–63)
AST: 20 U/L (ref 15–41)
Alkaline Phosphatase: 54 U/L (ref 38–126)
BILIRUBIN DIRECT: 0.1 mg/dL (ref 0.1–0.5)
Indirect Bilirubin: 0.2 mg/dL — ABNORMAL LOW (ref 0.3–0.9)
Total Bilirubin: 0.3 mg/dL (ref 0.3–1.2)
Total Protein: 6.7 g/dL (ref 6.5–8.1)

## 2015-03-04 LAB — BASIC METABOLIC PANEL
ANION GAP: 8 (ref 5–15)
BUN: 27 mg/dL — ABNORMAL HIGH (ref 6–20)
CALCIUM: 9 mg/dL (ref 8.9–10.3)
CO2: 25 mmol/L (ref 22–32)
Chloride: 106 mmol/L (ref 101–111)
Creatinine, Ser: 1.64 mg/dL — ABNORMAL HIGH (ref 0.61–1.24)
GFR, EST AFRICAN AMERICAN: 43 mL/min — AB (ref >60)
GFR, EST NON AFRICAN AMERICAN: 37 mL/min — AB (ref >60)
Glucose, Bld: 86 mg/dL (ref 65–99)
Potassium: 4.2 mmol/L (ref 3.5–5.1)
Sodium: 139 mmol/L (ref 135–145)

## 2015-03-04 LAB — TROPONIN I

## 2015-03-04 NOTE — ED Provider Notes (Signed)
CSN: [id number]     Arrival date & time 03/04/15  1652 History   First MD Initiated Contact with Patient 03/04/15 1706     Chief Complaint  Patient presents with  . Chest Pain     (Consider location/radiation/quality/duration/timing/severity/associated sxs/prior Treatment) Patient is a 81 y.o. male presenting with chest pain. The history is provided by the spouse (The wife states that the patient has been more confused lately walking around in the yard a lot. He did complain a little bit of chest pain. But she mainly brought him in here for more confusion).  Chest Pain Pain location:  L chest Pain quality: aching   Pain radiates to:  Does not radiate Pain radiates to the back: no   Pain severity:  Mild Onset quality:  Gradual Timing:  Intermittent Progression:  Partially resolved Chronicity:  Recurrent Associated symptoms: no abdominal pain, no back pain, no cough, no fatigue and no headache     Past Medical History  Diagnosis Date  . Hyperlipidemia   . Coronary atherosclerosis of native coronary artery     BMS circumflex and OM 2006, PTCA ISR 2010, moderate LAD disease  . DJD (degenerative joint disease)   . Macular degeneration   . Alzheimer's dementia   . CKD (chronic kidney disease), stage III   . History of stroke     Seen radiographically-left PICA; unknown to patient.  Marland Kitchen TIA (transient ischemic attack) 03/05/2014  . 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 CAD (coronary artery disease), native coronary artery     Initially diagnosed 2003 2006 catheterization with bare metal stenting of the first marginal branch of the circumflex and continuation branch of the circumflex 2010 catheterization showing normal left main, 50-60% LAD unchanged from before, proximal stent of marginal patent, severe in-stent restenosis treated with cutting balloon angioplasty, patent stent in continuation branch with mild in-stent restenosis, occluded nondominant right coronary  artery.   . Myocardial infarction ([place])     x5    1980s  sees DR. MICHAEL COOPER  . Anginal pain ([facility])     NONE IN 1 MONTH   . Hypertension   . Stroke ([place])   . Depression   . Headache    Past Surgical History  Procedure Laterality Date  . Appendectomy    . Cardiac catheterization      3 STENTS  AT  [place]  80S OR 90S  . Coronary angioplasty    . Cholecystectomy    . Multiple extractions with alveoloplasty N/A 10/22/2014    Procedure: MULTIPLE EXTRACTIONS  # 4,6, 7, 8, 10, 21, 22, 23, 24, 25, 26, 27, 28 and ALVEOLOPLASTY;  Surgeon: Diona Browner, DDS;  Location: [facility];  Service: Oral Surgery;  Laterality: N/A;   Family History  Problem Relation Age of Onset  . Heart failure     Social History  Substance Use Topics  . Smoking status: Former Smoker    Types: Cigarettes    Quit date: 04/23/1966  . Smokeless tobacco: Never Used  . Alcohol Use: No    Review of Systems  Constitutional: Negative for appetite change and fatigue.  HENT: Negative for congestion, ear discharge and sinus pressure.   Eyes: Negative for discharge.  Respiratory: Negative for cough.   Cardiovascular: Positive for chest pain.  Gastrointestinal: Negative for abdominal pain and diarrhea.  Genitourinary: Negative for frequency and hematuria.  Musculoskeletal: Negative for back pain.  Skin: Negative for rash.  Neurological: Negative for seizures and headaches.  Psychiatric/Behavioral: Negative for  hallucinations.      Allergies  Aricept; Ativan; Atorvastatin; Namenda; Other; Simvastatin; and Sorbitan  Home Medications   Prior to Admission medications   Medication Sig Start Date End Date Taking? Authorizing Provider  Ascorbic Acid (VITAMIN C) 500 MG tablet Take 500 mg by mouth daily.     Yes Historical Provider, MD  aspirin EC 81 MG tablet Take 2 tablets (162 mg total) by mouth daily. Patient taking differently: Take 81 mg by mouth daily.  03/06/14  Yes Rexene Alberts, MD  clonazePAM (KLONOPIN)  0.5 MG tablet Take 0.5 mg by mouth 2 (two) times daily. 02/10/15  Yes Historical Provider, MD  Coenzyme Q10 (COQ10) 100 MG CAPS Take 1 capsule by mouth daily.    Yes Historical Provider, MD  ibuprofen (ADVIL,MOTRIN) 200 MG tablet Take 2 tablets (400 mg total) by mouth every 6 (six) hours as needed for headache. Patient taking differently: Take 400-800 mg by mouth every 6 (six) hours as needed for headache.  03/06/14  Yes Rexene Alberts, MD  meclizine (ANTIVERT) 12.5 MG tablet Take 1 tablet (12.5 mg total) by mouth 3 (three) times daily as needed for dizziness. 03/06/14  Yes Rexene Alberts, MD  Melatonin 5 MG/15ML LIQD Take 9 mg by mouth at bedtime.    Yes Historical Provider, MD  metoprolol (LOPRESSOR) 25 MG tablet Take 1 tablet (25 mg total) by mouth daily. Patient taking differently: Take 12.5 mg by mouth 2 (two) times daily.  07/12/14  Yes Rhonda G Barrett, PA-C  NITROSTAT 0.4 MG SL tablet DISSOLVE ONE TABLET UNDER THE TONGUE EVERY 5 MINUTES AS NEEDED FOR CHEST PAIN 04/12/14  Yes [name], MD  Red Yeast Rice 600 MG CAPS Take 1 capsule by mouth every morning.    Yes Historical Provider, MD  traMADol (ULTRAM) 50 MG tablet Take 50 mg by mouth 3 (three) times daily as needed for moderate pain.   Yes Historical Provider, MD  amoxicillin (AMOXIL) 500 MG capsule Take 1 capsule (500 mg total) by mouth 3 (three) times daily. Patient not taking: Reported on 01/20/2015 10/22/14   Diona Browner, DDS  citalopram (CELEXA) 20 MG tablet Take 20 mg by mouth daily. 02/10/15   Historical Provider, MD  isosorbide mononitrate (IMDUR) 30 MG 24 hr tablet Take 0.5 tablets (15 mg total) by mouth daily. Patient not taking: Reported on 01/20/2015 07/12/14   Evelene Croon Barrett, PA-C  oxyCODONE-acetaminophen (PERCOCET) 5-325 MG per tablet Take 1-2 tablets by mouth every 4 (four) hours as needed for severe pain. Patient not taking: Reported on 01/20/2015 10/22/14   Diona Browner, DDS   BP 116/67 mmHg  Pulse 94  Temp(Src) 97.9 F  (36.6 C) (Oral)  Resp 14  Ht 5\' 10"  (1.778 m)  Wt 200 lb (90.719 kg)  BMI 28.70 kg/m2  SpO2 98% Physical Exam  Constitutional: He appears well-developed.  HENT:  Head: Normocephalic.  Eyes: Conjunctivae and EOM are normal. No scleral icterus.  Neck: Neck supple. No thyromegaly present.  Cardiovascular: Normal rate and regular rhythm.  Exam reveals no gallop and no friction rub.   No murmur heard. Pulmonary/Chest: No stridor. He has no wheezes. He has no rales. He exhibits no tenderness.  Abdominal: He exhibits no distension. There is no tenderness. There is no rebound.  Musculoskeletal: Normal range of motion. He exhibits no edema.  Lymphadenopathy:    He has no cervical adenopathy.  Neurological: He is alert. He exhibits normal muscle tone. Coordination normal.  Oriented to person only.  Skin: No  rash noted. No erythema.  Psychiatric: He has a normal mood and affect. His behavior is normal.    ED Course  Procedures (including critical care time) Labs Review Labs Reviewed  BASIC METABOLIC PANEL - Abnormal; Notable for the following:    BUN 27 (*)    Creatinine, Ser 1.64 (*)    GFR calc non Af Amer 37 (*)    GFR calc Af Amer 43 (*)    All other components within normal limits  HEPATIC FUNCTION PANEL - Abnormal; Notable for the following:    Indirect Bilirubin 0.2 (*)    All other components within normal limits  CBC WITH DIFFERENTIAL/PLATELET  TROPONIN I  URINALYSIS, ROUTINE W REFLEX MICROSCOPIC (NOT AT [facility])    Imaging Review Dg Chest 2 View  03/04/2015  CLINICAL DATA:  Confusion, AMS, Weakness, hallucinations since today. History of HTN, CAD, CKD, Stroke, TIA, cardiac cath. EXAM: CHEST  2 VIEW COMPARISON:  11/08/2014 FINDINGS: Shallow lung inflation. Heart size upper limits normal. No focal consolidations or pleural effusions. Old rib fractures are noted. Surgical clips are seen in the upper abdomen. IMPRESSION: No evidence for acute cardiopulmonary abnormality.  Electronically Signed   By: Nolon Nations M.D.   On: 03/04/2015 18:11   Ct Head Wo Contrast  03/04/2015  CLINICAL DATA:  Confusion. Generalized weakness. Hallucinations at home. History of Alzheimer's disease. EXAM: CT HEAD WITHOUT CONTRAST TECHNIQUE: Contiguous axial images were obtained from the base of the skull through the vertex without intravenous contrast. COMPARISON:  03/05/2014 FINDINGS: Ventricles are normal in size and configuration. There are no parenchymal masses or mass effect. Small old lacune infarct lies in the right base a ganglia, stable. There is minor periventricular white matter hypoattenuation consistent with chronic microvascular ischemic change. There is no evidence of a cortical infarct. There are no extra-axial masses or abnormal fluid collections. There is no intracranial hemorrhage Visualized sinuses and mastoid air cells are clear. No skull lesion. IMPRESSION: 1. No acute intracranial abnormalities. Stable appearance from the prior study. Electronically Signed   By: Lajean Manes M.D.   On: 03/04/2015 18:08   I have personally reviewed and evaluated these images and lab results as part of my medical decision-making.   EKG Interpretation   Date/Time:  Friday March 04 2015 16:58:45 EST Ventricular Rate:  96 PR Interval:  176 QRS Duration: 134 QT Interval:  374 QTC Calculation: 473 R Axis:   24 Text Interpretation:  Sinus rhythm Right bundle branch block Confirmed by  Slyvester Latona  MD, Arris Meyn [phone]) on 03/04/2015 8:01:10 PM      MDM   Final diagnoses:  Dementia, without behavioral disturbance    Labs unremarkable. Patient has worsening dementia. His wife is going to contact his family doctor Monday to look into arranging placement.    Milton Ferguson, MD 03/04/15 2001

## 2015-03-04 NOTE — ED Notes (Signed)
MD at bedside. 

## 2015-03-04 NOTE — Discharge Instructions (Signed)
Call your family md next monday

## 2015-03-04 NOTE — ED Notes (Signed)
Per wife pt also has been seeing people in the home that are not there

## 2015-03-04 NOTE — ED Notes (Signed)
[facility] reports that call came out for [facility], wife reported that pt has anxious today and generalized weakness

## 2015-03-05 DIAGNOSIS — R296 Repeated falls: Secondary | ICD-10-CM | POA: Diagnosis not present

## 2015-03-05 DIAGNOSIS — E642 Sequelae of vitamin C deficiency: Secondary | ICD-10-CM | POA: Diagnosis not present

## 2015-03-05 DIAGNOSIS — M6281 Muscle weakness (generalized): Secondary | ICD-10-CM | POA: Diagnosis not present

## 2015-03-05 DIAGNOSIS — F339 Major depressive disorder, recurrent, unspecified: Secondary | ICD-10-CM | POA: Diagnosis not present

## 2015-03-05 DIAGNOSIS — R401 Stupor: Secondary | ICD-10-CM | POA: Diagnosis not present

## 2015-03-05 DIAGNOSIS — R4182 Altered mental status, unspecified: Secondary | ICD-10-CM | POA: Diagnosis not present

## 2015-03-05 DIAGNOSIS — R079 Chest pain, unspecified: Secondary | ICD-10-CM | POA: Diagnosis not present

## 2015-03-05 DIAGNOSIS — M549 Dorsalgia, unspecified: Secondary | ICD-10-CM | POA: Diagnosis not present

## 2015-03-05 DIAGNOSIS — M129 Arthropathy, unspecified: Secondary | ICD-10-CM | POA: Diagnosis not present

## 2015-03-05 DIAGNOSIS — F413 Other mixed anxiety disorders: Secondary | ICD-10-CM | POA: Diagnosis not present

## 2015-03-05 DIAGNOSIS — E785 Hyperlipidemia, unspecified: Secondary | ICD-10-CM | POA: Diagnosis not present

## 2015-03-05 DIAGNOSIS — R1319 Other dysphagia: Secondary | ICD-10-CM | POA: Diagnosis not present

## 2015-03-05 DIAGNOSIS — E663 Overweight: Secondary | ICD-10-CM | POA: Diagnosis not present

## 2015-03-05 DIAGNOSIS — F0151 Vascular dementia with behavioral disturbance: Secondary | ICD-10-CM | POA: Diagnosis not present

## 2015-03-05 DIAGNOSIS — S2020XA Contusion of thorax, unspecified, initial encounter: Secondary | ICD-10-CM | POA: Diagnosis not present

## 2015-03-05 DIAGNOSIS — I25119 Atherosclerotic heart disease of native coronary artery with unspecified angina pectoris: Secondary | ICD-10-CM | POA: Diagnosis not present

## 2015-03-05 DIAGNOSIS — F419 Anxiety disorder, unspecified: Secondary | ICD-10-CM | POA: Diagnosis not present

## 2015-03-05 DIAGNOSIS — M8580 Other specified disorders of bone density and structure, unspecified site: Secondary | ICD-10-CM | POA: Diagnosis not present

## 2015-03-05 DIAGNOSIS — F0281 Dementia in other diseases classified elsewhere with behavioral disturbance: Secondary | ICD-10-CM | POA: Diagnosis not present

## 2015-03-05 DIAGNOSIS — R531 Weakness: Secondary | ICD-10-CM | POA: Diagnosis not present

## 2015-03-05 DIAGNOSIS — G4089 Other seizures: Secondary | ICD-10-CM | POA: Diagnosis not present

## 2015-03-05 DIAGNOSIS — F0391 Unspecified dementia with behavioral disturbance: Secondary | ICD-10-CM | POA: Diagnosis not present

## 2015-03-05 DIAGNOSIS — S50311A Abrasion of right elbow, initial encounter: Secondary | ICD-10-CM | POA: Diagnosis not present

## 2015-03-05 DIAGNOSIS — R2689 Other abnormalities of gait and mobility: Secondary | ICD-10-CM | POA: Diagnosis not present

## 2015-03-05 DIAGNOSIS — W1839XA Other fall on same level, initial encounter: Secondary | ICD-10-CM | POA: Diagnosis not present

## 2015-03-05 DIAGNOSIS — F0390 Unspecified dementia without behavioral disturbance: Secondary | ICD-10-CM | POA: Diagnosis not present

## 2015-03-05 DIAGNOSIS — Z7982 Long term (current) use of aspirin: Secondary | ICD-10-CM | POA: Diagnosis not present

## 2015-03-05 DIAGNOSIS — G479 Sleep disorder, unspecified: Secondary | ICD-10-CM | POA: Diagnosis not present

## 2015-03-05 DIAGNOSIS — N179 Acute kidney failure, unspecified: Secondary | ICD-10-CM | POA: Diagnosis not present

## 2015-03-05 DIAGNOSIS — G9340 Encephalopathy, unspecified: Secondary | ICD-10-CM | POA: Diagnosis not present

## 2015-03-05 DIAGNOSIS — R279 Unspecified lack of coordination: Secondary | ICD-10-CM | POA: Diagnosis not present

## 2015-03-05 DIAGNOSIS — Z79899 Other long term (current) drug therapy: Secondary | ICD-10-CM | POA: Diagnosis not present

## 2015-03-05 DIAGNOSIS — G301 Alzheimer's disease with late onset: Secondary | ICD-10-CM | POA: Diagnosis not present

## 2015-03-05 DIAGNOSIS — R0782 Intercostal pain: Secondary | ICD-10-CM | POA: Diagnosis not present

## 2015-03-05 DIAGNOSIS — Z66 Do not resuscitate: Secondary | ICD-10-CM | POA: Diagnosis not present

## 2015-03-05 DIAGNOSIS — G459 Transient cerebral ischemic attack, unspecified: Secondary | ICD-10-CM | POA: Diagnosis not present

## 2015-03-05 DIAGNOSIS — R278 Other lack of coordination: Secondary | ICD-10-CM | POA: Diagnosis not present

## 2015-03-05 DIAGNOSIS — I129 Hypertensive chronic kidney disease with stage 1 through stage 4 chronic kidney disease, or unspecified chronic kidney disease: Secondary | ICD-10-CM | POA: Diagnosis not present

## 2015-03-05 DIAGNOSIS — N189 Chronic kidney disease, unspecified: Secondary | ICD-10-CM | POA: Diagnosis not present

## 2015-03-05 DIAGNOSIS — F331 Major depressive disorder, recurrent, moderate: Secondary | ICD-10-CM | POA: Diagnosis not present

## 2015-03-05 DIAGNOSIS — Z2821 Immunization not carried out because of patient refusal: Secondary | ICD-10-CM | POA: Diagnosis not present

## 2015-03-05 DIAGNOSIS — Z9181 History of falling: Secondary | ICD-10-CM | POA: Diagnosis not present

## 2015-03-05 DIAGNOSIS — Z8673 Personal history of transient ischemic attack (TIA), and cerebral infarction without residual deficits: Secondary | ICD-10-CM | POA: Diagnosis not present

## 2015-03-05 DIAGNOSIS — I2510 Atherosclerotic heart disease of native coronary artery without angina pectoris: Secondary | ICD-10-CM | POA: Diagnosis not present

## 2015-03-05 DIAGNOSIS — R45851 Suicidal ideations: Secondary | ICD-10-CM | POA: Diagnosis not present

## 2015-03-05 DIAGNOSIS — I10 Essential (primary) hypertension: Secondary | ICD-10-CM | POA: Diagnosis not present

## 2015-03-05 DIAGNOSIS — E538 Deficiency of other specified B group vitamins: Secondary | ICD-10-CM | POA: Diagnosis not present

## 2015-03-05 DIAGNOSIS — R488 Other symbolic dysfunctions: Secondary | ICD-10-CM | POA: Diagnosis not present

## 2015-03-05 DIAGNOSIS — R410 Disorientation, unspecified: Secondary | ICD-10-CM | POA: Diagnosis not present

## 2015-03-05 DIAGNOSIS — G9341 Metabolic encephalopathy: Secondary | ICD-10-CM | POA: Diagnosis not present

## 2015-03-08 DIAGNOSIS — R296 Repeated falls: Secondary | ICD-10-CM | POA: Diagnosis not present

## 2015-03-08 DIAGNOSIS — F411 Generalized anxiety disorder: Secondary | ICD-10-CM | POA: Diagnosis not present

## 2015-03-08 DIAGNOSIS — M129 Arthropathy, unspecified: Secondary | ICD-10-CM | POA: Diagnosis not present

## 2015-03-08 DIAGNOSIS — E538 Deficiency of other specified B group vitamins: Secondary | ICD-10-CM | POA: Diagnosis not present

## 2015-03-08 DIAGNOSIS — R531 Weakness: Secondary | ICD-10-CM | POA: Diagnosis not present

## 2015-03-08 DIAGNOSIS — G4089 Other seizures: Secondary | ICD-10-CM | POA: Diagnosis not present

## 2015-03-08 DIAGNOSIS — R1319 Other dysphagia: Secondary | ICD-10-CM | POA: Diagnosis not present

## 2015-03-08 DIAGNOSIS — N179 Acute kidney failure, unspecified: Secondary | ICD-10-CM | POA: Diagnosis not present

## 2015-03-08 DIAGNOSIS — F0390 Unspecified dementia without behavioral disturbance: Secondary | ICD-10-CM | POA: Diagnosis not present

## 2015-03-08 DIAGNOSIS — R488 Other symbolic dysfunctions: Secondary | ICD-10-CM | POA: Diagnosis not present

## 2015-03-08 DIAGNOSIS — F413 Other mixed anxiety disorders: Secondary | ICD-10-CM | POA: Diagnosis not present

## 2015-03-08 DIAGNOSIS — R079 Chest pain, unspecified: Secondary | ICD-10-CM | POA: Diagnosis not present

## 2015-03-08 DIAGNOSIS — R45851 Suicidal ideations: Secondary | ICD-10-CM | POA: Diagnosis not present

## 2015-03-08 DIAGNOSIS — F0281 Dementia in other diseases classified elsewhere with behavioral disturbance: Secondary | ICD-10-CM | POA: Diagnosis not present

## 2015-03-08 DIAGNOSIS — E782 Mixed hyperlipidemia: Secondary | ICD-10-CM | POA: Diagnosis not present

## 2015-03-08 DIAGNOSIS — F445 Conversion disorder with seizures or convulsions: Secondary | ICD-10-CM | POA: Diagnosis not present

## 2015-03-08 DIAGNOSIS — F0151 Vascular dementia with behavioral disturbance: Secondary | ICD-10-CM | POA: Diagnosis not present

## 2015-03-08 DIAGNOSIS — I10 Essential (primary) hypertension: Secondary | ICD-10-CM | POA: Diagnosis not present

## 2015-03-08 DIAGNOSIS — R2689 Other abnormalities of gait and mobility: Secondary | ICD-10-CM | POA: Diagnosis not present

## 2015-03-08 DIAGNOSIS — R278 Other lack of coordination: Secondary | ICD-10-CM | POA: Diagnosis not present

## 2015-03-08 DIAGNOSIS — S37099S Other injury of unspecified kidney, sequela: Secondary | ICD-10-CM | POA: Diagnosis not present

## 2015-03-08 DIAGNOSIS — G459 Transient cerebral ischemic attack, unspecified: Secondary | ICD-10-CM | POA: Diagnosis not present

## 2015-03-08 DIAGNOSIS — R401 Stupor: Secondary | ICD-10-CM | POA: Diagnosis not present

## 2015-03-08 DIAGNOSIS — G9341 Metabolic encephalopathy: Secondary | ICD-10-CM | POA: Diagnosis not present

## 2015-03-08 DIAGNOSIS — F331 Major depressive disorder, recurrent, moderate: Secondary | ICD-10-CM | POA: Diagnosis not present

## 2015-03-08 DIAGNOSIS — R279 Unspecified lack of coordination: Secondary | ICD-10-CM | POA: Diagnosis not present

## 2015-03-08 DIAGNOSIS — M6281 Muscle weakness (generalized): Secondary | ICD-10-CM | POA: Diagnosis not present

## 2015-03-08 DIAGNOSIS — G9340 Encephalopathy, unspecified: Secondary | ICD-10-CM | POA: Diagnosis not present

## 2015-03-08 DIAGNOSIS — I25119 Atherosclerotic heart disease of native coronary artery with unspecified angina pectoris: Secondary | ICD-10-CM | POA: Diagnosis not present

## 2015-03-08 DIAGNOSIS — M62830 Muscle spasm of back: Secondary | ICD-10-CM | POA: Diagnosis not present

## 2015-03-08 DIAGNOSIS — G301 Alzheimer's disease with late onset: Secondary | ICD-10-CM | POA: Diagnosis not present

## 2015-03-08 DIAGNOSIS — F0391 Unspecified dementia with behavioral disturbance: Secondary | ICD-10-CM | POA: Diagnosis not present

## 2015-03-08 DIAGNOSIS — F339 Major depressive disorder, recurrent, unspecified: Secondary | ICD-10-CM | POA: Diagnosis not present

## 2015-03-08 DIAGNOSIS — Z79899 Other long term (current) drug therapy: Secondary | ICD-10-CM | POA: Diagnosis not present

## 2015-03-08 DIAGNOSIS — K5900 Constipation, unspecified: Secondary | ICD-10-CM | POA: Diagnosis not present

## 2015-03-08 DIAGNOSIS — E642 Sequelae of vitamin C deficiency: Secondary | ICD-10-CM | POA: Diagnosis not present

## 2015-03-09 DIAGNOSIS — I10 Essential (primary) hypertension: Secondary | ICD-10-CM | POA: Diagnosis not present

## 2015-03-09 DIAGNOSIS — R45851 Suicidal ideations: Secondary | ICD-10-CM | POA: Diagnosis not present

## 2015-03-09 DIAGNOSIS — F445 Conversion disorder with seizures or convulsions: Secondary | ICD-10-CM | POA: Diagnosis not present

## 2015-03-09 DIAGNOSIS — G9340 Encephalopathy, unspecified: Secondary | ICD-10-CM | POA: Diagnosis not present

## 2015-03-09 DIAGNOSIS — S37099S Other injury of unspecified kidney, sequela: Secondary | ICD-10-CM | POA: Diagnosis not present

## 2015-03-10 DIAGNOSIS — K5900 Constipation, unspecified: Secondary | ICD-10-CM | POA: Diagnosis not present

## 2015-03-10 DIAGNOSIS — R45851 Suicidal ideations: Secondary | ICD-10-CM | POA: Diagnosis not present

## 2015-03-10 DIAGNOSIS — G9340 Encephalopathy, unspecified: Secondary | ICD-10-CM | POA: Diagnosis not present

## 2015-03-10 DIAGNOSIS — E782 Mixed hyperlipidemia: Secondary | ICD-10-CM | POA: Diagnosis not present

## 2015-03-10 DIAGNOSIS — I10 Essential (primary) hypertension: Secondary | ICD-10-CM | POA: Diagnosis not present

## 2015-03-12 DIAGNOSIS — M6281 Muscle weakness (generalized): Secondary | ICD-10-CM | POA: Diagnosis not present

## 2015-03-12 DIAGNOSIS — M62830 Muscle spasm of back: Secondary | ICD-10-CM | POA: Diagnosis not present

## 2015-03-18 DIAGNOSIS — G9340 Encephalopathy, unspecified: Secondary | ICD-10-CM | POA: Diagnosis not present

## 2015-03-18 DIAGNOSIS — S37099S Other injury of unspecified kidney, sequela: Secondary | ICD-10-CM | POA: Diagnosis not present

## 2015-03-18 DIAGNOSIS — F411 Generalized anxiety disorder: Secondary | ICD-10-CM | POA: Diagnosis not present

## 2015-03-18 DIAGNOSIS — I10 Essential (primary) hypertension: Secondary | ICD-10-CM | POA: Diagnosis not present

## 2015-03-18 DIAGNOSIS — R45851 Suicidal ideations: Secondary | ICD-10-CM | POA: Diagnosis not present

## 2015-03-21 DIAGNOSIS — M6281 Muscle weakness (generalized): Secondary | ICD-10-CM | POA: Diagnosis not present

## 2015-04-26 DIAGNOSIS — I10 Essential (primary) hypertension: Secondary | ICD-10-CM | POA: Diagnosis not present

## 2015-04-26 DIAGNOSIS — S37099S Other injury of unspecified kidney, sequela: Secondary | ICD-10-CM | POA: Diagnosis not present

## 2015-04-26 DIAGNOSIS — G9340 Encephalopathy, unspecified: Secondary | ICD-10-CM | POA: Diagnosis not present

## 2015-05-16 DIAGNOSIS — I10 Essential (primary) hypertension: Secondary | ICD-10-CM | POA: Diagnosis not present

## 2015-05-16 DIAGNOSIS — S37099S Other injury of unspecified kidney, sequela: Secondary | ICD-10-CM | POA: Diagnosis not present

## 2015-05-16 DIAGNOSIS — G9340 Encephalopathy, unspecified: Secondary | ICD-10-CM | POA: Diagnosis not present

## 2015-05-17 DIAGNOSIS — R4182 Altered mental status, unspecified: Secondary | ICD-10-CM | POA: Diagnosis not present

## 2015-05-27 DIAGNOSIS — S37099S Other injury of unspecified kidney, sequela: Secondary | ICD-10-CM | POA: Diagnosis not present

## 2015-05-27 DIAGNOSIS — G9340 Encephalopathy, unspecified: Secondary | ICD-10-CM | POA: Diagnosis not present

## 2015-05-27 DIAGNOSIS — I10 Essential (primary) hypertension: Secondary | ICD-10-CM | POA: Diagnosis not present

## 2015-05-30 DIAGNOSIS — R5381 Other malaise: Secondary | ICD-10-CM | POA: Diagnosis not present

## 2015-05-30 DIAGNOSIS — D510 Vitamin B12 deficiency anemia due to intrinsic factor deficiency: Secondary | ICD-10-CM | POA: Diagnosis not present

## 2015-06-14 DIAGNOSIS — Z79899 Other long term (current) drug therapy: Secondary | ICD-10-CM | POA: Diagnosis not present

## 2015-06-20 DIAGNOSIS — N390 Urinary tract infection, site not specified: Secondary | ICD-10-CM | POA: Diagnosis not present

## 2015-06-20 DIAGNOSIS — R4182 Altered mental status, unspecified: Secondary | ICD-10-CM | POA: Diagnosis not present

## 2015-06-20 DIAGNOSIS — R5381 Other malaise: Secondary | ICD-10-CM | POA: Diagnosis not present

## 2015-06-20 DIAGNOSIS — J309 Allergic rhinitis, unspecified: Secondary | ICD-10-CM | POA: Diagnosis not present

## 2015-06-20 DIAGNOSIS — J069 Acute upper respiratory infection, unspecified: Secondary | ICD-10-CM | POA: Diagnosis not present

## 2015-06-24 ENCOUNTER — Emergency Department (HOSPITAL_COMMUNITY): Payer: Medicare Other

## 2015-06-24 ENCOUNTER — Encounter (HOSPITAL_COMMUNITY): Payer: Self-pay | Admitting: Emergency Medicine

## 2015-06-24 ENCOUNTER — Inpatient Hospital Stay (HOSPITAL_COMMUNITY)
Admission: EM | Admit: 2015-06-24 | Discharge: 2015-06-27 | DRG: 480 | Disposition: A | Discharge status: Skilled Nursing Facility | Payer: Medicare Other | Attending: Internal Medicine | Admitting: Internal Medicine

## 2015-06-24 DIAGNOSIS — I25118 Atherosclerotic heart disease of native coronary artery with other forms of angina pectoris: Secondary | ICD-10-CM | POA: Diagnosis not present

## 2015-06-24 DIAGNOSIS — G309 Alzheimer's disease, unspecified: Secondary | ICD-10-CM | POA: Diagnosis not present

## 2015-06-24 DIAGNOSIS — I672 Cerebral atherosclerosis: Secondary | ICD-10-CM | POA: Diagnosis present

## 2015-06-24 DIAGNOSIS — S72101A Unspecified trochanteric fracture of right femur, initial encounter for closed fracture: Secondary | ICD-10-CM | POA: Diagnosis not present

## 2015-06-24 DIAGNOSIS — H3530 Unspecified macular degeneration: Secondary | ICD-10-CM | POA: Diagnosis present

## 2015-06-24 DIAGNOSIS — S72141A Displaced intertrochanteric fracture of right femur, initial encounter for closed fracture: Principal | ICD-10-CM | POA: Diagnosis present

## 2015-06-24 DIAGNOSIS — F329 Major depressive disorder, single episode, unspecified: Secondary | ICD-10-CM | POA: Diagnosis not present

## 2015-06-24 DIAGNOSIS — F0280 Dementia in other diseases classified elsewhere without behavioral disturbance: Secondary | ICD-10-CM | POA: Diagnosis present

## 2015-06-24 DIAGNOSIS — Z9181 History of falling: Secondary | ICD-10-CM | POA: Diagnosis not present

## 2015-06-24 DIAGNOSIS — M84359A Stress fracture, hip, unspecified, initial encounter for fracture: Secondary | ICD-10-CM | POA: Diagnosis not present

## 2015-06-24 DIAGNOSIS — I10 Essential (primary) hypertension: Secondary | ICD-10-CM | POA: Diagnosis not present

## 2015-06-24 DIAGNOSIS — S72101D Unspecified trochanteric fracture of right femur, subsequent encounter for closed fracture with routine healing: Secondary | ICD-10-CM | POA: Diagnosis not present

## 2015-06-24 DIAGNOSIS — S72009A Fracture of unspecified part of neck of unspecified femur, initial encounter for closed fracture: Secondary | ICD-10-CM | POA: Diagnosis present

## 2015-06-24 DIAGNOSIS — M79604 Pain in right leg: Secondary | ICD-10-CM | POA: Diagnosis not present

## 2015-06-24 DIAGNOSIS — R296 Repeated falls: Secondary | ICD-10-CM | POA: Diagnosis not present

## 2015-06-24 DIAGNOSIS — S72001A Fracture of unspecified part of neck of right femur, initial encounter for closed fracture: Secondary | ICD-10-CM

## 2015-06-24 DIAGNOSIS — Z8673 Personal history of transient ischemic attack (TIA), and cerebral infarction without residual deficits: Secondary | ICD-10-CM

## 2015-06-24 DIAGNOSIS — M6281 Muscle weakness (generalized): Secondary | ICD-10-CM | POA: Diagnosis not present

## 2015-06-24 DIAGNOSIS — Z7982 Long term (current) use of aspirin: Secondary | ICD-10-CM | POA: Diagnosis not present

## 2015-06-24 DIAGNOSIS — M25551 Pain in right hip: Secondary | ICD-10-CM | POA: Diagnosis present

## 2015-06-24 DIAGNOSIS — S299XXA Unspecified injury of thorax, initial encounter: Secondary | ICD-10-CM | POA: Diagnosis not present

## 2015-06-24 DIAGNOSIS — E785 Hyperlipidemia, unspecified: Secondary | ICD-10-CM | POA: Diagnosis present

## 2015-06-24 DIAGNOSIS — S728X9A Other fracture of unspecified femur, initial encounter for closed fracture: Secondary | ICD-10-CM | POA: Diagnosis not present

## 2015-06-24 DIAGNOSIS — J188 Other pneumonia, unspecified organism: Secondary | ICD-10-CM | POA: Diagnosis not present

## 2015-06-24 DIAGNOSIS — I2510 Atherosclerotic heart disease of native coronary artery without angina pectoris: Secondary | ICD-10-CM | POA: Diagnosis not present

## 2015-06-24 DIAGNOSIS — Z955 Presence of coronary angioplasty implant and graft: Secondary | ICD-10-CM

## 2015-06-24 DIAGNOSIS — I252 Old myocardial infarction: Secondary | ICD-10-CM

## 2015-06-24 DIAGNOSIS — E642 Sequelae of vitamin C deficiency: Secondary | ICD-10-CM | POA: Diagnosis not present

## 2015-06-24 DIAGNOSIS — R278 Other lack of coordination: Secondary | ICD-10-CM | POA: Diagnosis not present

## 2015-06-24 DIAGNOSIS — F0390 Unspecified dementia without behavioral disturbance: Secondary | ICD-10-CM | POA: Diagnosis present

## 2015-06-24 DIAGNOSIS — G459 Transient cerebral ischemic attack, unspecified: Secondary | ICD-10-CM | POA: Diagnosis not present

## 2015-06-24 DIAGNOSIS — N183 Chronic kidney disease, stage 3 unspecified: Secondary | ICD-10-CM | POA: Diagnosis present

## 2015-06-24 DIAGNOSIS — Z87891 Personal history of nicotine dependence: Secondary | ICD-10-CM | POA: Diagnosis not present

## 2015-06-24 DIAGNOSIS — E538 Deficiency of other specified B group vitamins: Secondary | ICD-10-CM | POA: Diagnosis not present

## 2015-06-24 DIAGNOSIS — W19XXXA Unspecified fall, initial encounter: Secondary | ICD-10-CM | POA: Diagnosis not present

## 2015-06-24 DIAGNOSIS — R2689 Other abnormalities of gait and mobility: Secondary | ICD-10-CM | POA: Diagnosis not present

## 2015-06-24 DIAGNOSIS — I25119 Atherosclerotic heart disease of native coronary artery with unspecified angina pectoris: Secondary | ICD-10-CM | POA: Diagnosis not present

## 2015-06-24 DIAGNOSIS — Z419 Encounter for procedure for purposes other than remedying health state, unspecified: Secondary | ICD-10-CM

## 2015-06-24 DIAGNOSIS — N179 Acute kidney failure, unspecified: Secondary | ICD-10-CM | POA: Diagnosis not present

## 2015-06-24 DIAGNOSIS — R079 Chest pain, unspecified: Secondary | ICD-10-CM | POA: Diagnosis not present

## 2015-06-24 DIAGNOSIS — S199XXA Unspecified injury of neck, initial encounter: Secondary | ICD-10-CM | POA: Diagnosis not present

## 2015-06-24 DIAGNOSIS — J189 Pneumonia, unspecified organism: Secondary | ICD-10-CM | POA: Diagnosis not present

## 2015-06-24 DIAGNOSIS — T148 Other injury of unspecified body region: Secondary | ICD-10-CM | POA: Diagnosis not present

## 2015-06-24 DIAGNOSIS — G9340 Encephalopathy, unspecified: Secondary | ICD-10-CM | POA: Diagnosis not present

## 2015-06-24 DIAGNOSIS — Z66 Do not resuscitate: Secondary | ICD-10-CM | POA: Diagnosis present

## 2015-06-24 DIAGNOSIS — M1990 Unspecified osteoarthritis, unspecified site: Secondary | ICD-10-CM | POA: Diagnosis not present

## 2015-06-24 DIAGNOSIS — I129 Hypertensive chronic kidney disease with stage 1 through stage 4 chronic kidney disease, or unspecified chronic kidney disease: Secondary | ICD-10-CM | POA: Diagnosis not present

## 2015-06-24 DIAGNOSIS — K219 Gastro-esophageal reflux disease without esophagitis: Secondary | ICD-10-CM | POA: Diagnosis not present

## 2015-06-24 DIAGNOSIS — R279 Unspecified lack of coordination: Secondary | ICD-10-CM | POA: Diagnosis not present

## 2015-06-24 DIAGNOSIS — G8929 Other chronic pain: Secondary | ICD-10-CM | POA: Diagnosis not present

## 2015-06-24 DIAGNOSIS — K210 Gastro-esophageal reflux disease with esophagitis: Secondary | ICD-10-CM | POA: Diagnosis not present

## 2015-06-24 LAB — CBC WITH DIFFERENTIAL/PLATELET
BASOS ABS: 0 10*3/uL (ref 0.0–0.1)
BASOS PCT: 0 %
EOS ABS: 0 10*3/uL (ref 0.0–0.7)
Eosinophils Relative: 0 %
HEMATOCRIT: 38 % — AB (ref 39.0–52.0)
Hemoglobin: 12.1 g/dL — ABNORMAL LOW (ref 13.0–17.0)
Lymphocytes Relative: 16 %
Lymphs Abs: 1.2 10*3/uL (ref 0.7–4.0)
MCH: 30.6 pg (ref 26.0–34.0)
MCHC: 31.8 g/dL (ref 30.0–36.0)
MCV: 96.2 fL (ref 78.0–100.0)
MONO ABS: 0.8 10*3/uL (ref 0.1–1.0)
Monocytes Relative: 10 %
NEUTROS ABS: 5.8 10*3/uL (ref 1.7–7.7)
NEUTROS PCT: 74 %
Platelets: 125 10*3/uL — ABNORMAL LOW (ref 150–400)
RBC: 3.95 MIL/uL — ABNORMAL LOW (ref 4.22–5.81)
RDW: 14.1 % (ref 11.5–15.5)
WBC: 7.9 10*3/uL (ref 4.0–10.5)

## 2015-06-24 LAB — BASIC METABOLIC PANEL
ANION GAP: 9 (ref 5–15)
BUN: 36 mg/dL — ABNORMAL HIGH (ref 6–20)
CALCIUM: 8.7 mg/dL — AB (ref 8.9–10.3)
CO2: 27 mmol/L (ref 22–32)
Chloride: 104 mmol/L (ref 101–111)
Creatinine, Ser: 1.54 mg/dL — ABNORMAL HIGH (ref 0.61–1.24)
GFR, EST AFRICAN AMERICAN: 46 mL/min — AB (ref >60)
GFR, EST NON AFRICAN AMERICAN: 40 mL/min — AB (ref >60)
GLUCOSE: 109 mg/dL — AB (ref 65–99)
Potassium: 4.8 mmol/L (ref 3.5–5.1)
Sodium: 140 mmol/L (ref 135–145)

## 2015-06-24 LAB — TYPE AND SCREEN
ABO/RH(D): A POS
Antibody Screen: NEGATIVE

## 2015-06-24 LAB — PROTIME-INR
INR: 1.17 (ref 0.00–1.49)
PROTHROMBIN TIME: 15.1 s (ref 11.6–15.2)

## 2015-06-24 MED ORDER — NITROGLYCERIN 0.4 MG SL SUBL: 0.4000 mg | SUBLINGUAL_TABLET | SUBLINGUAL | Status: DC | PRN

## 2015-06-24 MED ORDER — QUETIAPINE FUMARATE 25 MG PO TABS
25.0000 mg | ORAL_TABLET | Freq: Every day | ORAL | Status: DC
  Administered 2015-06-25 – 2015-06-26 (×2): 25 mg via ORAL
  Filled 2015-06-24 (×4): qty 1

## 2015-06-24 MED ORDER — MELATONIN 5 MG/15ML PO LIQD: 9.0000 mg | Freq: Every day | ORAL | Status: DC

## 2015-06-24 MED ORDER — POLYETHYLENE GLYCOL 3350 17 G PO PACK: 17.0000 g | PACK | Freq: Every day | ORAL | Status: DC | PRN

## 2015-06-24 MED ORDER — METOPROLOL TARTRATE 12.5 MG HALF TABLET
12.5000 mg | ORAL_TABLET | Freq: Two times a day (BID) | ORAL | Status: DC
  Administered 2015-06-25 – 2015-06-27 (×3): 12.5 mg via ORAL
  Filled 2015-06-24 (×4): qty 1

## 2015-06-24 MED ORDER — ALUM & MAG HYDROXIDE-SIMETH 200-200-20 MG/5ML PO SUSP: 30.0000 mL | Freq: Four times a day (QID) | ORAL | Status: DC | PRN

## 2015-06-24 MED ORDER — SODIUM CHLORIDE 0.9 % IV SOLN
INTRAVENOUS | Status: DC
  Administered 2015-06-24: 16:00:00 via INTRAVENOUS

## 2015-06-24 MED ORDER — ASPIRIN EC 81 MG PO TBEC
81.0000 mg | DELAYED_RELEASE_TABLET | Freq: Every day | ORAL | Status: DC
  Administered 2015-06-26 – 2015-06-27 (×2): 81 mg via ORAL
  Filled 2015-06-24 (×2): qty 1

## 2015-06-24 MED ORDER — IPRATROPIUM-ALBUTEROL 0.5-2.5 (3) MG/3ML IN SOLN
3.0000 mL | Freq: Four times a day (QID) | RESPIRATORY_TRACT | Status: DC
  Administered 2015-06-25 – 2015-06-26 (×3): 3 mL via RESPIRATORY_TRACT
  Filled 2015-06-24 (×3): qty 3

## 2015-06-24 MED ORDER — ACETAMINOPHEN 325 MG PO TABS
650.0000 mg | ORAL_TABLET | Freq: Four times a day (QID) | ORAL | Status: DC | PRN
  Administered 2015-06-25 – 2015-06-26 (×2): 650 mg via ORAL
  Filled 2015-06-24 (×2): qty 2

## 2015-06-24 MED ORDER — ONDANSETRON HCL 4 MG PO TABS: 4.0000 mg | ORAL_TABLET | Freq: Four times a day (QID) | ORAL | Status: DC | PRN

## 2015-06-24 MED ORDER — HYDROMORPHONE HCL 1 MG/ML IJ SOLN
0.5000 mg | INTRAMUSCULAR | Status: DC | PRN
  Administered 2015-06-24 – 2015-06-25 (×2): 0.5 mg via INTRAVENOUS
  Filled 2015-06-24 (×2): qty 1

## 2015-06-24 MED ORDER — ACETAMINOPHEN 650 MG RE SUPP
650.0000 mg | Freq: Four times a day (QID) | RECTAL | Status: DC | PRN
Start: 2015-06-24 — End: 2015-06-27

## 2015-06-24 MED ORDER — SODIUM CHLORIDE 0.9 % IV SOLN
INTRAVENOUS | Status: DC
  Administered 2015-06-24: 19:00:00 via INTRAVENOUS

## 2015-06-24 MED ORDER — ONDANSETRON HCL 4 MG/2ML IJ SOLN: 4.0000 mg | Freq: Four times a day (QID) | INTRAMUSCULAR | Status: DC | PRN

## 2015-06-24 MED ORDER — AMOXICILLIN-POT CLAVULANATE 500-125 MG PO TABS
1.0000 | ORAL_TABLET | Freq: Two times a day (BID) | ORAL | Status: DC
  Administered 2015-06-25 – 2015-06-27 (×4): 500 mg via ORAL
  Filled 2015-06-24 (×7): qty 1

## 2015-06-24 MED ORDER — DOCUSATE SODIUM 100 MG PO CAPS
100.0000 mg | ORAL_CAPSULE | Freq: Two times a day (BID) | ORAL | Status: DC
  Administered 2015-06-25 – 2015-06-27 (×4): 100 mg via ORAL
  Filled 2015-06-24 (×3): qty 1

## 2015-06-24 MED ORDER — SERTRALINE HCL 50 MG PO TABS
50.0000 mg | ORAL_TABLET | Freq: Every day | ORAL | Status: DC
  Administered 2015-06-26 – 2015-06-27 (×2): 50 mg via ORAL
  Filled 2015-06-24 (×2): qty 1

## 2015-06-24 MED ORDER — ALBUTEROL SULFATE (2.5 MG/3ML) 0.083% IN NEBU
2.5000 mg | INHALATION_SOLUTION | RESPIRATORY_TRACT | Status: DC | PRN
Start: 2015-06-24 — End: 2015-06-27
  Administered 2015-06-24 – 2015-06-27 (×3): 2.5 mg via RESPIRATORY_TRACT
  Filled 2015-06-24 (×3): qty 3

## 2015-06-24 NOTE — ED Notes (Signed)
Pt reported to have fallen this am at the facility. C/O R hip pain. Pt has hx of dementia.

## 2015-06-24 NOTE — Progress Notes (Signed)
Patient ID: TEGH BRYS, male   DOB: 1929-03-09, 82 y.o.   MRN: [id number] I have reviewed images on this patient.   He has a bases cervical femoral neck fracture.  He will need treatment with intramedullary rod and compression screw.  Given the hour and transport and need for medical clearance, I will plan on surgical intervention on this patient tomorrow around 1 PM.  I will write a full consult once I had an opportunity to evaluate the patient.  If there are issues I can be contacted at [phone].

## 2015-06-24 NOTE — Progress Notes (Signed)
[facility] hospitalist progress note. Chief complaint. Transfer note. History of present illness. This 82 year old male with prior history of Alzheimer's type dementia, chronic kidney disease, prior stroke, coronary artery disease, hypertension, and depression. He presented to [facility] after a fall. The patient was found to have a right femoral trochanteric fracture. Surgery was contacted and requested patient be sent and transferred to [facility]. The patient is now arrived in transfer and I'm seeing him at bedside to ensure he remains clinically stable and that his orders transferred appropriately. The patient is quite demented and a poor historian. He has no evidence of distress. Physical exam. Vital signs. Temperature 98.2, pulse 76, respiration 15, blood pressure 112/65. O2 sats 93%. General appearance. Well-developed elderly male who is alert and in no distress. Cardiac. Rate and rhythm regular. Lungs. Breath sounds are clear but reduced in the bases. Mild expiratory wheezes in the upper airway. No distress and appears stable on room air. Abdomen. Soft with positive bowel sounds. Extremity. Pedal pulses intact. Capillary refill intact. Impression/plan. Problem #1. Right trochanteric hip fracture. Surgical team aware of patient with plans to proceed with surgical repair of the right hip. She nothing by mouth with CBC in the morning. Problem #2. Recent community-acquired pneumonia. Patient continues on Augmentin. I've added albuterol nebulizer for mild expiratory wheezing. Problem #3. Hypertension. Continue home meds. Problem #4. Coronary artery disease with prior stents. Restart Plavix postop. Continue aspirin for now. Problem #5. Chronic kidney disease. We'll follow for renal function per a.m. labs. Problem #6. Dementia. Currently stable. The patient appears stable post transfer. All orders appear to of transferred appropriately.

## 2015-06-24 NOTE — H&P (Signed)
History and Physical  David Collier [id number] DOB: [birth year] DOA: 06/24/2015  Referring physician: Dr Vanita Panda, ED physician PCP: [name], MD   Chief Complaint: Lytle Michaels  HPI: David Collier is a 82 y.o. male  With a history of Alzheimer's, chronic kidney disease stage III, history of stroke, coronary arteries disease with angioplasty and stents on Plavix and aspirin, history of MI 5 in the 23s, hypertension, depression. Patient is a resident at a nursing facility due to his dementia. His wife is his power of attorney.  History is obtained by the family who relays that the patient fell earlier today. The patient was then placed in a wheelchair and did not ambulate for the remainder of the day. Patient was supposed to have x-rays from mobile x-ray unit, however as this was not done, the patient was brought to the emergency department for evaluation. Patient complains of discomfort when attempting to ambulate and is fairly comfortable at rest. Pain is on his right hip and is nonradiating. The patient was previously ambulatory before his fall.   Review of Systems:   Unable to retain due to Alzheimer's disease  Past Medical History  Diagnosis Date  . Hyperlipidemia   . Coronary atherosclerosis of native coronary artery     BMS circumflex and OM 2006, PTCA ISR 2010, moderate LAD disease  . DJD (degenerative joint disease)   . Macular degeneration   . Alzheimer's dementia   . CKD (chronic kidney disease), stage III   . History of stroke     Seen radiographically-left PICA; unknown to patient.  Marland Kitchen TIA (transient ischemic attack) 03/05/2014  . 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 CAD (coronary artery disease), native coronary artery     Initially diagnosed 2003 2006 catheterization with bare metal stenting of the first marginal branch of the circumflex and continuation branch of the circumflex 2010 catheterization showing normal left main, 50-60% LAD unchanged  from before, proximal stent of marginal patent, severe in-stent restenosis treated with cutting balloon angioplasty, patent stent in continuation branch with mild in-stent restenosis, occluded nondominant right coronary artery.   . Myocardial infarction ([place])     x5    1980s  sees DR. MICHAEL COOPER  . Anginal pain (HCC)     NONE IN 1 MONTH   . Hypertension   . Stroke ([place])   . Depression   . Headache    Past Surgical History  Procedure Laterality Date  . Appendectomy    . Cardiac catheterization      3 STENTS  AT  [place]  80S OR 90S  . Coronary angioplasty    . Cholecystectomy    . Multiple extractions with alveoloplasty N/A 10/22/2014    Procedure: MULTIPLE EXTRACTIONS  # 4,6, 7, 8, 10, 21, 22, 23, 24, 25, 26, 27, 28 and ALVEOLOPLASTY;  Surgeon: Diona Browner, DDS;  [facility];  Service: Oral Surgery;  Laterality: N/A;   Social History:  reports that he quit smoking about 49 years ago. His smoking use included Cigarettes. He has never used smokeless tobacco. He reports that he does not drink alcohol or use illicit drugs. Patient lives at nursing facility  & was previously ambulatory  Allergies  Allergen Reactions  . Aricept [Donepezil Hcl] Other (See Comments)    Altered mental status  . Ativan [Lorazepam] Other (See Comments)    Altered mental status  . Atorvastatin Nausea And Vomiting and Other (See Comments)    Joint pain  . Namenda [Memantine] Other (  See Comments)    Altered mental status  . Other     Wife says pt was given 2 different medications for his nerves but said they made him have SI.  Marland Kitchen Simvastatin Nausea And Vomiting and Other (See Comments)    Joint pain  . Sorbitan Nausea And Vomiting    headache    Family History  Problem Relation Age of Onset  . Heart failure       Prior to Admission medications   Medication Sig Start Date End Date Taking? Authorizing Provider  clopidogrel (PLAVIX) 75 MG tablet  04/25/09  Yes Historical Provider, MD    amoxicillin-clavulanate (AUGMENTIN) 500-125 MG tablet Take 1 tablet by mouth 2 (two) times daily. 5 day course starting on 06/22/2015 06/21/15   Historical Provider, MD  Ascorbic Acid (VITAMIN C) 500 MG tablet Take 500 mg by mouth daily.      Historical Provider, MD  aspirin EC 81 MG tablet Take 2 tablets (162 mg total) by mouth daily. Patient taking differently: Take 81 mg by mouth daily.  03/06/14   Rexene Alberts, MD  citalopram (CELEXA) 20 MG tablet Take 20 mg by mouth daily. 02/10/15   Historical Provider, MD  clonazePAM (KLONOPIN) 0.5 MG tablet Take 0.5 mg by mouth 2 (two) times daily. 02/10/15   Historical Provider, MD  Coenzyme Q10 (COQ10) 100 MG CAPS Take 1 capsule by mouth daily.     Historical Provider, MD  divalproex (DEPAKOTE ER) 500 MG 24 hr tablet  06/11/15   Historical Provider, MD  ibuprofen (ADVIL,MOTRIN) 200 MG tablet Take 2 tablets (400 mg total) by mouth every 6 (six) hours as needed for headache. Patient taking differently: Take 400-800 mg by mouth every 6 (six) hours as needed for headache.  03/06/14   Rexene Alberts, MD  ibuprofen (ADVIL,MOTRIN) 400 MG tablet  06/24/15   Historical Provider, MD  LORazepam (ATIVAN) 0.5 MG tablet Take 1 tablet by mouth daily. 06/14/15   Historical Provider, MD  meclizine (ANTIVERT) 12.5 MG tablet Take 1 tablet (12.5 mg total) by mouth 3 (three) times daily as needed for dizziness. 03/06/14   Rexene Alberts, MD  Melatonin 5 MG/15ML LIQD Take 9 mg by mouth at bedtime.     Historical Provider, MD  methocarbamol (ROBAXIN) 500 MG tablet Take 1 tablet by mouth 4 (four) times daily as needed. For spasms 05/16/15   Historical Provider, MD  metoprolol (LOPRESSOR) 25 MG tablet Take 1 tablet (25 mg total) by mouth daily. Patient taking differently: Take 12.5 mg by mouth 2 (two) times daily.  07/12/14   Rhonda G Barrett, PA-C  NITROSTAT 0.4 MG SL tablet DISSOLVE ONE TABLET UNDER THE TONGUE EVERY 5 MINUTES AS NEEDED FOR CHEST PAIN 04/12/14   [name], MD   QUEtiapine (SEROQUEL) 25 MG tablet  06/10/15   Historical Provider, MD  Red Yeast Rice 600 MG CAPS Take 1 capsule by mouth every morning.     Historical Provider, MD  sertraline (ZOLOFT) 50 MG tablet  05/28/15   Historical Provider, MD  traMADol (ULTRAM) 50 MG tablet Take 50 mg by mouth 3 (three) times daily as needed for moderate pain.    Historical Provider, MD    Physical Exam: BP 118/66 mmHg  Pulse 60  Temp(Src) 97.9 F (36.6 C)  Resp 16  Ht 5\' 8"  (1.727 m)  Wt 90.719 kg (200 lb)  BMI 30.42 kg/m2  SpO2 94%  General: [place] Caucasian male. Awake and alert and oriented x3. No acute cardiopulmonary  distress.  Eyes: Pupils equal, round, reactive to light. Extraocular muscles are intact. Sclerae anicteric and noninjected.  ENT:  Moist mucosal membranes. No mucosal lesions.   Neck: Neck supple without lymphadenopathy. No carotid bruits. No masses palpated.  Cardiovascular: Regular rate with normal S1-S2 sounds. No murmurs, rubs, gallops auscultated. No JVD.  Respiratory:  mild right lower rhonchi. Good respiratory effort.  No rales. Slight wheeze.   Abdomen: Soft, nontender, nondistended. Active bowel sounds. No masses or hepatosplenomegaly  Skin: Dry, warm to touch. 2+ dorsalis pedis and radial pulses. Musculoskeletal: No calf or leg pain. Right lower extremity externally rotated and slightly longer than left.   Psychiatric: Intact judgment and insight.  Neurologic: No focal neurological deficits. Cranial nerves II through XII are grossly intact.           Labs on Admission:  Basic Metabolic Panel:  Recent Labs Lab 06/24/15 1610  NA 140  K 4.8  CL 104  CO2 27  GLUCOSE 109*  BUN 36*  CREATININE 1.54*  CALCIUM 8.7*   Liver Function Tests: No results for input(s): AST, ALT, ALKPHOS, BILITOT, PROT, ALBUMIN in the last 168 hours. No results for input(s): LIPASE, AMYLASE in the last 168 hours. No results for input(s): AMMONIA in the last 168 hours. CBC:  Recent Labs Lab  06/24/15 1610  WBC 7.9  NEUTROABS 5.8  HGB 12.1*  HCT 38.0*  MCV 96.2  PLT 125*   Cardiac Enzymes: No results for input(s): CKTOTAL, CKMB, CKMBINDEX, TROPONINI in the last 168 hours.  BNP (last 3 results)  Recent Labs  07/11/14 1958  BNP 82.1    ProBNP (last 3 results) No results for input(s): PROBNP in the last 8760 hours.  CBG: No results for input(s): GLUCAP in the last 168 hours.  Radiological Exams on Admission: Dg Chest 1 View  06/24/2015  CLINICAL DATA:  Fall.  Right hip pain. EXAM: CHEST 1 VIEW COMPARISON:  03/04/2015 FINDINGS: The heart size and mediastinal contours are within normal limits. Clear lungs.  No pleural effusion or pneumothorax. Bony thorax is demineralized.  There are old rib fractures. IMPRESSION: No active disease. Electronically Signed   By: Lajean Manes M.D.   On: 06/24/2015 17:07   Ct Cervical Spine Wo Contrast  06/24/2015  CLINICAL DATA:  Fall at facility this am. Hx of Alzheimer's disease, Stroke, HTN. EXAM: CT CERVICAL SPINE WITHOUT CONTRAST TECHNIQUE: Multidetector CT imaging of the cervical spine was performed without intravenous contrast. Multiplanar CT image reconstructions were also generated. COMPARISON:  None. FINDINGS: No fracture.  No spondylolisthesis. Mild to moderate loss of disc height at C5-C6 with moderate loss of disc height at C6-C7. Uncovertebral spurring leads to moderate left and mild right neural foraminal narrowing at these levels. There is facet degenerative change most evident on the right at C3-C4. There is moderate neural foraminal narrowing at this level on the right. Bones are demineralized. Soft tissues are unremarkable. Lung apices are clear.  Heart IMPRESSION: 1. No fracture or acute finding. 2. Degenerative changes as detailed. Electronically Signed   By: Lajean Manes M.D.   On: 06/24/2015 16:54   Dg Hip Unilat With Pelvis 2-3 Views Right  06/24/2015  CLINICAL DATA:  Fall, right hip pain EXAM: DG HIP (WITH OR WITHOUT  PELVIS) 2-3V RIGHT COMPARISON:  None. FINDINGS: There is varus angulation of the proximal right femur. There is an intertrochanteric right femur fracture with mild displacement of fracture fragments. No other fractures identified. IMPRESSION: Acute intertrochanteric right femur fracture Electronically  Signed   By: Skipper Cliche M.D.   On: 06/24/2015 17:09    EKG:  independently reviewed. Sinus rhythm with slightly prolonged QRS at 0.135 and a PR interval of 0.21. Right bundle branch block. No acute ST elevation or depression.  Assessment/Plan Present on Admission:  . Trochanteric fracture of right femur ([place]) . Essential hypertension . CAD (coronary artery disease), native coronary artery . CKD (chronic kidney disease), stage III . Dementia . Community acquired pneumonia  This patient was discussed with the ED physician, including pertinent vitals, physical exam findings, labs, and imaging.  We also discussed care given by the ED provider.  #1 Trochanteric hip fracture  We'll transfer for the patient to [facility] and he will be admitted and undergo surgery  Surgical team aware of recent diagnosis of pneumonia   Dilaudid for pain control  - we'll be cautious with dose   Nothing by mouth   CBC in the morning  #2 recent community acquired pneumonia   continue Augmentin - today is day 3 of 5.   The certain to give incentive spirometry if patient is able to perform to help prevent recurrence  #3 essential hypertension   continue home medications  #4 coronary artery disease with stents  restart Plavix as soon as possible  Continue aspirin #5 chronic kidney disease   will obtain creatinine in the morning #6 dementia    will be cautious with sedating medications    DVT prophylaxis: SCDs for now as the patient may go for surgery tonight   Consultants: orthopedics   Code Status: DO NOT RESUSCITATE   Family Communication: I spoke with his wife on the phone    Disposition Plan: admission to [facility], [place] [facility] Pager [phone]

## 2015-06-24 NOTE — ED Notes (Signed)
Pt transported to [facility] via [facility].  

## 2015-06-24 NOTE — Progress Notes (Signed)
PHARMACIST - PHYSICIAN ORDER COMMUNICATION  CONCERNING: P&T Medication Policy on Herbal Medications  DESCRIPTION:  This patient's order for:  Melatonin liquid has been noted.  This product(s) is classified as an "herbal" or natural product. Due to a lack of definitive safety studies or FDA approval, nonstandard manufacturing practices, plus the potential risk of unknown drug-drug interactions while on inpatient medications, the Pharmacy and Therapeutics Committee does not permit the use of "herbal" or natural products of this type within [facility].   ACTION TAKEN: The pharmacy department is unable to verify this order at this time and your patient has been informed of this safety policy. Please reevaluate patient's clinical condition at discharge and address if the herbal or natural product(s) should be resumed at that time.

## 2015-06-24 NOTE — ED Provider Notes (Addendum)
CSN: [id number]     Arrival date & time 06/24/15  1540 History   First MD Initiated Contact with Patient 06/24/15 1546     Chief Complaint  Patient presents with  . Fall    HPI Patient presents from his nursing facility after a fall. Patient has dementia, level V caveat. Patient answers no questions properly, but does seem to indicate pain about the right focally. Per report, the patient had a fall from his chair about 11 hours ago. The patient was placed in his wheelchair, but after he had change from his typical behavior, that lasted the entirety of the day, he was brought here for evaluation. No report of vomiting, additional fall, recent medication changes. No medication provided at the nursing facility. Patient himself responds only to occasional questions, inconsistently, briefly.  Patient did reportedly complained of neck pain after the fall.  Past Medical History  Diagnosis Date  . Hyperlipidemia   . Coronary atherosclerosis of native coronary artery     BMS circumflex and OM 2006, PTCA ISR 2010, moderate LAD disease  . DJD (degenerative joint disease)   . Macular degeneration   . Alzheimer's dementia   . CKD (chronic kidney disease), stage III   . History of stroke     Seen radiographically-left PICA; unknown to patient.  Marland Kitchen TIA (transient ischemic attack) 03/05/2014  . 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 CAD (coronary artery disease), native coronary artery     Initially diagnosed 2003 2006 catheterization with bare metal stenting of the first marginal branch of the circumflex and continuation branch of the circumflex 2010 catheterization showing normal left main, 50-60% LAD unchanged from before, proximal stent of marginal patent, severe in-stent restenosis treated with cutting balloon angioplasty, patent stent in continuation branch with mild in-stent restenosis, occluded nondominant right coronary artery.   . Myocardial infarction ([place])     x5    1980s   sees DR. MICHAEL COOPER  . Anginal pain (HCC)     NONE IN 1 MONTH   . Hypertension   . Stroke ([place])   . Depression   . Headache    Past Surgical History  Procedure Laterality Date  . Appendectomy    . Cardiac catheterization      3 STENTS  AT  [place]  80S OR 90S  . Coronary angioplasty    . Cholecystectomy    . Multiple extractions with alveoloplasty N/A 10/22/2014    Procedure: MULTIPLE EXTRACTIONS  # 4,6, 7, 8, 10, 21, 22, 23, 24, 25, 26, 27, 28 and ALVEOLOPLASTY;  Surgeon: Diona Browner, DDS;  Location: [facility];  Service: Oral Surgery;  Laterality: N/A;   Family History  Problem Relation Age of Onset  . Heart failure     Social History  Substance Use Topics  . Smoking status: Former Smoker    Types: Cigarettes    Quit date: 04/23/1966  . Smokeless tobacco: Never Used  . Alcohol Use: No    Review of Systems  Unable to perform ROS: Dementia      Allergies  Aricept; Ativan; Atorvastatin; Namenda; Other; Simvastatin; and Sorbitan  Home Medications   Prior to Admission medications   Medication Sig Start Date End Date Taking? Authorizing Provider  amoxicillin (AMOXIL) 500 MG capsule Take 1 capsule (500 mg total) by mouth 3 (three) times daily. Patient not taking: Reported on 01/20/2015 10/22/14   Diona Browner, DDS  Ascorbic Acid (VITAMIN C) 500 MG tablet Take 500 mg by mouth daily.  Historical Provider, MD  aspirin EC 81 MG tablet Take 2 tablets (162 mg total) by mouth daily. Patient taking differently: Take 81 mg by mouth daily.  03/06/14   Rexene Alberts, MD  citalopram (CELEXA) 20 MG tablet Take 20 mg by mouth daily. 02/10/15   Historical Provider, MD  clonazePAM (KLONOPIN) 0.5 MG tablet Take 0.5 mg by mouth 2 (two) times daily. 02/10/15   Historical Provider, MD  Coenzyme Q10 (COQ10) 100 MG CAPS Take 1 capsule by mouth daily.     Historical Provider, MD  ibuprofen (ADVIL,MOTRIN) 200 MG tablet Take 2 tablets (400 mg total) by mouth every 6 (six) hours as needed for  headache. Patient taking differently: Take 400-800 mg by mouth every 6 (six) hours as needed for headache.  03/06/14   Rexene Alberts, MD  isosorbide mononitrate (IMDUR) 30 MG 24 hr tablet Take 0.5 tablets (15 mg total) by mouth daily. Patient not taking: Reported on 01/20/2015 07/12/14   Evelene Croon Barrett, PA-C  meclizine (ANTIVERT) 12.5 MG tablet Take 1 tablet (12.5 mg total) by mouth 3 (three) times daily as needed for dizziness. 03/06/14   Rexene Alberts, MD  Melatonin 5 MG/15ML LIQD Take 9 mg by mouth at bedtime.     Historical Provider, MD  metoprolol (LOPRESSOR) 25 MG tablet Take 1 tablet (25 mg total) by mouth daily. Patient taking differently: Take 12.5 mg by mouth 2 (two) times daily.  07/12/14   Rhonda G Barrett, PA-C  NITROSTAT 0.4 MG SL tablet DISSOLVE ONE TABLET UNDER THE TONGUE EVERY 5 MINUTES AS NEEDED FOR CHEST PAIN 04/12/14   [name], MD  oxyCODONE-acetaminophen (PERCOCET) 5-325 MG per tablet Take 1-2 tablets by mouth every 4 (four) hours as needed for severe pain. Patient not taking: Reported on 01/20/2015 10/22/14   Diona Browner, DDS  Red Yeast Rice 600 MG CAPS Take 1 capsule by mouth every morning.     Historical Provider, MD  traMADol (ULTRAM) 50 MG tablet Take 50 mg by mouth 3 (three) times daily as needed for moderate pain.    Historical Provider, MD   BP 104/78 mmHg  Pulse 62  Temp(Src) 98 F (36.7 C)  Resp 18  Ht 5\' 8"  (1.727 m)  Wt 200 lb (90.719 kg)  BMI 30.42 kg/m2  SpO2 97% Physical Exam  Constitutional: He appears well-developed. No distress.  Elderly male resting supine, no clear distress  HENT:  Head: Normocephalic and atraumatic.  Eyes: Conjunctivae and EOM are normal.  Cardiovascular: Normal rate and regular rhythm.   Pulmonary/Chest: Effort normal. No stridor. No respiratory distress.  Abdominal: He exhibits no distension.  Musculoskeletal: He exhibits no edema.       Legs: Neurological: He is alert. He displays atrophy.  Patient moves all  extremities spontaneously, minimally in the right lower extremity. Patient has very brief speech, and consistent, but clear. No facial asymmetry.  Skin: Skin is warm and dry.  Psychiatric: He is withdrawn. Cognition and memory are impaired.  Nursing note and vitals reviewed.   ED Course  Procedures (including critical care time) Labs Review Labs Reviewed  BASIC METABOLIC PANEL - Abnormal; Notable for the following:    Glucose, Bld 109 (*)    BUN 36 (*)    Creatinine, Ser 1.54 (*)    Calcium 8.7 (*)    GFR calc non Af Amer 40 (*)    GFR calc Af Amer 46 (*)    All other components within normal limits  CBC WITH DIFFERENTIAL/PLATELET - Abnormal;  Notable for the following:    RBC 3.95 (*)    Hemoglobin 12.1 (*)    HCT 38.0 (*)    Platelets 125 (*)    All other components within normal limits  PROTIME-INR  TYPE AND SCREEN    Imaging Review Dg Chest 1 View  06/24/2015  CLINICAL DATA:  Fall.  Right hip pain. EXAM: CHEST 1 VIEW COMPARISON:  03/04/2015 FINDINGS: The heart size and mediastinal contours are within normal limits. Clear lungs.  No pleural effusion or pneumothorax. Bony thorax is demineralized.  There are old rib fractures. IMPRESSION: No active disease. Electronically Signed   By: Lajean Manes M.D.   On: 06/24/2015 17:07   Ct Cervical Spine Wo Contrast  06/24/2015  CLINICAL DATA:  Fall at facility this am. Hx of Alzheimer's disease, Stroke, HTN. EXAM: CT CERVICAL SPINE WITHOUT CONTRAST TECHNIQUE: Multidetector CT imaging of the cervical spine was performed without intravenous contrast. Multiplanar CT image reconstructions were also generated. COMPARISON:  None. FINDINGS: No fracture.  No spondylolisthesis. Mild to moderate loss of disc height at C5-C6 with moderate loss of disc height at C6-C7. Uncovertebral spurring leads to moderate left and mild right neural foraminal narrowing at these levels. There is facet degenerative change most evident on the right at C3-C4. There is  moderate neural foraminal narrowing at this level on the right. Bones are demineralized. Soft tissues are unremarkable. Lung apices are clear.  Heart IMPRESSION: 1. No fracture or acute finding. 2. Degenerative changes as detailed. Electronically Signed   By: Lajean Manes M.D.   On: 06/24/2015 16:54   Dg Hip Unilat With Pelvis 2-3 Views Right  06/24/2015  CLINICAL DATA:  Fall, right hip pain EXAM: DG HIP (WITH OR WITHOUT PELVIS) 2-3V RIGHT COMPARISON:  None. FINDINGS: There is varus angulation of the proximal right femur. There is an intertrochanteric right femur fracture with mild displacement of fracture fragments. No other fractures identified. IMPRESSION: Acute intertrochanteric right femur fracture Electronically Signed   By: Skipper Cliche M.D.   On: 06/24/2015 17:09   I have personally reviewed and evaluated these images and lab results as part of my medical decision-making.   Dr. Berenice Primas' team is aware of the patient. MDM   Final diagnoses:  Fall  R intertrochanteric fracture   This elderly M presents from his NH several hours after a fall.  He is found to have a right intertrochanteric fracture.  The remainder of his radiographic studies are largely reassuring. Patient was previously ambulatory, and with need for operative repair, I arranged transfer to our affiliated center after discussing his case with our orthopedic and hospitalist colleagues.    Carmin Muskrat, MD 06/24/15 [place], MD 06/24/15 [phone]

## 2015-06-24 NOTE — ED Notes (Signed)
Pt was dx with Pneumonia 06/21/15,  Augmentin 500/125mg , 1 tab po twice a day ordered. First dose given 06/22/15 am, pt received 5th dose this am.

## 2015-06-25 ENCOUNTER — Encounter (HOSPITAL_COMMUNITY)
Admission: EM | Disposition: A | Discharge status: Skilled Nursing Facility | Payer: Self-pay | Source: Home / Self Care | Attending: Internal Medicine

## 2015-06-25 ENCOUNTER — Inpatient Hospital Stay (HOSPITAL_COMMUNITY): Payer: Medicare Other

## 2015-06-25 ENCOUNTER — Inpatient Hospital Stay (HOSPITAL_COMMUNITY): Payer: Medicare Other | Admitting: Anesthesiology

## 2015-06-25 DIAGNOSIS — I25118 Atherosclerotic heart disease of native coronary artery with other forms of angina pectoris: Secondary | ICD-10-CM

## 2015-06-25 HISTORY — PX: INTRAMEDULLARY (IM) NAIL INTERTROCHANTERIC: SHX5875

## 2015-06-25 LAB — SURGICAL PCR SCREEN
MRSA, PCR: NEGATIVE
STAPHYLOCOCCUS AUREUS: NEGATIVE

## 2015-06-25 SURGERY — FIXATION, FRACTURE, INTERTROCHANTERIC, WITH INTRAMEDULLARY ROD
Anesthesia: General | Laterality: Right

## 2015-06-25 MED ORDER — CEFAZOLIN SODIUM-DEXTROSE 2-3 GM-% IV SOLR
2.0000 g | INTRAVENOUS | Status: AC
  Administered 2015-06-25: 2 g via INTRAVENOUS
  Filled 2015-06-25: qty 50

## 2015-06-25 MED ORDER — SODIUM CHLORIDE 0.9 % IV SOLN
INTRAVENOUS | Status: DC
  Administered 2015-06-25 (×2): via INTRAVENOUS

## 2015-06-25 MED ORDER — PROMETHAZINE HCL 25 MG/ML IJ SOLN: 6.2500 mg | INTRAMUSCULAR | Status: DC | PRN

## 2015-06-25 MED ORDER — FENTANYL CITRATE (PF) 100 MCG/2ML IJ SOLN
INTRAMUSCULAR | Status: DC | PRN
  Administered 2015-06-25: 50 ug via INTRAVENOUS
  Administered 2015-06-25 (×2): 25 ug via INTRAVENOUS

## 2015-06-25 MED ORDER — CHLORHEXIDINE GLUCONATE 4 % EX LIQD
60.0000 mL | Freq: Once | CUTANEOUS | Status: AC
  Administered 2015-06-25: 4 via TOPICAL
  Filled 2015-06-25: qty 60

## 2015-06-25 MED ORDER — METOPROLOL TARTRATE 1 MG/ML IV SOLN
5.0000 mg | INTRAVENOUS | Status: DC | PRN
  Filled 2015-06-25: qty 5

## 2015-06-25 MED ORDER — PHENYLEPHRINE HCL 10 MG/ML IJ SOLN
INTRAMUSCULAR | Status: DC | PRN
  Administered 2015-06-25 (×2): 80 ug via INTRAVENOUS

## 2015-06-25 MED ORDER — FENTANYL CITRATE (PF) 250 MCG/5ML IJ SOLN
INTRAMUSCULAR | Status: AC
  Filled 2015-06-25: qty 5

## 2015-06-25 MED ORDER — 0.9 % SODIUM CHLORIDE (POUR BTL) OPTIME
TOPICAL | Status: DC | PRN
  Administered 2015-06-25: 300 mL

## 2015-06-25 MED ORDER — DIVALPROEX SODIUM ER 500 MG PO TB24
750.0000 mg | ORAL_TABLET | Freq: Every day | ORAL | Status: DC
  Administered 2015-06-25 – 2015-06-26 (×2): 750 mg via ORAL
  Filled 2015-06-25 (×3): qty 1

## 2015-06-25 MED ORDER — ONDANSETRON HCL 4 MG/2ML IJ SOLN
INTRAMUSCULAR | Status: AC
  Filled 2015-06-25: qty 2

## 2015-06-25 MED ORDER — DEXTROSE 5 % IV SOLN
10.0000 mg | INTRAVENOUS | Status: DC | PRN
  Administered 2015-06-25: 25 ug/min via INTRAVENOUS

## 2015-06-25 MED ORDER — LIDOCAINE HCL (CARDIAC) 20 MG/ML IV SOLN
INTRAVENOUS | Status: AC
  Filled 2015-06-25: qty 5

## 2015-06-25 MED ORDER — FENTANYL CITRATE (PF) 100 MCG/2ML IJ SOLN: 25.0000 ug | INTRAMUSCULAR | Status: DC | PRN

## 2015-06-25 MED ORDER — EPHEDRINE SULFATE 50 MG/ML IJ SOLN
INTRAMUSCULAR | Status: DC | PRN
  Administered 2015-06-25 (×2): 10 mg via INTRAVENOUS

## 2015-06-25 MED ORDER — MIDAZOLAM HCL 2 MG/2ML IJ SOLN
INTRAMUSCULAR | Status: AC
  Filled 2015-06-25: qty 2

## 2015-06-25 MED ORDER — VITAMIN B-12 1000 MCG PO TABS
500.0000 ug | ORAL_TABLET | Freq: Every day | ORAL | Status: DC
  Administered 2015-06-26 – 2015-06-27 (×3): 500 ug via ORAL
  Filled 2015-06-25: qty 0.5
  Filled 2015-06-25 (×2): qty 1

## 2015-06-25 MED ORDER — SUCCINYLCHOLINE CHLORIDE 20 MG/ML IJ SOLN
INTRAMUSCULAR | Status: DC | PRN
  Administered 2015-06-25: 100 mg via INTRAVENOUS

## 2015-06-25 MED ORDER — LIDOCAINE HCL (CARDIAC) 20 MG/ML IV SOLN
INTRAVENOUS | Status: DC | PRN
  Administered 2015-06-25: 60 mg via INTRAVENOUS

## 2015-06-25 MED ORDER — ONDANSETRON HCL 4 MG/2ML IJ SOLN
INTRAMUSCULAR | Status: DC | PRN
  Administered 2015-06-25: 4 mg via INTRAVENOUS

## 2015-06-25 MED ORDER — CEFAZOLIN SODIUM-DEXTROSE 2-3 GM-% IV SOLR
2.0000 g | Freq: Four times a day (QID) | INTRAVENOUS | Status: AC
  Administered 2015-06-25 – 2015-06-26 (×2): 2 g via INTRAVENOUS
  Filled 2015-06-25 (×2): qty 50

## 2015-06-25 MED ORDER — PROPOFOL 10 MG/ML IV BOLUS
INTRAVENOUS | Status: AC
  Filled 2015-06-25: qty 20

## 2015-06-25 MED ORDER — LORAZEPAM 0.5 MG PO TABS
0.5000 mg | ORAL_TABLET | Freq: Every day | ORAL | Status: DC | PRN
  Administered 2015-06-26: 0.5 mg via ORAL
  Filled 2015-06-25: qty 1

## 2015-06-25 MED ORDER — ROCURONIUM BROMIDE 50 MG/5ML IV SOLN
INTRAVENOUS | Status: AC
  Filled 2015-06-25: qty 1

## 2015-06-25 MED ORDER — PROPOFOL 10 MG/ML IV BOLUS
INTRAVENOUS | Status: DC | PRN
Start: 2015-06-25 — End: 2015-06-25
  Administered 2015-06-25: 100 mg via INTRAVENOUS

## 2015-06-25 MED ORDER — HYDROCODONE-ACETAMINOPHEN 5-325 MG PO TABS: 1.0000 | ORAL_TABLET | Freq: Four times a day (QID) | ORAL | Status: DC | PRN

## 2015-06-25 MED ORDER — HYDROCODONE-ACETAMINOPHEN 5-325 MG PO TABS
1.0000 | ORAL_TABLET | Freq: Four times a day (QID) | ORAL | Status: DC | PRN
  Administered 2015-06-26 – 2015-06-27 (×3): 1 via ORAL
  Filled 2015-06-25 (×3): qty 1

## 2015-06-25 MED ORDER — MAGNESIUM CITRATE PO SOLN: 1.0000 | Freq: Once | ORAL | Status: DC | PRN

## 2015-06-25 MED ORDER — BISACODYL 5 MG PO TBEC: 5.0000 mg | DELAYED_RELEASE_TABLET | Freq: Every day | ORAL | Status: DC | PRN

## 2015-06-25 MED ORDER — FERROUS SULFATE 325 (65 FE) MG PO TABS
325.0000 mg | ORAL_TABLET | Freq: Every day | ORAL | Status: DC
  Administered 2015-06-26 – 2015-06-27 (×2): 325 mg via ORAL
  Filled 2015-06-25 (×2): qty 1

## 2015-06-25 MED ORDER — CLOPIDOGREL BISULFATE 75 MG PO TABS
75.0000 mg | ORAL_TABLET | Freq: Every day | ORAL | Status: DC
  Administered 2015-06-26 – 2015-06-27 (×2): 75 mg via ORAL
  Filled 2015-06-25 (×2): qty 1

## 2015-06-25 SURGICAL SUPPLY — 43 items
BIT DRILL 4.3MMS DISTAL GRDTED (BIT) ×1 IMPLANT
BLADE SURG ROTATE 9660 (MISCELLANEOUS) IMPLANT
COVER PERINEAL POST (MISCELLANEOUS) ×3 IMPLANT
COVER SURGICAL LIGHT HANDLE (MISCELLANEOUS) ×3 IMPLANT
DECANTER SPIKE VIAL GLASS SM (MISCELLANEOUS) IMPLANT
DRAPE STERI IOBAN 125X83 (DRAPES) ×3 IMPLANT
DRILL 4.3MMS DISTAL GRADUATED (BIT) ×3
DRSG MEPILEX BORDER 4X4 (GAUZE/BANDAGES/DRESSINGS) ×3 IMPLANT
DRSG MEPILEX BORDER 4X8 (GAUZE/BANDAGES/DRESSINGS) ×3 IMPLANT
DURAPREP 26ML APPLICATOR (WOUND CARE) ×3 IMPLANT
ELECT CAUTERY BLADE 6.4 (BLADE) ×3 IMPLANT
ELECT REM PT RETURN 9FT ADLT (ELECTROSURGICAL) ×3
ELECTRODE REM PT RTRN 9FT ADLT (ELECTROSURGICAL) ×1 IMPLANT
EVACUATOR 1/8 PVC DRAIN (DRAIN) IMPLANT
GAUZE XEROFORM 5X9 LF (GAUZE/BANDAGES/DRESSINGS) ×3 IMPLANT
GLOVE BIOGEL PI IND STRL 8 (GLOVE) ×2 IMPLANT
GLOVE BIOGEL PI INDICATOR 8 (GLOVE) ×4
GLOVE ECLIPSE 7.5 STRL STRAW (GLOVE) ×6 IMPLANT
GOWN STRL REUS W/ TWL LRG LVL3 (GOWN DISPOSABLE) ×1 IMPLANT
GOWN STRL REUS W/ TWL XL LVL3 (GOWN DISPOSABLE) ×2 IMPLANT
GOWN STRL REUS W/TWL LRG LVL3 (GOWN DISPOSABLE) ×2
GOWN STRL REUS W/TWL XL LVL3 (GOWN DISPOSABLE) ×4
GUIDEPIN 3.2X17.5 THRD DISP (PIN) ×6 IMPLANT
GUIDEWIRE BALL NOSE 100CM (WIRE) ×3 IMPLANT
HIP FRAC NAIL LAG SCR 10.5X100 (Orthopedic Implant) ×2 IMPLANT
KIT BASIN OR (CUSTOM PROCEDURE TRAY) ×3 IMPLANT
KIT ROOM TURNOVER OR (KITS) ×3 IMPLANT
LINER BOOT UNIVERSAL DISP (MISCELLANEOUS) IMPLANT
MANIFOLD NEPTUNE II (INSTRUMENTS) IMPLANT
NAIL HIP FRACTURE 11X380MM (Nail) ×3 IMPLANT
NS IRRIG 1000ML POUR BTL (IV SOLUTION) ×3 IMPLANT
PACK GENERAL/GYN (CUSTOM PROCEDURE TRAY) ×3 IMPLANT
PAD ARMBOARD 7.5X6 YLW CONV (MISCELLANEOUS) ×6 IMPLANT
SCREW BONE CORTICAL 5.0X54 (Screw) ×3 IMPLANT
SCREW CANN THRD AFF 10.5X100 (Orthopedic Implant) ×1 IMPLANT
SCREWDRIVER HEX TIP 3.5MM (MISCELLANEOUS) ×3 IMPLANT
STAPLER VISISTAT 35W (STAPLE) ×3 IMPLANT
SUT VIC AB 0 CTB1 27 (SUTURE) ×6 IMPLANT
SUT VIC AB 1 CTB1 27 (SUTURE) ×3 IMPLANT
SUT VIC AB 2-0 CTB1 (SUTURE) ×3 IMPLANT
TOWEL OR 17X24 6PK STRL BLUE (TOWEL DISPOSABLE) ×3 IMPLANT
TOWEL OR 17X26 10 PK STRL BLUE (TOWEL DISPOSABLE) ×3 IMPLANT
WATER STERILE IRR 1000ML POUR (IV SOLUTION) IMPLANT

## 2015-06-25 NOTE — Anesthesia Postprocedure Evaluation (Signed)
Anesthesia Post Note  Patient: David Collier  Procedure(s) Performed: Procedure(s) (LRB): INTRAMEDULLARY (IM) NAIL INTERTROCHANTRIC (Right)  Patient location during evaluation: PACU Anesthesia Type: General Level of consciousness: awake and alert Pain management: pain level controlled Vital Signs Assessment: post-procedure vital signs reviewed and stable Respiratory status: spontaneous breathing, nonlabored ventilation, respiratory function stable and patient connected to nasal cannula oxygen Cardiovascular status: blood pressure returned to baseline and stable Postop Assessment: no signs of nausea or vomiting Anesthetic complications: no    Last Vitals:  Filed Vitals:   06/25/15 1533 06/25/15 1555  BP: 117/70 98/63  Pulse: 89 86  Temp:    Resp: 12 14    Last Pain:  Filed Vitals:   06/25/15 1556  PainSc: Asleep                 Tiajuana Amass

## 2015-06-25 NOTE — Anesthesia Preprocedure Evaluation (Addendum)
Anesthesia Evaluation  Patient identified by MRN, date of birth, ID band Patient awake and Patient confused    Reviewed: Allergy & Precautions, NPO status , Patient's Chart, lab work & pertinent test results, reviewed documented beta blocker date and time   Airway Mallampati: II  TM Distance: >3 FB Neck ROM: Full    Dental  (+) Poor Dentition   Pulmonary pneumonia, unresolved, former smoker,    breath sounds clear to auscultation       Cardiovascular hypertension, Pt. on home beta blockers and Pt. on medications + CAD, + Past MI, + Cardiac Stents and + Peripheral Vascular Disease   Rhythm:Regular Rate:Normal  02/2014 echo: - LVEF 60-65%, mild concentric LVH, normal wall motion, diastolicdysfunction, normal LV filling pressure. Mild RV dilitation withnormal function, upper normal RA size. No significant TRdetected.    Neuro/Psych Depression Dementia TIACVA    GI/Hepatic negative GI ROS, Neg liver ROS,   Endo/Other  negative endocrine ROS  Renal/GU CRFRenal disease     Musculoskeletal  (+) Arthritis ,   Abdominal   Peds  Hematology negative hematology ROS (+)   Anesthesia Other Findings   Reproductive/Obstetrics                            Lab Results  Component Value Date   WBC 7.9 06/24/2015   HGB 12.1* 06/24/2015   HCT 38.0* 06/24/2015   MCV 96.2 06/24/2015   PLT 125* 06/24/2015   Lab Results  Component Value Date   CREATININE 1.54* 06/24/2015   BUN 36* 06/24/2015   NA 140 06/24/2015   K 4.8 06/24/2015   CL 104 06/24/2015   CO2 27 06/24/2015    Anesthesia Physical  Anesthesia Plan  ASA: III  Anesthesia Plan: General   Post-op Pain Management:    Induction: Intravenous  Airway Management Planned: Oral ETT  Additional Equipment:   Intra-op Plan:   Post-operative Plan: Extubation in OR and Possible Post-op intubation/ventilation  Informed Consent: I have  reviewed the patients History and Physical, chart, labs and discussed the procedure including the risks, benefits and alternatives for the proposed anesthesia with the patient or authorized representative who has indicated his/her understanding and acceptance.   Dental advisory given  Plan Discussed with: CRNA  Anesthesia Plan Comments:         Anesthesia Quick Evaluation

## 2015-06-25 NOTE — Transfer of Care (Signed)
Immediate Anesthesia Transfer of Care Note  Patient: David Collier  Procedure(s) Performed: Procedure(s): INTRAMEDULLARY (IM) NAIL INTERTROCHANTRIC (Right)  Patient Location: PACU  Anesthesia Type:General  Level of Consciousness: awake and alert   Airway & Oxygen Therapy: Patient Spontanous Breathing and Patient connected to nasal cannula oxygen  Post-op Assessment: Report given to RN and Post -op Vital signs reviewed and stable  Post vital signs: Reviewed and stable  Last Vitals:  Filed Vitals:   06/25/15 0937 06/25/15 1210  BP: 145/67 153/70  Pulse: 67 72  Temp:  36.9 C  Resp:  18    Complications: No apparent anesthesia complications

## 2015-06-25 NOTE — Op Note (Signed)
David Collier, FEIOCK              ACCOUNT NO.:  [id number]  MEDICAL RECORD NO.:  [id number]  LOCATION:  5N02C                        FACILITY:  [facility]  PHYSICIAN:  Alta Corning, M.D.   DATE OF BIRTH:  [birth year]  DATE OF PROCEDURE:  06/25/2015 DATE OF DISCHARGE:                              OPERATIVE REPORT   PREOPERATIVE DIAGNOSIS:  Intertrochanteric hip fracture, right.  POSTOPERATIVE DIAGNOSIS:  Intertrochanteric hip fracture, right.  PROCEDURES: 1. Open reduction and internal fixation of right intertrochanteric hip     fracture with Affixus nail, 11 x 380 mm with 100 mm compression hip     screw. 2. Interpretation of multiple intraoperative fluoroscopic images.  SURGEON:  Alta Corning, M.D.  ASSISTANT:  Gary Fleet, PA.  ANESTHESIA:  General.  BRIEF HISTORY:  [name] is an 82 year old male with a history of having a fall and suffering intertrochanteric hip fracture.  He was seen initially at the [facility] where there was not an orthopedist available to take care of him, and he was transferred down to [facility] for treatment.  Internal Medicine admitted the patient and cleared him for surgery.  He was taken operating room after a long discussion with him and the family about potential treatment options.  DESCRIPTION OF PROCEDURE:  The patient was taken to the operating room and after adequate anesthesia was obtained with general anesthetic, the patient was placed on the operating table.  The right hip was then prepped and draped in the usual sterile fashion.  He was moved onto the Hana bed, and a manipulative closed reduction was undertaken with the application of traction prior to prep and drape.  Once this was done, preoperative fluoroscopic images were taken and the alignment was chosen.  A small incision was then made just proximal to the trochanter; subcutaneous tissue down the level of the trochanter.  A guidewire was advanced down the  central portion of the canal in the AP and lateral position.  Once this was completed, the canal was open and following this, an 11 mm x 380 mm rod was placed.  This was chosen based on the length of the guidewire.  Once this was done, a small incision was made and a guidewire was used to get in the low position in the head and slightly posterior, and we advanced this up.  I was concerned about the high nature of the intertroch, and so we used a derotational guidewire for insertion of the screw.  Once the screw was inserted, we removed the derotational guidewire, took the traction off and compressed the fracture.  Excellent compression was achieved across the fracture site and at this point, the screw was used to lock in rotation.  A quarter turn was taken off to allow it to compress without rotating.  Once this was done, attention was turned towards the distal area where a freehand distal interlock was used to control rotation.  Once this was completed, the wounds were all irrigated, suctioned dry, closed in layers.  A sterile compressive dressing was applied and the patient was taken to the recovery, noted to be in satisfactory condition.  Estimated blood loss for the procedure was  250 mL.  Final amounts can be gotten from the anesthetic record.  Of note, multiple intraoperative fluoroscopic images were taken and interpreted by me.     Alta Corning, M.D.     Corliss Skains  D:  06/25/2015  T:  06/25/2015  Job:  [id number]

## 2015-06-25 NOTE — Brief Op Note (Signed)
06/24/2015 - 06/25/2015  2:33 PM  PATIENT:  David Collier  82 y.o. male  PRE-OPERATIVE DIAGNOSIS:  right hip intertrochanteric fracture  POST-OPERATIVE DIAGNOSIS:  right hip intertrochanteric fracture  PROCEDURE:  Procedure(s): INTRAMEDULLARY (IM) NAIL INTERTROCHANTRIC (Right)  SURGEON:  Surgeon(s) and Role:    * Dorna Leitz, MD - Primary  PHYSICIAN ASSISTANT:   ASSISTANTS: bethune   ANESTHESIA:   general  EBL:  Total I/O In: 1000 [I.V.:1000] Out: 700 [Urine:600; Blood:100]  BLOOD ADMINISTERED:none  DRAINS: none   LOCAL MEDICATIONS USED:  NONE  SPECIMEN:  No Specimen  DISPOSITION OF SPECIMEN:  N/A  COUNTS:  YES  TOURNIQUET:  * No tourniquets in log *  DICTATION: .Other Dictation: Dictation Number [id number]  PLAN OF CARE: Admit to inpatient   PATIENT DISPOSITION:  PACU - hemodynamically stable.   Delay start of Pharmacological VTE agent (>24hrs) due to surgical blood loss or risk of bleeding: no

## 2015-06-25 NOTE — Progress Notes (Signed)
Patient Demographics:    David Collier, is a 82 y.o. male, DOB - 01-12-1929, [id number]  Admit date - 06/24/2015   Admitting Physician Norval Morton, MD  Outpatient Primary MD for the patient is [name], MD  LOS - 1   Chief Complaint  Patient presents with  . Fall        Subjective:    [name] today has, No headache, No chest pain, No abdominal pain - No Nausea, No new weakness tingling or numbness, No Cough - SOB.     Assessment  & Plan :    1.Mechanical fall with right intertrochanteric femoral fracture. Orthopedics on board, due for surgical correction on 06/25/2015.  Cardio-Pulm Risk stratification for surgery and recommendations to minimize the same:-  A.Cardio-Pulmonary Risk -  this patient is a moderate to high risk  for adverse Cardio-Pulmonary  Outcome  from surgery, the risks and benefits were discussed and acceptable to patient, daughter and son-in-law  Recommendations for optimizing Cardio-Pulmonary  Risk risk factors  1. Keep SBP<140, HR<85, use Lopressor 5mg  IV q4hrs PRN, or B.Blocker drip PRN. 2. Moniotr I&Os. 3. Minimal sedation and Narcotics. 4. Good pulmunary toilet. 5. PRN Nebs and as needed oxygen to keep Pox>90% 6. Hb>8, transfuse as needed- Lasix 10mg  IV after each unit PRBC Transfused.   B.Bleeding Risk - no previous surgical complications, no easy bruising,  Antiplate meds aspirin   Lab Results  Component Value Date   PLT 125* 06/24/2015                  Lab Results  Component Value Date   INR 1.17 06/24/2015   INR 1.12 03/05/2014   INR 1.04 01/27/2009      Will request Surgeon to please Order DVT prophylaxis of his/her choice, along with activity, weight bearing precautions and diet if appropriate.     2. CAD. Chest pain-free, EKG  nonacute, on aspirin and low-dose beta blocker for second intervention continue.  3. Recent outpatient diagnosis of pneumonia. Ongoing treatment with Augmentin, currently afebrile, no signs of bacteremia, appears nontoxic.  4. Advanced dementia. At risk for delirium. I did use Seroquel at home dose. Minimize narcotics and benzodiazepines.  5. CK D stage IV. Baseline creatinine close to 1.6. At baseline.  6. Depression on Zoloft continue.    Code Status : DO NOT RESUSCITATE  Family Communication  : Family  Disposition Plan  : To be decided  Consults  :  Ortho  Procedures  :   ORIF of Right intertrochanteric femoral fracture due on 06/25/2015  DVT Prophylaxis  :  SCD  Lab Results  Component Value Date   PLT 125* 06/24/2015    Inpatient Medications  Scheduled Meds: . amoxicillin-clavulanate  1 tablet Oral BID  . aspirin EC  81 mg Oral Daily  .  ceFAZolin (ANCEF) IV  2 g Intravenous To SSTC  . docusate sodium  100 mg Oral BID  . ipratropium-albuterol  3 mL Nebulization Q6H  . metoprolol  12.5 mg Oral BID  . QUEtiapine  25 mg Oral QHS  . sertraline  50 mg Oral Daily   Continuous Infusions: . sodium chloride     PRN Meds:.acetaminophen **OR** acetaminophen, albuterol, alum & mag hydroxide-simeth, HYDROmorphone (  DILAUDID) injection, nitroGLYCERIN, ondansetron **OR** ondansetron (ZOFRAN) IV, polyethylene glycol  Antibiotics  :    Anti-infectives    Start     Dose/Rate Route Frequency Ordered Stop   06/25/15 1400  ceFAZolin (ANCEF) IVPB 2 g/50 mL premix     2 g 100 mL/hr over 30 Minutes Intravenous To Short Stay 06/25/15 0124 06/26/15 1400   06/24/15 2200  amoxicillin-clavulanate (AUGMENTIN) 500-125 MG per tablet 500 mg     1 tablet Oral 2 times daily 06/24/15 1907 06/27/15 0959        Objective:   Filed Vitals:   06/24/15 2223 06/25/15 0541 06/25/15 0848 06/25/15 0937  BP: 112/65 137/71  145/67  Pulse: 76 73  67  Temp: 98.2 F (36.8 C) 98.5 F (36.9 C)      TempSrc: Oral     Resp: 15 17    Height:      Weight: 87.4 kg (192 lb 10.9 oz)     SpO2: 93% 95% 93% 95%    Wt Readings from Last 3 Encounters:  06/24/15 87.4 kg (192 lb 10.9 oz)  03/04/15 90.719 kg (200 lb)  01/20/15 90.719 kg (200 lb)     Intake/Output Summary (Last 24 hours) at 06/25/15 0959 Last data filed at 06/24/15 2224  Gross per 24 hour  Intake      0 ml  Output    150 ml  Net   -150 ml     Physical Exam  Awake,Pleasantly confused, No new F.N deficits, Normal affect [place].AT,PERRAL Supple Neck,No JVD, No cervical lymphadenopathy appriciated.  Symmetrical Chest wall movement, Good air movement bilaterally, CTAB RRR,No Gallops,Rubs or new Murmurs, No Parasternal Heave +ve B.Sounds, Abd Soft, No tenderness, No organomegaly appriciated, No rebound - guarding or rigidity. No Cyanosis, Clubbing or edema, No new Rash or bruise       Data Review:   Micro Results Recent Results (from the past 240 hour(s))  Surgical pcr screen     Status: None   Collection Time: 06/25/15  6:49 AM  Result Value Ref Range Status   MRSA, PCR NEGATIVE NEGATIVE Final   Staphylococcus aureus NEGATIVE NEGATIVE Final    Comment:        The Xpert SA Assay (FDA approved for NASAL specimens in patients over 104 years of age), is one component of a comprehensive surveillance program.  Test performance has been validated by [facility] for patients greater than or equal to 24 year old. It is not intended to diagnose infection nor to guide or monitor treatment.     Radiology Reports Dg Chest 1 View  06/24/2015  CLINICAL DATA:  Fall.  Right hip pain. EXAM: CHEST 1 VIEW COMPARISON:  03/04/2015 FINDINGS: The heart size and mediastinal contours are within normal limits. Clear lungs.  No pleural effusion or pneumothorax. Bony thorax is demineralized.  There are old rib fractures. IMPRESSION: No active disease. Electronically Signed   By: Lajean Manes M.D.   On: 06/24/2015 17:07   Ct Cervical  Spine Wo Contrast  06/24/2015  CLINICAL DATA:  Fall at facility this am. Hx of Alzheimer's disease, Stroke, HTN. EXAM: CT CERVICAL SPINE WITHOUT CONTRAST TECHNIQUE: Multidetector CT imaging of the cervical spine was performed without intravenous contrast. Multiplanar CT image reconstructions were also generated. COMPARISON:  None. FINDINGS: No fracture.  No spondylolisthesis. Mild to moderate loss of disc height at C5-C6 with moderate loss of disc height at C6-C7. Uncovertebral spurring leads to moderate left and mild right neural foraminal narrowing  at these levels. There is facet degenerative change most evident on the right at C3-C4. There is moderate neural foraminal narrowing at this level on the right. Bones are demineralized. Soft tissues are unremarkable. Lung apices are clear.  Heart IMPRESSION: 1. No fracture or acute finding. 2. Degenerative changes as detailed. Electronically Signed   By: Lajean Manes M.D.   On: 06/24/2015 16:54   Dg Hip Unilat With Pelvis 2-3 Views Right  06/24/2015  CLINICAL DATA:  Fall, right hip pain EXAM: DG HIP (WITH OR WITHOUT PELVIS) 2-3V RIGHT COMPARISON:  None. FINDINGS: There is varus angulation of the proximal right femur. There is an intertrochanteric right femur fracture with mild displacement of fracture fragments. No other fractures identified. IMPRESSION: Acute intertrochanteric right femur fracture Electronically Signed   By: Skipper Cliche M.D.   On: 06/24/2015 17:09     CBC  Recent Labs Lab 06/24/15 1610  WBC 7.9  HGB 12.1*  HCT 38.0*  PLT 125*  MCV 96.2  MCH 30.6  MCHC 31.8  RDW 14.1  LYMPHSABS 1.2  MONOABS 0.8  EOSABS 0.0  BASOSABS 0.0    Chemistries   Recent Labs Lab 06/24/15 1610  NA 140  K 4.8  CL 104  CO2 27  GLUCOSE 109*  BUN 36*  CREATININE 1.54*  CALCIUM 8.7*   ------------------------------------------------------------------------------------------------------------------ No results for input(s): CHOL, HDL, LDLCALC,  TRIG, CHOLHDL, LDLDIRECT in the last 72 hours.  Lab Results  Component Value Date   HGBA1C 5.5 03/05/2014   ------------------------------------------------------------------------------------------------------------------ No results for input(s): TSH, T4TOTAL, T3FREE, THYROIDAB in the last 72 hours.  Invalid input(s): FREET3 ------------------------------------------------------------------------------------------------------------------ No results for input(s): VITAMINB12, FOLATE, FERRITIN, TIBC, IRON, RETICCTPCT in the last 72 hours.  Coagulation profile  Recent Labs Lab 06/24/15 1610  INR 1.17    No results for input(s): DDIMER in the last 72 hours.  Cardiac Enzymes No results for input(s): CKMB, TROPONINI, MYOGLOBIN in the last 168 hours.  Invalid input(s): CK ------------------------------------------------------------------------------------------------------------------    Component Value Date/Time   BNP 82.1 07/11/2014 1958    Time Spent in minutes   35   SINGH,PRASHANT K M.D on 06/25/2015 at 9:59 AM  Between 7am to 7pm - Pager - [phone]  After 7pm go to [email or web address] - password [facility]  [facility] -  Office  [phone]

## 2015-06-25 NOTE — Anesthesia Procedure Notes (Signed)
Procedure Name: Intubation Date/Time: 06/25/2015 1:31 PM Performed by: Clearnce Sorrel Pre-anesthesia Checklist: Patient identified, Emergency Drugs available, Timeout performed, Suction available and Patient being monitored Patient Re-evaluated:Patient Re-evaluated prior to inductionOxygen Delivery Method: Circle system utilized Preoxygenation: Pre-oxygenation with 100% oxygen Intubation Type: IV induction Ventilation: Mask ventilation without difficulty and Oral airway inserted - appropriate to patient size Laryngoscope Size: Mac and 3 Grade View: Grade I Tube type: Oral Tube size: 7.5 mm Number of attempts: 1 Airway Equipment and Method: Stylet Placement Confirmation: ETT inserted through vocal cords under direct vision,  breath sounds checked- equal and bilateral and positive ETCO2 Secured at: 23 cm Tube secured with: Tape Dental Injury: Teeth and Oropharynx as per pre-operative assessment

## 2015-06-25 NOTE — Consult Note (Signed)
Reason for Consult:right intertrochanteric hip fracture Referring Physician: hospitalists  David Collier is an 82 y.o. male.  HPI: the patient is an 82 year old male who suffered an intertrochanteric hip fracture on the right side.  He is seen at a facility incapable of taking care of him.  He is transferred to our facility for evaluation and treatment.  The patient is admitted via the internal medicine service and cleared for surgical intervention.  He'll be taken to the operating room when operative time is available and I had an opportunity to discuss the procedure with the patient and/or his family.  Past Medical History  Diagnosis Date  . Hyperlipidemia   . Coronary atherosclerosis of native coronary artery     BMS circumflex and OM 2006, PTCA ISR 2010, moderate LAD disease  . DJD (degenerative joint disease)   . Macular degeneration   . Alzheimer's dementia   . CKD (chronic kidney disease), stage III   . History of stroke     Seen radiographically-left PICA; unknown to patient.  Marland Kitchen TIA (transient ischemic attack) 03/05/2014  . 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 CAD (coronary artery disease), native coronary artery     Initially diagnosed 2003 2006 catheterization with bare metal stenting of the first marginal branch of the circumflex and continuation branch of the circumflex 2010 catheterization showing normal left main, 50-60% LAD unchanged from before, proximal stent of marginal patent, severe in-stent restenosis treated with cutting balloon angioplasty, patent stent in continuation branch with mild in-stent restenosis, occluded nondominant right coronary artery.   . Myocardial infarction ([place])     x5    1980s  sees DR. MICHAEL COOPER  . Anginal pain ([facility])     NONE IN 1 MONTH   . Hypertension   . Stroke ([place])   . Depression   . Headache     Past Surgical History  Procedure Laterality Date  . Appendectomy    . Cardiac catheterization      3 STENTS  AT   [place]  80S OR 90S  . Coronary angioplasty    . Cholecystectomy    . Multiple extractions with alveoloplasty N/A 10/22/2014    Procedure: MULTIPLE EXTRACTIONS  # 4,6, 7, 8, 10, 21, 22, 23, 24, 25, 26, 27, 28 and ALVEOLOPLASTY;  Surgeon: Diona Browner, DDS;  Location: [facility];  Service: Oral Surgery;  Laterality: N/A;    Family History  Problem Relation Age of Onset  . Heart failure      Social History:  reports that he quit smoking about 49 years ago. His smoking use included Cigarettes. He has never used smokeless tobacco. He reports that he does not drink alcohol or use illicit drugs.  Allergies:  Allergies  Allergen Reactions  . Aricept [Donepezil Hcl] Other (See Comments)    Altered mental status  . Ativan [Lorazepam] Other (See Comments)    Altered mental status  . Atorvastatin Nausea And Vomiting and Other (See Comments)    Joint pain  . Namenda [Memantine] Other (See Comments)    Altered mental status  . Other     Wife says pt was given 2 different medications for his nerves but said they made him have SI.  Marland Kitchen Simvastatin Nausea And Vomiting and Other (See Comments)    Joint pain  . Sorbitan Nausea And Vomiting    headache    Medications: I have reviewed the patient's current medications.  Results for orders placed or performed during the hospital encounter of 06/24/15 (  from the past 48 hour(s))  Basic metabolic panel     Status: Abnormal   Collection Time: 06/24/15  4:10 PM  Result Value Ref Range   Sodium 140 135 - 145 mmol/L   Potassium 4.8 3.5 - 5.1 mmol/L   Chloride 104 101 - 111 mmol/L   CO2 27 22 - 32 mmol/L   Glucose, Bld 109 (H) 65 - 99 mg/dL   BUN 36 (H) 6 - 20 mg/dL   Creatinine, Ser 1.54 (H) 0.61 - 1.24 mg/dL   Calcium 8.7 (L) 8.9 - 10.3 mg/dL   GFR calc non Af Amer 40 (L) >60 mL/min   GFR calc Af Amer 46 (L) >60 mL/min    Comment: (NOTE) The eGFR has been calculated using the CKD EPI equation. This calculation has not been validated in all  clinical situations. eGFR's persistently <60 mL/min signify possible Chronic Kidney Disease.    Anion gap 9 5 - 15  CBC WITH DIFFERENTIAL     Status: Abnormal   Collection Time: 06/24/15  4:10 PM  Result Value Ref Range   WBC 7.9 4.0 - 10.5 K/uL   RBC 3.95 (L) 4.22 - 5.81 MIL/uL   Hemoglobin 12.1 (L) 13.0 - 17.0 g/dL   HCT 38.0 (L) 39.0 - 52.0 %   MCV 96.2 78.0 - 100.0 fL   MCH 30.6 26.0 - 34.0 pg   MCHC 31.8 30.0 - 36.0 g/dL   RDW 14.1 11.5 - 15.5 %   Platelets 125 (L) 150 - 400 K/uL   Neutrophils Relative % 74 %   Neutro Abs 5.8 1.7 - 7.7 K/uL   Lymphocytes Relative 16 %   Lymphs Abs 1.2 0.7 - 4.0 K/uL   Monocytes Relative 10 %   Monocytes Absolute 0.8 0.1 - 1.0 K/uL   Eosinophils Relative 0 %   Eosinophils Absolute 0.0 0.0 - 0.7 K/uL   Basophils Relative 0 %   Basophils Absolute 0.0 0.0 - 0.1 K/uL  Protime-INR     Status: None   Collection Time: 06/24/15  4:10 PM  Result Value Ref Range   Prothrombin Time 15.1 11.6 - 15.2 seconds   INR 1.17 0.00 - 1.49  Type and screen [facility]     Status: None   Collection Time: 06/24/15  4:10 PM  Result Value Ref Range   ABO/RH(D) A POS    Antibody Screen NEG    Sample Expiration 06/27/2015     Dg Chest 1 View  06/24/2015  CLINICAL DATA:  Fall.  Right hip pain. EXAM: CHEST 1 VIEW COMPARISON:  03/04/2015 FINDINGS: The heart size and mediastinal contours are within normal limits. Clear lungs.  No pleural effusion or pneumothorax. Bony thorax is demineralized.  There are old rib fractures. IMPRESSION: No active disease. Electronically Signed   By: Lajean Manes M.D.   On: 06/24/2015 17:07   Ct Cervical Spine Wo Contrast  06/24/2015  CLINICAL DATA:  Fall at facility this am. Hx of Alzheimer's disease, Stroke, HTN. EXAM: CT CERVICAL SPINE WITHOUT CONTRAST TECHNIQUE: Multidetector CT imaging of the cervical spine was performed without intravenous contrast. Multiplanar CT image reconstructions were also generated. COMPARISON:  None.  FINDINGS: No fracture.  No spondylolisthesis. Mild to moderate loss of disc height at C5-C6 with moderate loss of disc height at C6-C7. Uncovertebral spurring leads to moderate left and mild right neural foraminal narrowing at these levels. There is facet degenerative change most evident on the right at C3-C4. There is moderate neural foraminal  narrowing at this level on the right. Bones are demineralized. Soft tissues are unremarkable. Lung apices are clear.  Heart IMPRESSION: 1. No fracture or acute finding. 2. Degenerative changes as detailed. Electronically Signed   By: Lajean Manes M.D.   On: 06/24/2015 16:54   Dg Hip Unilat With Pelvis 2-3 Views Right  06/24/2015  CLINICAL DATA:  Fall, right hip pain EXAM: DG HIP (WITH OR WITHOUT PELVIS) 2-3V RIGHT COMPARISON:  None. FINDINGS: There is varus angulation of the proximal right femur. There is an intertrochanteric right femur fracture with mild displacement of fracture fragments. No other fractures identified. IMPRESSION: Acute intertrochanteric right femur fracture Electronically Signed   By: Skipper Cliche M.D.   On: 06/24/2015 17:09    ROS  ROS: I have reviewed the patient's review of systems thoroughly and there are no positive responses as relates to the HPI. EXAM Blood pressure 112/65, pulse 76, temperature 98.2 F (36.8 C), temperature source Oral, resp. rate 15, height '5\' 8"'$  (1.727 m), weight 87.4 kg (192 lb 10.9 oz), SpO2 93 %. Physical Exam Well-developed well-nourished patient in no acute distress. Alert and oriented x3 HEENT:within normal limits Cardiac: Regular rate and rhythm Pulmonary: Lungs clear to auscultation Abdomen: Soft and nontender.  Normal active bowel sounds  Musculoskeletal: (right lower extremity: Externally rotated and shortened.  Painful with all range of motion.  Good distal pulses.  Ability to move foot is intact.) Assessment/Plan: 82 year old demented male with intertrochanteric hip fracture right side.//The  patient is admitted and cleared for surgery via The internal medicine service.he'll be taken in the operating room for open reduction internal fixation of the intertrochanteric hip fracture with intramedullary rod to minimize bleeding.  I will discuss the surgical procedure with the patient and/or family with a current plan for surgical intervention on Saturday 06-25-2015 in the afternoon.  Erykah Lippert L 06/25/2015, 1:05 AM

## 2015-06-26 DIAGNOSIS — S72001A Fracture of unspecified part of neck of right femur, initial encounter for closed fracture: Secondary | ICD-10-CM

## 2015-06-26 LAB — BASIC METABOLIC PANEL
ANION GAP: 10 (ref 5–15)
BUN: 20 mg/dL (ref 6–20)
CALCIUM: 7.9 mg/dL — AB (ref 8.9–10.3)
CO2: 25 mmol/L (ref 22–32)
Chloride: 102 mmol/L (ref 101–111)
Creatinine, Ser: 1.31 mg/dL — ABNORMAL HIGH (ref 0.61–1.24)
GFR, EST AFRICAN AMERICAN: 56 mL/min — AB (ref >60)
GFR, EST NON AFRICAN AMERICAN: 48 mL/min — AB (ref >60)
GLUCOSE: 119 mg/dL — AB (ref 65–99)
Potassium: 4.4 mmol/L (ref 3.5–5.1)
Sodium: 137 mmol/L (ref 135–145)

## 2015-06-26 LAB — CBC
HCT: 29.3 % — ABNORMAL LOW (ref 39.0–52.0)
Hemoglobin: 9.3 g/dL — ABNORMAL LOW (ref 13.0–17.0)
MCH: 30.2 pg (ref 26.0–34.0)
MCHC: 31.7 g/dL (ref 30.0–36.0)
MCV: 95.1 fL (ref 78.0–100.0)
PLATELETS: 122 10*3/uL — AB (ref 150–400)
RBC: 3.08 MIL/uL — ABNORMAL LOW (ref 4.22–5.81)
RDW: 14.2 % (ref 11.5–15.5)
WBC: 8 10*3/uL (ref 4.0–10.5)

## 2015-06-26 MED ORDER — SODIUM CHLORIDE 0.9 % IV BOLUS (SEPSIS)
500.0000 mL | Freq: Once | INTRAVENOUS | Status: AC
  Administered 2015-06-26: 500 mL via INTRAVENOUS

## 2015-06-26 NOTE — Progress Notes (Signed)
Utilization Review Completed.Ladine Kiper T3/08/2015  

## 2015-06-26 NOTE — Progress Notes (Addendum)
Patient Demographics:    David Collier, is a 82 y.o. male, DOB - February 10, 1929, [id number]  Admit date - 06/24/2015   Admitting Physician Norval Morton, MD  Outpatient Primary MD for the patient is [name], MD  LOS - 2   Chief Complaint  Patient presents with  . Fall        Subjective:    David Collier today has, No headache, No chest pain, No abdominal pain - No Nausea, No new weakness tingling or numbness, No Cough - SOB.     Assessment  & Plan :    1.Mechanical fall with right intertrochanteric femoral fracture. Orthopedics on board, post surgical correction on 06/25/2015.Has tolerated the procedure well, minimal perioperative blood loss with anemia not requiring transfusion, will continue to monitor H&H. Aspirin-Plavix for DVT prophylaxis, weightbearing as tolerated per orthopedics advance activity, SNF placement in the morning.  2. CAD. Chest pain-free, EKG nonacute, on aspirin and low-dose beta blocker for secondary prevention, continue.  3. Recent outpatient diagnosis of pneumonia. Ongoing treatment with Augmentin, currently afebrile, no signs of bacteremia, appears nontoxic. Stop date for antibiotic 06/27/2015.  4. Advanced dementia. At risk for delirium. I did use Seroquel at home dose. Minimize narcotics and benzodiazepines.  5. CK D stage IV. Baseline creatinine close to 1.6. At baseline.  6. Depression on Zoloft continue.    Code Status : DO NOT RESUSCITATE  Family Communication  : Family  Disposition Plan  : To be decided  Consults  :  Ortho  Procedures  :   ORIF of Right intertrochanteric femoral fracture due on 06/25/2015  DVT Prophylaxis  :  SCD  Lab Results  Component Value Date   PLT 122* 06/26/2015    Inpatient Medications  Scheduled Meds: .  amoxicillin-clavulanate  1 tablet Oral BID  . aspirin EC  81 mg Oral Daily  . clopidogrel  75 mg Oral Daily  . divalproex  750 mg Oral QHS  . docusate sodium  100 mg Oral BID  . ferrous sulfate  325 mg Oral Q breakfast  . metoprolol  12.5 mg Oral BID  . QUEtiapine  25 mg Oral QHS  . sertraline  50 mg Oral Daily  . sodium chloride  500 mL Intravenous Once  . cyanocobalamin  500 mcg Oral Daily   Continuous Infusions:   PRN Meds:.acetaminophen **OR** acetaminophen, albuterol, alum & mag hydroxide-simeth, bisacodyl, HYDROcodone-acetaminophen, HYDROmorphone (DILAUDID) injection, LORazepam, magnesium citrate, metoprolol, nitroGLYCERIN, [DISCONTINUED] ondansetron **OR** ondansetron (ZOFRAN) IV, polyethylene glycol  Antibiotics  :    Anti-infectives    Start     Dose/Rate Route Frequency Ordered Stop   06/25/15 1900  ceFAZolin (ANCEF) IVPB 2 g/50 mL premix     2 g 100 mL/hr over 30 Minutes Intravenous Every 6 hours 06/25/15 1556 06/26/15 0158   06/25/15 1400  ceFAZolin (ANCEF) IVPB 2 g/50 mL premix     2 g 100 mL/hr over 30 Minutes Intravenous To Short Stay 06/25/15 0124 06/25/15 1333   06/24/15 2200  amoxicillin-clavulanate (AUGMENTIN) 500-125 MG per tablet 500 mg     1 tablet Oral 2 times daily 06/24/15 1907 06/27/15 1959        Objective:   Filed Vitals:   06/26/15 0300 06/26/15 0455 06/26/15  0656 06/26/15 0700  BP:   88/53   Pulse:   70   Temp:   98 F (36.7 C)   TempSrc:   Oral   Resp:   16   Height:      Weight:      SpO2: 98% 99% 95% 98%    Wt Readings from Last 3 Encounters:  06/24/15 87.4 kg (192 lb 10.9 oz)  03/04/15 90.719 kg (200 lb)  01/20/15 90.719 kg (200 lb)     Intake/Output Summary (Last 24 hours) at 06/26/15 0944 Last data filed at 06/26/15 0659  Gross per 24 hour  Intake   2240 ml  Output   1700 ml  Net    540 ml     Physical Exam  Awake,Pleasantly confused, No new F.N deficits, Normal affect [place].AT,PERRAL Supple Neck,No JVD, No cervical  lymphadenopathy appriciated.  Symmetrical Chest wall movement, Good air movement bilaterally, CTAB RRR,No Gallops,Rubs or new Murmurs, No Parasternal Heave +ve B.Sounds, Abd Soft, No tenderness, No organomegaly appriciated, No rebound - guarding or rigidity. No Cyanosis, Clubbing or edema, No new Rash or bruise  , Right hip postoperative site appears stable     Data Review:   Micro Results Recent Results (from the past 240 hour(s))  Surgical pcr screen     Status: None   Collection Time: 06/25/15  6:49 AM  Result Value Ref Range Status   MRSA, PCR NEGATIVE NEGATIVE Final   Staphylococcus aureus NEGATIVE NEGATIVE Final    Comment:        The Xpert SA Assay (FDA approved for NASAL specimens in patients over 62 years of age), is one component of a comprehensive surveillance program.  Test performance has been validated by [facility] for patients greater than or equal to 5 year old. It is not intended to diagnose infection nor to guide or monitor treatment.     Radiology Reports Dg Chest 1 View  06/24/2015  CLINICAL DATA:  Fall.  Right hip pain. EXAM: CHEST 1 VIEW COMPARISON:  03/04/2015 FINDINGS: The heart size and mediastinal contours are within normal limits. Clear lungs.  No pleural effusion or pneumothorax. Bony thorax is demineralized.  There are old rib fractures. IMPRESSION: No active disease. Electronically Signed   By: Lajean Manes M.D.   On: 06/24/2015 17:07   Ct Cervical Spine Wo Contrast  06/24/2015  CLINICAL DATA:  Fall at facility this am. Hx of Alzheimer's disease, Stroke, HTN. EXAM: CT CERVICAL SPINE WITHOUT CONTRAST TECHNIQUE: Multidetector CT imaging of the cervical spine was performed without intravenous contrast. Multiplanar CT image reconstructions were also generated. COMPARISON:  None. FINDINGS: No fracture.  No spondylolisthesis. Mild to moderate loss of disc height at C5-C6 with moderate loss of disc height at C6-C7. Uncovertebral spurring leads to moderate  left and mild right neural foraminal narrowing at these levels. There is facet degenerative change most evident on the right at C3-C4. There is moderate neural foraminal narrowing at this level on the right. Bones are demineralized. Soft tissues are unremarkable. Lung apices are clear.  Heart IMPRESSION: 1. No fracture or acute finding. 2. Degenerative changes as detailed. Electronically Signed   By: Lajean Manes M.D.   On: 06/24/2015 16:54   Dg C-arm 1-60 Min  06/25/2015  CLINICAL DATA:  Pt had a right hip intertrochanteric fracture; IM nail placed in surgery. Fluoro time 1 minute 4 seconds EXAM: RIGHT FEMUR 2 VIEWS; DG C-ARM 61-120 MIN COMPARISON:  06/24/2015 FINDINGS: Four fluoroscopic spot images document  IM rod and sliding screw fixation across right femoral intertrochanteric fracture. There is a single distal interlocking screw. Fracture fragments in near anatomic alignment. IMPRESSION: 1. Internal fixation of right intertrochanteric femur fracture. Electronically Signed   By: Lucrezia Europe M.D.   On: 06/25/2015 14:53   Dg Hip Unilat With Pelvis 2-3 Views Right  06/24/2015  CLINICAL DATA:  Fall, right hip pain EXAM: DG HIP (WITH OR WITHOUT PELVIS) 2-3V RIGHT COMPARISON:  None. FINDINGS: There is varus angulation of the proximal right femur. There is an intertrochanteric right femur fracture with mild displacement of fracture fragments. No other fractures identified. IMPRESSION: Acute intertrochanteric right femur fracture Electronically Signed   By: Skipper Cliche M.D.   On: 06/24/2015 17:09   Dg Femur, Min 2 Views Right  06/25/2015  CLINICAL DATA:  Pt had a right hip intertrochanteric fracture; IM nail placed in surgery. Fluoro time 1 minute 4 seconds EXAM: RIGHT FEMUR 2 VIEWS; DG C-ARM 61-120 MIN COMPARISON:  06/24/2015 FINDINGS: Four fluoroscopic spot images document IM rod and sliding screw fixation across right femoral intertrochanteric fracture. There is a single distal interlocking screw. Fracture  fragments in near anatomic alignment. IMPRESSION: 1. Internal fixation of right intertrochanteric femur fracture. Electronically Signed   By: Lucrezia Europe M.D.   On: 06/25/2015 14:53     CBC  Recent Labs Lab 06/24/15 1610 06/26/15 0410  WBC 7.9 8.0  HGB 12.1* 9.3*  HCT 38.0* 29.3*  PLT 125* 122*  MCV 96.2 95.1  MCH 30.6 30.2  MCHC 31.8 31.7  RDW 14.1 14.2  LYMPHSABS 1.2  --   MONOABS 0.8  --   EOSABS 0.0  --   BASOSABS 0.0  --     Chemistries   Recent Labs Lab 06/24/15 1610 06/26/15 0410  NA 140 137  K 4.8 4.4  CL 104 102  CO2 27 25  GLUCOSE 109* 119*  BUN 36* 20  CREATININE 1.54* 1.31*  CALCIUM 8.7* 7.9*   ------------------------------------------------------------------------------------------------------------------ No results for input(s): CHOL, HDL, LDLCALC, TRIG, CHOLHDL, LDLDIRECT in the last 72 hours.  Lab Results  Component Value Date   HGBA1C 5.5 03/05/2014   ------------------------------------------------------------------------------------------------------------------ No results for input(s): TSH, T4TOTAL, T3FREE, THYROIDAB in the last 72 hours.  Invalid input(s): FREET3 ------------------------------------------------------------------------------------------------------------------ No results for input(s): VITAMINB12, FOLATE, FERRITIN, TIBC, IRON, RETICCTPCT in the last 72 hours.  Coagulation profile  Recent Labs Lab 06/24/15 1610  INR 1.17    No results for input(s): DDIMER in the last 72 hours.  Cardiac Enzymes No results for input(s): CKMB, TROPONINI, MYOGLOBIN in the last 168 hours.  Invalid input(s): CK ------------------------------------------------------------------------------------------------------------------    Component Value Date/Time   BNP 82.1 07/11/2014 1958    Time Spent in minutes   35   Safiyya Stokes K M.D on 06/26/2015 at 9:44 AM  Between 7am to 7pm - Pager - [phone]  After 7pm go to [email or web address]  - password [facility]  [facility] -  Office  [phone]

## 2015-06-26 NOTE — Evaluation (Signed)
Physical Therapy Evaluation Patient Details Name: David Collier MRN: [id number] DOB: 1928/11/30 Today's Date: 06/26/2015   History of Present Illness  Pt admtited from SNF after fall with painful right hip found to have femur fx s/p ORIF. PMHx: Alzheimers, CAD, CKD, CVA, HTN  Clinical Impression  Pt pleasantly confused unaware of place, situation or time. Pt able to follow some simple commands but inconsistent adherence to cues and commands. Pt with pain with moving RLE and sitting and unable to tolerate increased mobility at this time other than getting to EOB and back. Attempted  bil LE HEp but pt resistant with pain. Pt with decreased strength, function, transfers and mobility who will benefit from acute therapy to maximize mobility, function and strength to decrease burden of care.     Follow Up Recommendations SNF;Supervision/Assistance - 24 hour    Equipment Recommendations  None recommended by PT    Recommendations for Other Services       Precautions / Restrictions Precautions Precautions: Fall Restrictions Weight Bearing Restrictions: Yes RLE Weight Bearing: Weight bearing as tolerated      Mobility  Bed Mobility Overal bed mobility: Needs Assistance;+2 for physical assistance Bed Mobility: Supine to Sit;Sit to Supine     Supine to sit: Max assist;+2 for physical assistance;HOB elevated Sit to supine: Min guard   General bed mobility comments: max assist with cues for use of rail, sequential scooting and max assist to pivot with pad to EOB. pt unable to tolerate sitting with painful right hip and returned himself to supine with guarding for safety. +2 total assist to scoot to [facility] in trendelenburg  Transfers                    Ambulation/Gait                Stairs            Wheelchair Mobility    Modified Rankin (Stroke Patients Only)       Balance Overall balance assessment: Needs assistance   Sitting balance-Leahy Scale: Poor                                        Pertinent Vitals/Pain Pain Assessment:  (PAINAD = 5 with movement)    Home Living Family/patient expects to be discharged to:: Skilled nursing facility                      Prior Function Level of Independence: Needs assistance   Gait / Transfers Assistance Needed: pt states he walks with a walker but unable to provide additional information           Hand Dominance        Extremity/Trunk Assessment   Upper Extremity Assessment: Generalized weakness           Lower Extremity Assessment: Generalized weakness;RLE deficits/detail;Difficult to assess due to impaired cognition      Cervical / Trunk Assessment: Kyphotic  Communication   Communication: HOH  Cognition Arousal/Alertness: Awake/alert Behavior During Therapy: WFL for tasks assessed/performed Overall Cognitive Status: No family/caregiver present to determine baseline cognitive functioning       Memory: Decreased short-term memory              General Comments      Exercises        Assessment/Plan    PT Assessment Patient needs  continued PT services  PT Diagnosis Generalized weakness;Acute pain;Difficulty walking   PT Problem List Decreased strength;Decreased range of motion;Decreased activity tolerance;Decreased balance;Decreased mobility;Pain;Decreased safety awareness;Decreased knowledge of use of DME  PT Treatment Interventions Gait training;DME instruction;Functional mobility training;Therapeutic activities;Therapeutic exercise;Balance training;Patient/family education   PT Goals (Current goals can be found in the Care Plan section) Acute Rehab PT Goals PT Goal Formulation: Patient unable to participate in goal setting Time For Goal Achievement: 07/10/15 Potential to Achieve Goals: Fair    Frequency Min 3X/week   Barriers to discharge Decreased caregiver support      Co-evaluation               End of Session    Activity Tolerance: Patient limited by pain Patient left: in bed;with call bell/phone within reach;with bed alarm set Nurse Communication: Mobility status;Weight bearing status;Precautions;Patient requests pain meds         Time: [id number] PT Time Calculation (min) (ACUTE ONLY): 11 min   Charges:   PT Evaluation $PT Eval Moderate Complexity: 1 Procedure     PT G CodesMelford Aase 06/26/2015, 9:52 AM Elwyn Reach, [place]

## 2015-06-26 NOTE — Progress Notes (Signed)
Subjective: 1 Day Post-Op Procedure(s) (LRB): INTRAMEDULLARY (IM) NAIL INTERTROCHANTRIC (Right) Patient reports pain as mild.  No complaints today. Taking by mouth. Foley out this a.m.  Objective: Vital signs in last 24 hours: Temp:  [98 F (36.7 C)-98.7 F (37.1 C)] 98 F (36.7 C) (03/05 0656) Pulse Rate:  [67-89] 70 (03/05 0656) Resp:  [12-18] 16 (03/05 0656) BP: (88-156)/(53-76) 88/53 mmHg (03/05 0656) SpO2:  [95 %-100 %] 98 % (03/05 0700)  Intake/Output from previous day: 03/04 0701 - 03/05 0700 In: 2240 [P.O.:1040; I.V.:1200] Out: 1700 [Urine:1600; Blood:100] Intake/Output this shift:     Recent Labs  06/24/15 1610 06/26/15 0410  HGB 12.1* 9.3*    Recent Labs  06/24/15 1610 06/26/15 0410  WBC 7.9 8.0  RBC 3.95* 3.08*  HCT 38.0* 29.3*  PLT 125* 122*    Recent Labs  06/24/15 1610 06/26/15 0410  NA 140 137  K 4.8 4.4  CL 104 102  CO2 27 25  BUN 36* 20  CREATININE 1.54* 1.31*  GLUCOSE 109* 119*  CALCIUM 8.7* 7.9*    Recent Labs  06/24/15 1610  INR 1.17  Right hip exam:  Neurovascular intact Sensation intact distally Intact pulses distally Dorsiflexion/Plantar flexion intact Incision: dressing C/D/I Compartment soft  Assessment/Plan: 1 Day Post-Op Procedure(s) (LRB): INTRAMEDULLARY (IM) NAIL INTERTROCHANTRIC (Right) Plan: Plavix/aspirin which he was on preoperatively. Along with SCDs for DVT prophylaxis. Out of bed today. Up with physical therapy weightbearing as tolerated on the right. Minimize narcotic pain meds to decrease confusion. To SNF when medically stable  Lyndel Sarate G 06/26/2015, 9:28 AM

## 2015-06-27 ENCOUNTER — Encounter (HOSPITAL_COMMUNITY): Payer: Self-pay | Admitting: Orthopedic Surgery

## 2015-06-27 DIAGNOSIS — S728X9A Other fracture of unspecified femur, initial encounter for closed fracture: Secondary | ICD-10-CM | POA: Diagnosis not present

## 2015-06-27 DIAGNOSIS — S72101A Unspecified trochanteric fracture of right femur, initial encounter for closed fracture: Secondary | ICD-10-CM | POA: Diagnosis not present

## 2015-06-27 DIAGNOSIS — Z8673 Personal history of transient ischemic attack (TIA), and cerebral infarction without residual deficits: Secondary | ICD-10-CM | POA: Diagnosis not present

## 2015-06-27 DIAGNOSIS — E538 Deficiency of other specified B group vitamins: Secondary | ICD-10-CM | POA: Diagnosis not present

## 2015-06-27 DIAGNOSIS — M7981 Nontraumatic hematoma of soft tissue: Secondary | ICD-10-CM | POA: Diagnosis not present

## 2015-06-27 DIAGNOSIS — G9340 Encephalopathy, unspecified: Secondary | ICD-10-CM | POA: Diagnosis not present

## 2015-06-27 DIAGNOSIS — I2510 Atherosclerotic heart disease of native coronary artery without angina pectoris: Secondary | ICD-10-CM | POA: Diagnosis not present

## 2015-06-27 DIAGNOSIS — I25119 Atherosclerotic heart disease of native coronary artery with unspecified angina pectoris: Secondary | ICD-10-CM | POA: Diagnosis not present

## 2015-06-27 DIAGNOSIS — I10 Essential (primary) hypertension: Secondary | ICD-10-CM | POA: Diagnosis not present

## 2015-06-27 DIAGNOSIS — R278 Other lack of coordination: Secondary | ICD-10-CM | POA: Diagnosis not present

## 2015-06-27 DIAGNOSIS — N183 Chronic kidney disease, stage 3 (moderate): Secondary | ICD-10-CM | POA: Diagnosis not present

## 2015-06-27 DIAGNOSIS — E642 Sequelae of vitamin C deficiency: Secondary | ICD-10-CM | POA: Diagnosis not present

## 2015-06-27 DIAGNOSIS — J189 Pneumonia, unspecified organism: Secondary | ICD-10-CM | POA: Diagnosis not present

## 2015-06-27 DIAGNOSIS — M6281 Muscle weakness (generalized): Secondary | ICD-10-CM | POA: Diagnosis not present

## 2015-06-27 DIAGNOSIS — S72141D Displaced intertrochanteric fracture of right femur, subsequent encounter for closed fracture with routine healing: Secondary | ICD-10-CM | POA: Diagnosis not present

## 2015-06-27 DIAGNOSIS — J188 Other pneumonia, unspecified organism: Secondary | ICD-10-CM | POA: Diagnosis not present

## 2015-06-27 DIAGNOSIS — S72001A Fracture of unspecified part of neck of right femur, initial encounter for closed fracture: Secondary | ICD-10-CM | POA: Diagnosis not present

## 2015-06-27 DIAGNOSIS — S72101D Unspecified trochanteric fracture of right femur, subsequent encounter for closed fracture with routine healing: Secondary | ICD-10-CM | POA: Diagnosis not present

## 2015-06-27 DIAGNOSIS — G459 Transient cerebral ischemic attack, unspecified: Secondary | ICD-10-CM | POA: Diagnosis not present

## 2015-06-27 DIAGNOSIS — H3530 Unspecified macular degeneration: Secondary | ICD-10-CM | POA: Diagnosis not present

## 2015-06-27 DIAGNOSIS — K219 Gastro-esophageal reflux disease without esophagitis: Secondary | ICD-10-CM | POA: Diagnosis not present

## 2015-06-27 DIAGNOSIS — G8929 Other chronic pain: Secondary | ICD-10-CM | POA: Diagnosis not present

## 2015-06-27 DIAGNOSIS — N179 Acute kidney failure, unspecified: Secondary | ICD-10-CM | POA: Diagnosis not present

## 2015-06-27 DIAGNOSIS — K210 Gastro-esophageal reflux disease with esophagitis: Secondary | ICD-10-CM | POA: Diagnosis not present

## 2015-06-27 DIAGNOSIS — S37099S Other injury of unspecified kidney, sequela: Secondary | ICD-10-CM | POA: Diagnosis not present

## 2015-06-27 DIAGNOSIS — Z9181 History of falling: Secondary | ICD-10-CM | POA: Diagnosis not present

## 2015-06-27 DIAGNOSIS — S72009A Fracture of unspecified part of neck of unspecified femur, initial encounter for closed fracture: Secondary | ICD-10-CM | POA: Diagnosis not present

## 2015-06-27 DIAGNOSIS — R279 Unspecified lack of coordination: Secondary | ICD-10-CM | POA: Diagnosis not present

## 2015-06-27 DIAGNOSIS — I25118 Atherosclerotic heart disease of native coronary artery with other forms of angina pectoris: Secondary | ICD-10-CM | POA: Diagnosis not present

## 2015-06-27 DIAGNOSIS — S72091A Other fracture of head and neck of right femur, initial encounter for closed fracture: Secondary | ICD-10-CM | POA: Diagnosis not present

## 2015-06-27 DIAGNOSIS — R079 Chest pain, unspecified: Secondary | ICD-10-CM | POA: Diagnosis not present

## 2015-06-27 DIAGNOSIS — R296 Repeated falls: Secondary | ICD-10-CM | POA: Diagnosis not present

## 2015-06-27 DIAGNOSIS — M84359A Stress fracture, hip, unspecified, initial encounter for fracture: Secondary | ICD-10-CM | POA: Diagnosis not present

## 2015-06-27 DIAGNOSIS — W19XXXA Unspecified fall, initial encounter: Secondary | ICD-10-CM | POA: Diagnosis not present

## 2015-06-27 DIAGNOSIS — R2689 Other abnormalities of gait and mobility: Secondary | ICD-10-CM | POA: Diagnosis not present

## 2015-06-27 LAB — HEMOGLOBIN AND HEMATOCRIT, BLOOD
HEMATOCRIT: 28.9 % — AB (ref 39.0–52.0)
Hemoglobin: 9.6 g/dL — ABNORMAL LOW (ref 13.0–17.0)

## 2015-06-27 MED ORDER — CLOPIDOGREL BISULFATE 75 MG PO TABS: 75.0000 mg | ORAL_TABLET | Freq: Every day | ORAL | Status: DC

## 2015-06-27 MED ORDER — DOCUSATE SODIUM 100 MG PO CAPS: 100.0000 mg | ORAL_CAPSULE | Freq: Two times a day (BID) | ORAL | Status: DC

## 2015-06-27 MED ORDER — LORAZEPAM 0.5 MG PO TABS: 0.5000 mg | ORAL_TABLET | Freq: Every day | ORAL | Status: DC | PRN

## 2015-06-27 MED ORDER — RANITIDINE HCL 150 MG PO TABS: 150.0000 mg | ORAL_TABLET | Freq: Two times a day (BID) | ORAL | Status: DC

## 2015-06-27 NOTE — Progress Notes (Signed)
RT called to give PRN breathing treatment. Pt is confused and yelling out. Lungs rhonchi bilaterally. PT too confused to cough for me or follow any commands. SAT 96% on room air. Pt tolerated treatment well.

## 2015-06-27 NOTE — Evaluation (Signed)
Occupational Therapy Evaluation Patient Details Name: David Collier MRN: [id number] DOB: 07-01-1928 Today's Date: 06/27/2015    History of Present Illness Pt admtited from SNF after fall with painful right hip found to have femur fx s/p ORIF. PMHx: Alzheimers, CAD, CKD, CVA, HTN   Clinical Impression   Pt admitted with above.  He presents to OT with the below listed deficits.  He was limited by pain this am.  He currently requires mod - total A for ADLs.  He was admitted from SNF with plan to return to SNF for rehab.  All further OT needs can be addressed at SNF.  Will sign off.     Follow Up Recommendations  SNF;Supervision/Assistance - 24 hour    Equipment Recommendations  None recommended by OT    Recommendations for Other Services       Precautions / Restrictions Precautions Precautions: Fall Restrictions Weight Bearing Restrictions: Yes RLE Weight Bearing: Weight bearing as tolerated      Mobility Bed Mobility Overal bed mobility: Needs Assistance Bed Mobility: Rolling Rolling: Max assist;+2 for physical assistance         General bed mobility comments: Pt grabbing at therapist, pulling away, resisting movement due to pain   Transfers                 General transfer comment: did not attempt due to pain     Balance                                            ADL Overall ADL's : Needs assistance/impaired Eating/Feeding: Set up;Bed level   Grooming: Wash/dry hands;Wash/dry face;Brushing hair;Set up;Supervision/safety;Bed level   Upper Body Bathing: Moderate assistance;Bed level   Lower Body Bathing: Total assistance;Bed level   Upper Body Dressing : Moderate assistance;Bed level   Lower Body Dressing: Total assistance;Bed level   Toilet Transfer: Total assistance Toilet Transfer Details (indicate cue type and reason): unable to attempt due to pain  Toileting- Clothing Manipulation and Hygiene: Total assistance;Bed  level Toileting - Clothing Manipulation Details (indicate cue type and reason): Pt incontinent of urine.  Assisted pt with peri care and linen change      Functional mobility during ADLs: Maximal assistance;+2 for physical assistance       Vision     Perception     Praxis      Pertinent Vitals/Pain       Hand Dominance     Extremity/Trunk Assessment Upper Extremity Assessment Upper Extremity Assessment: Overall WFL for tasks assessed   Lower Extremity Assessment Lower Extremity Assessment: Defer to PT evaluation   Cervical / Trunk Assessment Cervical / Trunk Assessment: Kyphotic   Communication Communication Communication: HOH   Cognition Arousal/Alertness: Awake/alert Behavior During Therapy: WFL for tasks assessed/performed Overall Cognitive Status: No family/caregiver present to determine baseline cognitive functioning (h/o dementia )                     General Comments       Exercises       Shoulder Instructions      Home Living Family/patient expects to be discharged to:: Skilled nursing facility  Prior Functioning/Environment Level of Independence: Needs assistance  Gait / Transfers Assistance Needed: pt states he walks with a walker but unable to provide additional information          OT Diagnosis: Generalized weakness;Cognitive deficits;Acute pain   OT Problem List: Decreased strength;Decreased activity tolerance;Impaired balance (sitting and/or standing);Decreased cognition;Decreased safety awareness;Decreased knowledge of use of DME or AE;Pain   OT Treatment/Interventions:      OT Goals(Current goals can be found in the care plan section) Acute Rehab OT Goals Patient Stated Goal: Pt unable  OT Goal Formulation: All assessment and education complete, DC therapy  OT Frequency:     Barriers to D/C:            Co-evaluation              End of Session Nurse  Communication: Mobility status  Activity Tolerance: Patient limited by pain Patient left: in bed;with call bell/phone within reach;with bed alarm set   Time: [id number] OT Time Calculation (min): 23 min Charges:  OT General Charges $OT Visit: 1 Procedure OT Evaluation $OT Eval Moderate Complexity: 1 Procedure OT Treatments $Therapeutic Activity: 8-22 mins G-Codes:    Ciela Mahajan M 2015/07/05, 10:05 AM

## 2015-06-27 NOTE — Progress Notes (Signed)
Subjective: 2 Days Post-Op Procedure(s) (LRB): INTRAMEDULLARY (IM) NAIL INTERTROCHANTRIC (Right) Patient reports pain as mild. At baseline level of confusion secondary to dementia. Family at bedside. Getting ready for transfer back to [facility]   Objective: Vital signs in last 24 hours: Temp:  [98.2 F (36.8 C)] 98.2 F (36.8 C) (03/05 2300) Pulse Rate:  [99-100] 99 (03/05 2300) Resp:  [16] 16 (03/05 2300) BP: (103-130)/(47-88) 103/88 mmHg (03/06 1022) SpO2:  [94 %-96 %] 96 % (03/06 0905)  Intake/Output from previous day: 03/05 0701 - 03/06 0700 In: 1340 [P.O.:840; IV Piggyback:500] Out: -  Intake/Output this shift: Total I/O In: 360 [P.O.:360] Out: -    Recent Labs  06/24/15 1610 06/26/15 0410 06/27/15 0509  HGB 12.1* 9.3* 9.6*    Recent Labs  06/24/15 1610 06/26/15 0410 06/27/15 0509  WBC 7.9 8.0  --   RBC 3.95* 3.08*  --   HCT 38.0* 29.3* 28.9*  PLT 125* 122*  --     Recent Labs  06/24/15 1610 06/26/15 0410  NA 140 137  K 4.8 4.4  CL 104 102  CO2 27 25  BUN 36* 20  CREATININE 1.54* 1.31*  GLUCOSE 109* 119*  CALCIUM 8.7* 7.9*    Recent Labs  06/24/15 1610  INR 1.17   Right hip exam: Surgical wounds and dressings are benign. No significant thigh swelling. Calf soft nontender. Moves right foot actively. Distal pulses 1+.   Assessment/Plan: 2 Days Post-Op Procedure(s) (LRB): INTRAMEDULLARY (IM) NAIL INTERTROCHANTRIC (Right) Plan: Discharged back to skilled nursing facility today. Plavix/aspirin which he was on preoperatively will manage DVT prophylaxis. Weight-bear as tolerated on right. Will need follow-up with Dr. Berenice Primas at [facility] orthopedics office number [phone]. Appointment should be made in 2-3 weeks. The staples can be removed when he is 2 weeks postop.    [place] G 06/27/2015, 3:08 PM

## 2015-06-27 NOTE — Care Management Important Message (Signed)
Important Message  Patient Details  Name: David Collier MRN: [id number] Date of Birth: 17-Nov-1928   Medicare Important Message Given:  Yes    [place] Fettes P [place] 06/27/2015, 3:08 PM

## 2015-06-27 NOTE — Discharge Summary (Signed)
RIDHA DELLIGATTI, is a 82 y.o. male  DOB 1928/06/21  MRN [id number].  Admission date:  06/24/2015  Admitting Physician  Norval Morton, MD  Discharge Date:  06/27/2015   Primary MD  [name], MD  Recommendations for primary care physician for things to follow:   Check CBC, BMP in 3-4 days   Admission Diagnosis  Fall [W19.XXXA] Hip fracture, right, closed, initial encounter [facility]) [S72.001A]   Discharge Diagnosis  Fall [W19.XXXA] Hip fracture, right, closed, initial encounter ([place]) [S72.001A]    Active Problems:   Essential hypertension   CAD (coronary artery disease), native coronary artery   CKD (chronic kidney disease), stage III   Dementia   Trochanteric fracture of right femur ([place])   Community acquired pneumonia   Hip fracture [facility])      Past Medical History  Diagnosis Date  . Hyperlipidemia   . Coronary atherosclerosis of native coronary artery     BMS circumflex and OM 2006, PTCA ISR 2010, moderate LAD disease  . DJD (degenerative joint disease)   . Macular degeneration   . Alzheimer's dementia   . CKD (chronic kidney disease), stage III   . History of stroke     Seen radiographically-left PICA; unknown to patient.  [place] Kitchen TIA (transient ischemic attack) 03/05/2014  . 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 CAD (coronary artery disease), native coronary artery     Initially diagnosed 2003 2006 catheterization with bare metal stenting of the first marginal branch of the circumflex and continuation branch of the circumflex 2010 catheterization showing normal left main, 50-60% LAD unchanged from before, proximal stent of marginal patent, severe in-stent restenosis treated with cutting balloon angioplasty, patent stent in continuation branch with mild in-stent restenosis, occluded  nondominant right coronary artery.   . Myocardial infarction ([place])     x5    1980s  sees DR. MICHAEL COOPER  . Anginal pain (H[facility])     NONE IN 1 MONTH   . Hypertension   . Stroke ([place])   . Depression   . Headache     Past Surgical History  Procedure Laterality Date  . Appendectomy    . Cardiac catheterization      3 STENTS  AT  [place]  80S OR 90S  . Coronary angioplasty    . Cholecystectomy    . Multiple extractions with alveoloplasty N/A 10/22/2014    Procedure: MULTIPLE EXTRACTIONS  # 4,6, 7, 8, 10, 21, 22, 23, 24, 25, 26, 27, 28 and ALVEOLOPLASTY;  Surgeon: Diona Browner, DDS;  Location: [facility];  Service: Oral Surgery;  Laterality: N/A;       HPI  from the history and physical done on the day of admission:    JAREMY HARTLINE is a 82 y.o. male With a history of Alzheimer's, chronic kidney disease stage III, history of stroke, coronary arteries disease with angioplasty and stents on Plavix and aspirin, history of MI 5 in the 76s, hypertension, depression. Patient is a resident at a nursing facility due to  his dementia. His wife is his power of attorney. History is obtained by the family who relays that the patient fell earlier today. The patient was then placed in a wheelchair and did not ambulate for the remainder of the day. Patient was supposed to have x-rays from mobile x-ray unit, however as this was not done, the patient was brought to the emergency department for evaluation. Patient complains of discomfort when attempting to ambulate and is fairly comfortable at rest. Pain is on his right hip and is nonradiating. The patient was previously ambulatory before his fall.     Hospital Course:     1. Mechanical fall with right intertrochanteric femoral fracture. Orthopedics on board, post surgical correction on 06/25/2015.Has tolerated the procedure well, minimal perioperative blood loss with anemia not requiring transfusion, will continue to monitor H&H. Aspirin-Plavix for DVT  prophylaxis, weightbearing as tolerated per orthopedics on R Leg with a walker, gradually advance activity, SNF placement.  2. CAD. Chest pain-free, EKG nonacute, on aspirin-plavix and low-dose beta blocker for secondary prevention, continue.  3. Recent outpatient diagnosis of pneumonia. Ongoing treatment with Augmentin, currently afebrile, no signs of bacteremia, appears nontoxic. Stop date for antibiotic 06/27/2015.  4. Advanced dementia. At risk for delirium. Continue Seroquel at home dose. Minimize narcotics and benzodiazepines.  5. CK D stage IV. Baseline creatinine close to 1.6. At baseline.  6. Depression on Zoloft continue.      Discharge Condition: Fair  Follow UP  Follow-up Information    Follow up with GRAVES,JOHN L, MD. Schedule an appointment as soon as possible for a visit in 2 weeks.   Specialty:  Orthopedic Surgery   Contact information:   [address] [place] [address] [phone]       Follow up with [name], MD. Schedule an appointment as soon as possible for a visit in 1 week.   Specialty:  Cardiology   Contact information:   [id number] [place] [address] [phone]        [place] and Activity recommendation: See Discharge Instructions below  Discharge Instructions       Discharge Instructions    Diet - low sodium heart healthy    Complete by:  As directed      Discharge instructions    Complete by:  As directed   Follow with Primary MD [name], MD in 7 days   Get CBC, CMP, 2 view Chest X ray checked  by Primary MD next visit.    Activity: WBAT R Leg with walker,with Full fall precautions use walker/cane & assistance as needed   Disposition SNF   Diet:   Heart Healthy  with feeding assistance and aspiration precautions.  For Heart failure patients - Check your Weight same time everyday, if you gain over 2 pounds, or you develop in leg swelling, experience more  shortness of breath or chest pain, call your Primary MD immediately. Follow Cardiac Low Salt Diet and 1.5 lit/day fluid restriction.   On your next visit with your primary care physician please Get Medicines reviewed and adjusted.   Please request your Prim.MD to go over all Hospital Tests and Procedure/Radiological results at the follow up, please get all Hospital records sent to your Prim MD by signing hospital release before you go home.   If you experience worsening of your admission symptoms, develop shortness of breath, life threatening emergency, suicidal or homicidal thoughts you must seek medical attention immediately by calling  911 or calling your MD immediately  if symptoms less severe.  You Must read complete instructions/literature along with all the possible adverse reactions/side effects for all the Medicines you take and that have been prescribed to you. Take any new Medicines after you have completely understood and accpet all the possible adverse reactions/side effects.   Do not drive, operating heavy machinery, perform activities at heights, swimming or participation in water activities or provide baby sitting services if your were admitted for syncope or siezures until you have seen by Primary MD or a Neurologist and advised to do so again.  Do not drive when taking Pain medications.    Do not take more than prescribed Pain, Sleep and Anxiety Medications  Special Instructions: If you have smoked or chewed Tobacco  in the last 2 yrs please stop smoking, stop any regular Alcohol  and or any Recreational drug use.  Wear Seat belts while driving.   Please note  You were cared for by a hospitalist during your hospital stay. If you have any questions about your discharge medications or the care you received while you were in the hospital after you are discharged, you can call the unit and asked to speak with the hospitalist on call if the hospitalist that took care of you is  not available. Once you are discharged, your primary care physician will handle any further medical issues. Please note that NO REFILLS for any discharge medications will be authorized once you are discharged, as it is imperative that you return to your primary care physician (or establish a relationship with a primary care physician if you do not have one) for your aftercare needs so that they can reassess your need for medications and monitor your lab values.     Increase activity slowly    Complete by:  As directed      Weight bearing as tolerated    Complete by:  As directed   Laterality:  right  Extremity:  Lower             Discharge Medications       Medication List    STOP taking these medications        ibuprofen 400 MG tablet  Commonly known as:  ADVIL,MOTRIN      TAKE these medications        amoxicillin-clavulanate 500-125 MG tablet  Commonly known as:  AUGMENTIN  Take 1 tablet by mouth 2 (two) times daily. 5 day course starting on 06/22/2015     aspirin EC 81 MG tablet  Take 2 tablets (162 mg total) by mouth daily.     clopidogrel 75 MG tablet  Commonly known as:  PLAVIX  Take 1 tablet (75 mg total) by mouth daily.     cyanocobalamin 500 MCG tablet  Take 500 mcg by mouth daily.     divalproex 500 MG 24 hr tablet  Commonly known as:  DEPAKOTE ER  Take 750 mg by mouth at bedtime.     docusate sodium 100 MG capsule  Commonly known as:  COLACE  Take 1 capsule (100 mg total) by mouth 2 (two) times daily.     fluticasone 27.5 MCG/SPRAY nasal spray  Commonly known as:  VERAMYST  Place 1 spray into the nose 2 (two) times daily.     HYDROcodone-acetaminophen 5-325 MG tablet  Commonly known as:  NORCO  Take 1 tablet by mouth every 6 (six) hours as needed for moderate pain or severe pain.  loratadine 10 MG tablet  Commonly known as:  CLARITIN  Take 10 mg by mouth daily.     LORazepam 0.5 MG tablet  Commonly known as:  ATIVAN  Take 1 tablet (0.5 mg  total) by mouth daily as needed for anxiety.     methocarbamol 500 MG tablet  Commonly known as:  ROBAXIN  Take 1 tablet by mouth 3 (three) times daily. For spasms     metoprolol tartrate 25 MG tablet  Commonly known as:  LOPRESSOR  Take 1 tablet (25 mg total) by mouth daily.     NITROSTAT 0.4 MG SL tablet  Generic drug:  nitroGLYCERIN  DISSOLVE ONE TABLET UNDER THE TONGUE EVERY 5 MINUTES AS NEEDED FOR CHEST PAIN     QUEtiapine 25 MG tablet  Commonly known as:  SEROQUEL  Take 25 mg by mouth at bedtime.     ranitidine 150 MG tablet  Commonly known as:  ZANTAC  Take 1 tablet (150 mg total) by mouth 2 (two) times daily.     Red Yeast Rice 600 MG Caps  Take 1 capsule by mouth every morning.     sertraline 50 MG tablet  Commonly known as:  ZOLOFT  Take 50 mg by mouth daily.     vitamin C 500 MG tablet  Commonly known as:  ASCORBIC ACID  Take 500 mg by mouth daily.        Major procedures and Radiology Reports - PLEASE review detailed and final reports for all details, in brief -       Dg Chest 1 View  06/24/2015  CLINICAL DATA:  Fall.  Right hip pain. EXAM: CHEST 1 VIEW COMPARISON:  03/04/2015 FINDINGS: The heart size and mediastinal contours are within normal limits. Clear lungs.  No pleural effusion or pneumothorax. Bony thorax is demineralized.  There are old rib fractures. IMPRESSION: No active disease. Electronically Signed   By: Lajean Manes M.D.   On: 06/24/2015 17:07   Ct Cervical Spine Wo Contrast  06/24/2015  CLINICAL DATA:  Fall at facility this am. Hx of Alzheimer's disease, Stroke, HTN. EXAM: CT CERVICAL SPINE WITHOUT CONTRAST TECHNIQUE: Multidetector CT imaging of the cervical spine was performed without intravenous contrast. Multiplanar CT image reconstructions were also generated. COMPARISON:  None. FINDINGS: No fracture.  No spondylolisthesis. Mild to moderate loss of disc height at C5-C6 with moderate loss of disc height at C6-C7. Uncovertebral spurring leads  to moderate left and mild right neural foraminal narrowing at these levels. There is facet degenerative change most evident on the right at C3-C4. There is moderate neural foraminal narrowing at this level on the right. Bones are demineralized. Soft tissues are unremarkable. Lung apices are clear.  Heart IMPRESSION: 1. No fracture or acute finding. 2. Degenerative changes as detailed. Electronically Signed   By: Lajean Manes M.D.   On: 06/24/2015 16:54   Dg C-arm 1-60 Min  06/25/2015  CLINICAL DATA:  Pt had a right hip intertrochanteric fracture; IM nail placed in surgery. Fluoro time 1 minute 4 seconds EXAM: RIGHT FEMUR 2 VIEWS; DG C-ARM 61-120 MIN COMPARISON:  06/24/2015 FINDINGS: Four fluoroscopic spot images document IM rod and sliding screw fixation across right femoral intertrochanteric fracture. There is a single distal interlocking screw. Fracture fragments in near anatomic alignment. IMPRESSION: 1. Internal fixation of right intertrochanteric femur fracture. Electronically Signed   By: Lucrezia Europe M.D.   On: 06/25/2015 14:53   Dg Hip Unilat With Pelvis 2-3 Views Right  06/24/2015  CLINICAL DATA:  Fall, right hip pain EXAM: DG HIP (WITH OR WITHOUT PELVIS) 2-3V RIGHT COMPARISON:  None. FINDINGS: There is varus angulation of the proximal right femur. There is an intertrochanteric right femur fracture with mild displacement of fracture fragments. No other fractures identified. IMPRESSION: Acute intertrochanteric right femur fracture Electronically Signed   By: Skipper Cliche M.D.   On: 06/24/2015 17:09   Dg Femur, Min 2 Views Right  06/25/2015  CLINICAL DATA:  Pt had a right hip intertrochanteric fracture; IM nail placed in surgery. Fluoro time 1 minute 4 seconds EXAM: RIGHT FEMUR 2 VIEWS; DG C-ARM 61-120 MIN COMPARISON:  06/24/2015 FINDINGS: Four fluoroscopic spot images document IM rod and sliding screw fixation across right femoral intertrochanteric fracture. There is a single distal interlocking  screw. Fracture fragments in near anatomic alignment. IMPRESSION: 1. Internal fixation of right intertrochanteric femur fracture. Electronically Signed   By: Lucrezia Europe M.D.   On: 06/25/2015 14:53    Micro Results      Recent Results (from the past 240 hour(s))  Surgical pcr screen     Status: None   Collection Time: 06/25/15  6:49 AM  Result Value Ref Range Status   MRSA, PCR NEGATIVE NEGATIVE Final   Staphylococcus aureus NEGATIVE NEGATIVE Final    Comment:        The Xpert SA Assay (FDA approved for NASAL specimens in patients over 2 years of age), is one component of a comprehensive surveillance program.  Test performance has been validated by [facility] for patients greater than or equal to 53 year old. It is not intended to diagnose infection nor to guide or monitor treatment.        Today   Subjective    [name] today has no headache,no chest abdominal pain,no new weakness tingling or numbness, feels much better.    Objective   Blood pressure 105/47, pulse 99, temperature 98.2 F (36.8 C), temperature source Axillary, resp. rate 16, height 5\' 8"  (1.727 m), weight 87.4 kg (192 lb 10.9 oz), SpO2 96 %.   Intake/Output Summary (Last 24 hours) at 06/27/15 0947 Last data filed at 06/26/15 2357  Gross per 24 hour  Intake   1220 ml  Output      0 ml  Net   1220 ml    Exam Awake, mildly confused, No new F.N deficits, Normal affect [place].AT,PERRAL Supple Neck,No JVD, No cervical lymphadenopathy appriciated.  Symmetrical Chest wall movement, Good air movement bilaterally, CTAB RRR,No Gallops,Rubs or new Murmurs, No Parasternal Heave +ve B.Sounds, Abd Soft, Non tender, No organomegaly appriciated, No rebound -guarding or rigidity. No Cyanosis, Clubbing or edema, No new Rash or bruise, R hip post op scar stable   Data Review   CBC w Diff: Lab Results  Component Value Date   WBC 8.0 06/26/2015   HGB 9.6* 06/27/2015   HCT 28.9* 06/27/2015   PLT 122*  06/26/2015   LYMPHOPCT 16 06/24/2015   MONOPCT 10 06/24/2015   EOSPCT 0 06/24/2015   BASOPCT 0 06/24/2015    CMP: Lab Results  Component Value Date   NA 137 06/26/2015   K 4.4 06/26/2015   CL 102 06/26/2015   CO2 25 06/26/2015   BUN 20 06/26/2015   CREATININE 1.31* 06/26/2015   PROT 6.7 03/04/2015   ALBUMIN 3.9 03/04/2015   BILITOT 0.3 03/04/2015   ALKPHOS 54 03/04/2015   AST 20 03/04/2015   ALT 17 03/04/2015     Total Time in preparing paper  work, Microbiologist and todays exam - 35 minutes  Thurnell Lose M.D on 06/27/2015 at 9:47 AM  [facility]   Office  [phone]

## 2015-06-27 NOTE — Progress Notes (Signed)
Called report to [facility] aware that he had pain med skin intact no breakdown ,fall risk.

## 2015-06-27 NOTE — Clinical Social Work Note (Addendum)
Patient is from [facility] and will return via [place].  Patient will discharge today per MD order. Patient will discharge to (return): [facility] RN to call report prior to transportation to: [phone] Transportation: [facility] to be called at 2:00pm  CSW sent discharge summary to SNF for review.  CSW spoke with daughter at conference table regarding discharge concerns of returning to memory care at [facility].  Patient has been discharged.  Patient family is aware and agreeable for return; however concerns: more attention to patient, bed alarm and possible transfer to a room closer to the RN station was raised.  These concerns were relayed to the liaison at [facility].  CSW also encouraged family to have a family meeting with the SNF to express concerns and expectations.  Family is agreeable.  Liaison aware of family meeting request.  Nonnie Done, LCSW [phone]  5N1-9, 2S 15-16 and Psychiatric Service Line  Licensed Clinical Social Worker

## 2015-06-27 NOTE — NC FL2 (Signed)
[place] LEVEL OF CARE SCREENING TOOL     IDENTIFICATION  Patient Name: David Collier Birthdate: [birth year] Sex: male Admission Date (Current Location): 06/24/2015  [facility] and [place] Number:  [employer] and Address:  [facility], [place] [address] [place], [place] [address]      Provider Number: [id number]  Attending Physician Name and Address:  Thurnell Lose, MD  Relative Name and Phone Number:       Current Level of Care: Hospital Recommended Level of Care: [place] Prior Approval Number:    Date Approved/Denied:   PASRR Number: [id number]  Discharge Plan:      Current Diagnoses: Patient Active Problem List   Diagnosis Date Noted  . Trochanteric fracture of right femur ([place]) 06/24/2015  . Community acquired pneumonia 06/24/2015  . Hip fracture ([place]) 06/24/2015  . Obesity (BMI 30-39.9) 07/11/2014  . Dementia 07/11/2014  . Unstable angina pectoris ([place]) 07/11/2014  . 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 CKD (chronic kidney disease), stage III 03/05/2014  . Macular degeneration (senile) of retina 02/16/2009  . Essential hypertension 02/16/2009  . Hyperlipidemia   . CAD (coronary artery disease), native coronary artery     Orientation RESPIRATION BLADDER Height & Weight     Self, Time  Normal Continent Weight: 192 lb 10.9 oz (87.4 kg) Height:  5\' 8"  (172.7 cm)  BEHAVIORAL SYMPTOMS/MOOD NEUROLOGICAL BOWEL NUTRITION STATUS      Continent Diet (please check dc summary for dietary needs)  AMBULATORY STATUS COMMUNICATION OF NEEDS Skin   Limited Assist Verbally Surgical wounds                       Personal Care Assistance Level of Assistance  Dressing, Bathing Bathing Assistance: Limited assistance   Dressing Assistance: Limited assistance      Functional Limitations Info             SPECIAL CARE FACTORS FREQUENCY  PT (By licensed PT), OT (By licensed OT)                    Contractures Contractures Info: Not present    Additional Factors Info  Code Status, Allergies Code Status Info: DNR Allergies Info: Aricept, Ativan, Atorvastatin, Namenda, Simvastatin, Sorbitan           Current Medications (06/27/2015):  This is the current hospital active medication list Current Facility-Administered Medications  Medication Dose Route Frequency Provider Last Rate Last Dose  . acetaminophen (TYLENOL) tablet 650 mg  650 mg Oral Q6H PRN Truett Mainland, DO   650 mg at 06/26/15 [id number]   Or  . acetaminophen (TYLENOL) suppository 650 mg  650 mg Rectal Q6H PRN Tanna Savoy Stinson, DO      . albuterol (PROVENTIL) (2.5 MG/3ML) 0.083% nebulizer solution 2.5 mg  2.5 mg Nebulization Q4H PRN Dianne Dun, NP   2.5 mg at 06/27/15 0904  . alum & mag hydroxide-simeth (MAALOX/MYLANTA) 200-200-20 MG/5ML suspension 30 mL  30 mL Oral Q6H PRN Tanna Savoy Stinson, DO      . amoxicillin-clavulanate (AUGMENTIN) 500-125 MG per tablet 500 mg  1 tablet Oral BID Tanna Savoy Stinson, DO   500 mg at 06/27/15 [id number]  . aspirin EC tablet 81 mg  81 mg Oral Daily Tanna Savoy Stinson, DO   81 mg at 06/27/15 1022  .  bisacodyl (DULCOLAX) EC tablet 5 mg  5 mg Oral Daily PRN Gary Fleet, PA-C      . clopidogrel (PLAVIX) tablet 75 mg  75 mg Oral Daily Gary Fleet, PA-C   75 mg at 06/27/15 1023  . divalproex (DEPAKOTE ER) 24 hr tablet 750 mg  750 mg Oral QHS Thurnell Lose, MD   750 mg at 06/26/15 2350  . docusate sodium (COLACE) capsule 100 mg  100 mg Oral BID Tanna Savoy Stinson, DO   100 mg at 06/27/15 1022  . ferrous sulfate tablet 325 mg  325 mg Oral Q breakfast Gary Fleet, PA-C   325 mg at 06/27/15 0817  . HYDROcodone-acetaminophen (NORCO/VICODIN) 5-325 MG per tablet 1 tablet  1 tablet Oral Q6H PRN Gary Fleet, PA-C   1 tablet at 06/26/15 1729  .  HYDROmorphone (DILAUDID) injection 0.5 mg  0.5 mg Intravenous Q2H PRN Tanna Savoy Stinson, DO   0.5 mg at 06/25/15 [id number]  . LORazepam (ATIVAN) tablet 0.5 mg  0.5 mg Oral Daily PRN Thurnell Lose, MD   0.5 mg at 06/26/15 1324  . magnesium citrate solution 1 Bottle  1 Bottle Oral Once PRN Gary Fleet, PA-C      . metoprolol (LOPRESSOR) injection 5 mg  5 mg Intravenous Q4H PRN Thurnell Lose, MD      . metoprolol tartrate (LOPRESSOR) tablet 12.5 mg  12.5 mg Oral BID Tanna Savoy Stinson, DO   12.5 mg at 06/27/15 1022  . nitroGLYCERIN (NITROSTAT) SL tablet 0.4 mg  0.4 mg Sublingual Q5 min PRN Tanna Savoy Stinson, DO      . ondansetron [facility]) injection 4 mg  4 mg Intravenous Q6H PRN Tanna Savoy Stinson, DO      . polyethylene glycol (MIRALAX / GLYCOLAX) packet 17 g  17 g Oral Daily PRN Tanna Savoy Stinson, DO      . QUEtiapine (SEROQUEL) tablet 25 mg  25 mg Oral QHS Tanna Savoy Stinson, DO   25 mg at 06/26/15 2349  . sertraline (ZOLOFT) tablet 50 mg  50 mg Oral Daily Tanna Savoy Stinson, DO   50 mg at 06/27/15 1022  . vitamin B-12 (CYANOCOBALAMIN) tablet 500 mcg  500 mcg Oral Daily Thurnell Lose, MD   500 mcg at 06/27/15 1023     Discharge Medications: Please see discharge summary for a list of discharge medications.  Relevant Imaging Results:  Relevant Lab Results:   Additional Information SSN: [id number] patient is ready to dc today  Jerremy Maione C, LCSW

## 2015-06-27 NOTE — Discharge Instructions (Signed)
Follow with Primary MD [name], MD in 7 days   Get CBC, CMP, 2 view Chest X ray checked  by Primary MD next visit.    Activity: WBAT R Leg with walker,with Full fall precautions use walker/cane & assistance as needed   Disposition SNF   Diet:   Heart Healthy  with feeding assistance and aspiration precautions.  For Heart failure patients - Check your Weight same time everyday, if you gain over 2 pounds, or you develop in leg swelling, experience more shortness of breath or chest pain, call your Primary MD immediately. Follow Cardiac Low Salt Diet and 1.5 lit/day fluid restriction.   On your next visit with your primary care physician please Get Medicines reviewed and adjusted.   Please request your Prim.MD to go over all Hospital Tests and Procedure/Radiological results at the follow up, please get all Hospital records sent to your Prim MD by signing hospital release before you go home.   If you experience worsening of your admission symptoms, develop shortness of breath, life threatening emergency, suicidal or homicidal thoughts you must seek medical attention immediately by calling 911 or calling your MD immediately  if symptoms less severe.  You Must read complete instructions/literature along with all the possible adverse reactions/side effects for all the Medicines you take and that have been prescribed to you. Take any new Medicines after you have completely understood and accpet all the possible adverse reactions/side effects.   Do not drive, operating heavy machinery, perform activities at heights, swimming or participation in water activities or provide baby sitting services if your were admitted for syncope or siezures until you have seen by Primary MD or a Neurologist and advised to do so again.  Do not drive when taking Pain medications.    Do not take more than prescribed Pain, Sleep and Anxiety Medications  Special Instructions: If you have smoked or chewed  Tobacco  in the last 2 yrs please stop smoking, stop any regular Alcohol  and or any Recreational drug use.  Wear Seat belts while driving.   Please note  You were cared for by a hospitalist during your hospital stay. If you have any questions about your discharge medications or the care you received while you were in the hospital after you are discharged, you can call the unit and asked to speak with the hospitalist on call if the hospitalist that took care of you is not available. Once you are discharged, your primary care physician will handle any further medical issues. Please note that NO REFILLS for any discharge medications will be authorized once you are discharged, as it is imperative that you return to your primary care physician (or establish a relationship with a primary care physician if you do not have one) for your aftercare needs so that they can reassess your need for medications and monitor your lab values.

## 2015-06-28 DIAGNOSIS — S72091A Other fracture of head and neck of right femur, initial encounter for closed fracture: Secondary | ICD-10-CM | POA: Diagnosis not present

## 2015-06-28 DIAGNOSIS — Z9181 History of falling: Secondary | ICD-10-CM | POA: Diagnosis not present

## 2015-06-28 DIAGNOSIS — G9340 Encephalopathy, unspecified: Secondary | ICD-10-CM | POA: Diagnosis not present

## 2015-06-28 DIAGNOSIS — S37099S Other injury of unspecified kidney, sequela: Secondary | ICD-10-CM | POA: Diagnosis not present

## 2015-06-30 DIAGNOSIS — S37099S Other injury of unspecified kidney, sequela: Secondary | ICD-10-CM | POA: Diagnosis not present

## 2015-06-30 DIAGNOSIS — S72091A Other fracture of head and neck of right femur, initial encounter for closed fracture: Secondary | ICD-10-CM | POA: Diagnosis not present

## 2015-06-30 DIAGNOSIS — Z9181 History of falling: Secondary | ICD-10-CM | POA: Diagnosis not present

## 2015-06-30 DIAGNOSIS — G9340 Encephalopathy, unspecified: Secondary | ICD-10-CM | POA: Diagnosis not present

## 2015-07-01 DIAGNOSIS — M6281 Muscle weakness (generalized): Secondary | ICD-10-CM | POA: Diagnosis not present

## 2015-07-10 NOTE — Progress Notes (Signed)
Cardiology Office Note:    Date:  07/11/2015   ID:  David Collier, DOB 29-Sep-1928, MRN [id number]  PCP:  David Caprice, DO  Cardiologist:  Dr. [name]   Electrophysiologist:  n/a  Chief Complaint  Patient presents with  . Hospitalization Follow-up    History of Present Illness:     David Collier is a 82 y.o. male with a hx of CAD status post prior stenting to LCx and OM branches, prior PCI in 2010 with cutting balloon angioplasty for treatment of severe in-stent restenosis, HL, CKD stage III, HTN, prior TIA/stroke, dementia. He is intolerant of statins. Last seen by Dr. Burt Knack 7/15. Admitted 3/16 with chest pain. Cardiac enzymes are negative for myocardial infarction. He was not felt to be a candidate for invasive cardiac evaluation with his progressive dementia. Antianginal regimen was adjusted. Admitted in 3/17 with right hip fracture secondary to mechanical fall. He underwent ORIF.   Returns for follow-up. He stays at the [facility] rehabilitation facility. He is overall doing well. History is obtained with the help of his aid. He has baseline dementia. There are no reports of chest pain or significant dyspnea. No reports of syncope. He denies edema.   Past Medical History  Diagnosis Date  . Hyperlipidemia   . Coronary atherosclerosis of native coronary artery     BMS circumflex and OM 2006, PTCA ISR 2010, moderate LAD disease  . DJD (degenerative joint disease)   . Macular degeneration   . Alzheimer's dementia   . CKD (chronic kidney disease), stage III   . History of stroke     Seen radiographically-left PICA; unknown to patient.  Marland Kitchen TIA (transient ischemic attack) 03/05/2014  . 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 CAD (coronary artery disease), native coronary artery     Initially diagnosed 2003 2006 catheterization with bare metal stenting of the first marginal branch of the circumflex and continuation branch of the circumflex 2010  catheterization showing normal left main, 50-60% LAD unchanged from before, proximal stent of marginal patent, severe in-stent restenosis treated with cutting balloon angioplasty, patent stent in continuation branch with mild in-stent restenosis, occluded nondominant right coronary artery.   . Myocardial infarction ([place])     x5    1980s  sees DR. MICHAEL COOPER  . Anginal pain (H[facility])     NONE IN 1 MONTH   . Hypertension   . Stroke ([place])   . Depression   . Headache     Past Surgical History  Procedure Laterality Date  . Appendectomy    . Cardiac catheterization      3 STENTS  AT  [place]  80S OR 90S  . Coronary angioplasty    . Cholecystectomy    . Multiple extractions with alveoloplasty N/A 10/22/2014    Procedure: MULTIPLE EXTRACTIONS  # 4,6, 7, 8, 10, 21, 22, 23, 24, 25, 26, 27, 28 and ALVEOLOPLASTY;  Surgeon: Diona Browner, DDS;  Location: [facility];  Service: Oral Surgery;  Laterality: N/A;  . Intramedullary (im) nail intertrochanteric Right 06/25/2015    Procedure: INTRAMEDULLARY (IM) NAIL INTERTROCHANTRIC;  Surgeon: Dorna Leitz, MD;  Location: [facility];  Service: Orthopedics;  Laterality: Right;    Current Medications: Outpatient Prescriptions Prior to Visit  Medication Sig Dispense Refill  . amoxicillin-clavulanate (AUGMENTIN) 500-125 MG tablet Take 1 tablet by mouth 2 (two) times daily. 5 day course starting on 06/22/2015    . Ascorbic Acid (VITAMIN C) 500 MG tablet Take 500 mg by mouth daily.      Marland Kitchen  aspirin EC 81 MG tablet Take 2 tablets (162 mg total) by mouth daily. (Patient taking differently: Take 81 mg by mouth daily. )    . clopidogrel (PLAVIX) 75 MG tablet Take 1 tablet (75 mg total) by mouth daily.    . cyanocobalamin 500 MCG tablet Take 500 mcg by mouth daily.    . divalproex (DEPAKOTE ER) 500 MG 24 hr tablet Take 750 mg by mouth at bedtime.     . docusate sodium (COLACE) 100 MG capsule Take 1 capsule (100 mg total) by mouth 2 (two) times daily. 10 capsule 0  . fluticasone  (VERAMYST) 27.5 MCG/SPRAY nasal spray Place 1 spray into the nose 2 (two) times daily.    Marland Kitchen HYDROcodone-acetaminophen (NORCO) 5-325 MG tablet Take 1 tablet by mouth every 6 (six) hours as needed for moderate pain or severe pain. 40 tablet 0  . loratadine (CLARITIN) 10 MG tablet Take 10 mg by mouth daily.    Marland Kitchen LORazepam (ATIVAN) 0.5 MG tablet Take 1 tablet (0.5 mg total) by mouth daily as needed for anxiety. 15 tablet 0  . methocarbamol (ROBAXIN) 500 MG tablet Take 1 tablet by mouth 3 (three) times daily. For spasms    . metoprolol (LOPRESSOR) 25 MG tablet Take 1 tablet (25 mg total) by mouth daily. (Patient taking differently: Take 25 mg by mouth 2 (two) times daily. ) 60 tablet 11  . NITROSTAT 0.4 MG SL tablet DISSOLVE ONE TABLET UNDER THE TONGUE EVERY 5 MINUTES AS NEEDED FOR CHEST PAIN 25 tablet 3  . QUEtiapine (SEROQUEL) 25 MG tablet Take 25 mg by mouth at bedtime.     . ranitidine (ZANTAC) 150 MG tablet Take 1 tablet (150 mg total) by mouth 2 (two) times daily.    . Red Yeast Rice 600 MG CAPS Take 1 capsule by mouth every morning.     . sertraline (ZOLOFT) 50 MG tablet Take 50 mg by mouth daily.      No facility-administered medications prior to visit.     Allergies:   Aricept; Ativan; Atorvastatin; Namenda; Other; Simvastatin; and Sorbitan   Social History   Social History  . Marital Status: Married    Spouse Name: N/A  . Number of Children: N/A  . Years of Education: N/A   Social History Main Topics  . Smoking status: Former Smoker    Types: Cigarettes    Quit date: 04/23/1966  . Smokeless tobacco: Never Used  . Alcohol Use: No  . Drug Use: No  . Sexual Activity: Not Asked   Other Topics Concern  . None   Social History Narrative   Retired Naval architect, lives with wife     ROS:   Please see the history of present illness.    Review of Systems  HENT: Positive for hearing loss.   All other systems reviewed and are negative.   Physical Exam:    VS:  BP 118/54  mmHg  Pulse 62  Ht 5\' 9"  (1.753 m)   GEN: Well nourished, well developed, in no acute distress HEENT: normal Neck: no JVD, no masses Cardiac: Normal S1/S2,  RRR; no murmurs, rubs, or gallops, trace bilat LE edema;     Respiratory:  clear to auscultation bilaterally; no wheezing, rhonchi or rales GI: soft, nontender  MS: no deformity or atrophy Skin: warm and dry Neuro: No focal deficits  Psych: Alert and oriented x 3, normal affect  Wt Readings from Last 3 Encounters:  06/24/15 192 lb 10.9 oz (87.4 kg)  03/04/15 200 lb (90.719 kg)  01/20/15 200 lb (90.719 kg)      Studies/Labs Reviewed:     EKG:  EKG is not ordered today.  The ekg ordered today demonstrates n/a  Recent Labs: 07/11/2014: B Natriuretic Peptide 82.1; TSH 2.145 03/04/2015: ALT 17 06/26/2015: BUN 20; Creatinine, Ser 1.31*; Platelets 122*; Potassium 4.4; Sodium 137 06/27/2015: Hemoglobin 9.6*   Recent Lipid Panel    Component Value Date/Time   CHOL 177 07/12/2014 0300   TRIG 110 07/12/2014 0300   HDL 33* 07/12/2014 0300   CHOLHDL 5.4 07/12/2014 0300   VLDL 22 07/12/2014 0300   LDLCALC 122* 07/12/2014 0300    Additional studies/ records that were reviewed today include:   Carotid US 11/15 IMPRESSION: Right greater than left carotid atherosclerosis. No hemodynamically significant ICA stenosis by ultrasound. Degree of narrowing less than 50% bilaterally.  Echo 11/15 Mild LVH, EF 60-65%, normal wall motion, grade 1 diastolic dysfunction, mild RVE, normal RV systolic function  LHC [id number] LM patent LAD prox 50-60% LCx prox 40-50%, OM1 stent ok with 90% ISR, AV groove stent patent with 30-40% ISR RCA prox occluded with L-R collats PCI: Cutting Balloon Angioplasty to OM1 stent ISR   ASSESSMENT:     1. Coronary artery disease involving native coronary artery of native heart without angina pectoris   2. History of TIA (transient ischemic attack)     PLAN:     In order of problems listed above:  1. CAD -  No apparent angina.  Continue ASA, Plavix, beta-blocker.  He is statin intol.  Med Rx only given advanced dementia.    2. Hx of TIA - Continue ASA, Plavix.    Medication Adjustments/Labs and Tests Ordered: Current medicines are reviewed at length with the patient today.  Concerns regarding medicines are outlined above.  Medication changes, Labs and Tests ordered today are outlined in the Patient Instructions noted below. Patient Instructions  Medication Instructions:  Your physician recommends that you continue on your current medications as directed. Please refer to the Current Medication list given to you today.  Labwork: NONE  Testing/Procedures: NONE  Follow-Up: Your physician wants you to follow-up in: [place]. Angelena Form. You will receive a reminder letter in the mail two months in advance. If you don't receive a letter, please call our office to schedule the follow-up appointment.  Any Other Special Instructions Will Be Listed Below (If Applicable).  If you need a refill on your cardiac medications before your next appointment, please call your pharmacy.    Signed, Richardson Dopp, PA-C  07/11/2015 3:14 PM    [facility] [place], [place],   [address] Phone: [phone]; Fax: [phone]

## 2015-07-11 ENCOUNTER — Ambulatory Visit (INDEPENDENT_AMBULATORY_CARE_PROVIDER_SITE_OTHER): Payer: Medicare Other | Admitting: Physician Assistant

## 2015-07-11 ENCOUNTER — Encounter: Payer: Self-pay | Admitting: Physician Assistant

## 2015-07-11 VITALS — BP 118/54 | HR 62 | Ht 69.0 in

## 2015-07-11 DIAGNOSIS — I2510 Atherosclerotic heart disease of native coronary artery without angina pectoris: Secondary | ICD-10-CM

## 2015-07-11 DIAGNOSIS — Z8673 Personal history of transient ischemic attack (TIA), and cerebral infarction without residual deficits: Secondary | ICD-10-CM

## 2015-07-11 NOTE — Patient Instructions (Addendum)
Medication Instructions:  Your physician recommends that you continue on your current medications as directed. Please refer to the Current Medication list given to you today.  Labwork: NONE  Testing/Procedures: NONE  Follow-Up: Your physician wants you to follow-up in: 6 MONTHS WITH DR. COOPER.  You will receive a reminder letter in the mail two months in advance. If you don't receive a letter, please call our office to schedule the follow-up appointment.  Any Other Special Instructions Will Be Listed Below (If Applicable).  If you need a refill on your cardiac medications before your next appointment, please call your pharmacy. 

## 2015-07-11 NOTE — Progress Notes (Signed)
I changed AVS and told the pt f/u is with DR. Cooper not Dr. Angelena Form.

## 2015-07-12 DIAGNOSIS — S72141D Displaced intertrochanteric fracture of right femur, subsequent encounter for closed fracture with routine healing: Secondary | ICD-10-CM | POA: Diagnosis not present

## 2015-07-25 DIAGNOSIS — S72091A Other fracture of head and neck of right femur, initial encounter for closed fracture: Secondary | ICD-10-CM | POA: Diagnosis not present

## 2015-07-25 DIAGNOSIS — S37099S Other injury of unspecified kidney, sequela: Secondary | ICD-10-CM | POA: Diagnosis not present

## 2015-07-25 DIAGNOSIS — G9340 Encephalopathy, unspecified: Secondary | ICD-10-CM | POA: Diagnosis not present

## 2015-07-25 DIAGNOSIS — Z9181 History of falling: Secondary | ICD-10-CM | POA: Diagnosis not present

## 2015-08-08 DIAGNOSIS — Z9181 History of falling: Secondary | ICD-10-CM | POA: Diagnosis not present

## 2015-08-08 DIAGNOSIS — W19XXXA Unspecified fall, initial encounter: Secondary | ICD-10-CM | POA: Diagnosis not present

## 2015-08-16 DIAGNOSIS — M7981 Nontraumatic hematoma of soft tissue: Secondary | ICD-10-CM | POA: Diagnosis not present

## 2015-08-18 DIAGNOSIS — S72141D Displaced intertrochanteric fracture of right femur, subsequent encounter for closed fracture with routine healing: Secondary | ICD-10-CM | POA: Diagnosis not present

## 2015-08-29 DIAGNOSIS — Z9181 History of falling: Secondary | ICD-10-CM | POA: Diagnosis not present

## 2015-08-29 DIAGNOSIS — S37099S Other injury of unspecified kidney, sequela: Secondary | ICD-10-CM | POA: Diagnosis not present

## 2015-08-29 DIAGNOSIS — S72091A Other fracture of head and neck of right femur, initial encounter for closed fracture: Secondary | ICD-10-CM | POA: Diagnosis not present

## 2015-08-29 DIAGNOSIS — G9340 Encephalopathy, unspecified: Secondary | ICD-10-CM | POA: Diagnosis not present

## 2015-09-01 DIAGNOSIS — D500 Iron deficiency anemia secondary to blood loss (chronic): Secondary | ICD-10-CM | POA: Diagnosis not present

## 2015-09-01 DIAGNOSIS — Z79899 Other long term (current) drug therapy: Secondary | ICD-10-CM | POA: Diagnosis not present

## 2015-09-06 ENCOUNTER — Encounter (HOSPITAL_COMMUNITY): Payer: Self-pay | Admitting: Emergency Medicine

## 2015-09-06 ENCOUNTER — Emergency Department (HOSPITAL_COMMUNITY)
Admission: EM | Admit: 2015-09-06 | Discharge: 2015-09-07 | Disposition: A | Discharge status: Home / Self Care | Payer: Medicare Other | Attending: Emergency Medicine | Admitting: Emergency Medicine

## 2015-09-06 ENCOUNTER — Emergency Department (HOSPITAL_COMMUNITY): Payer: Medicare Other

## 2015-09-06 DIAGNOSIS — Z7982 Long term (current) use of aspirin: Secondary | ICD-10-CM | POA: Diagnosis not present

## 2015-09-06 DIAGNOSIS — F329 Major depressive disorder, single episode, unspecified: Secondary | ICD-10-CM | POA: Insufficient documentation

## 2015-09-06 DIAGNOSIS — Z8673 Personal history of transient ischemic attack (TIA), and cerebral infarction without residual deficits: Secondary | ICD-10-CM | POA: Diagnosis not present

## 2015-09-06 DIAGNOSIS — I129 Hypertensive chronic kidney disease with stage 1 through stage 4 chronic kidney disease, or unspecified chronic kidney disease: Secondary | ICD-10-CM | POA: Insufficient documentation

## 2015-09-06 DIAGNOSIS — Z79899 Other long term (current) drug therapy: Secondary | ICD-10-CM | POA: Insufficient documentation

## 2015-09-06 DIAGNOSIS — K922 Gastrointestinal hemorrhage, unspecified: Secondary | ICD-10-CM | POA: Diagnosis not present

## 2015-09-06 DIAGNOSIS — I2510 Atherosclerotic heart disease of native coronary artery without angina pectoris: Secondary | ICD-10-CM | POA: Insufficient documentation

## 2015-09-06 DIAGNOSIS — R1013 Epigastric pain: Secondary | ICD-10-CM | POA: Diagnosis present

## 2015-09-06 DIAGNOSIS — E785 Hyperlipidemia, unspecified: Secondary | ICD-10-CM | POA: Insufficient documentation

## 2015-09-06 DIAGNOSIS — N183 Chronic kidney disease, stage 3 (moderate): Secondary | ICD-10-CM | POA: Diagnosis not present

## 2015-09-06 DIAGNOSIS — R0789 Other chest pain: Secondary | ICD-10-CM | POA: Diagnosis not present

## 2015-09-06 DIAGNOSIS — R079 Chest pain, unspecified: Secondary | ICD-10-CM | POA: Insufficient documentation

## 2015-09-06 DIAGNOSIS — I9589 Other hypotension: Secondary | ICD-10-CM | POA: Insufficient documentation

## 2015-09-06 DIAGNOSIS — Z87891 Personal history of nicotine dependence: Secondary | ICD-10-CM | POA: Insufficient documentation

## 2015-09-06 DIAGNOSIS — I959 Hypotension, unspecified: Secondary | ICD-10-CM

## 2015-09-06 DIAGNOSIS — R4182 Altered mental status, unspecified: Secondary | ICD-10-CM | POA: Insufficient documentation

## 2015-09-06 DIAGNOSIS — I252 Old myocardial infarction: Secondary | ICD-10-CM | POA: Insufficient documentation

## 2015-09-06 DIAGNOSIS — G309 Alzheimer's disease, unspecified: Secondary | ICD-10-CM | POA: Insufficient documentation

## 2015-09-06 HISTORY — DX: Unspecified convulsions: R56.9

## 2015-09-06 LAB — CBC WITH DIFFERENTIAL/PLATELET
Basophils Absolute: 0 10*3/uL (ref 0.0–0.1)
Basophils Relative: 0 %
EOS ABS: 0.5 10*3/uL (ref 0.0–0.7)
EOS PCT: 5 %
HCT: 28.9 % — ABNORMAL LOW (ref 39.0–52.0)
Hemoglobin: 9 g/dL — ABNORMAL LOW (ref 13.0–17.0)
LYMPHS ABS: 1.9 10*3/uL (ref 0.7–4.0)
LYMPHS PCT: 21 %
MCH: 30.1 pg (ref 26.0–34.0)
MCHC: 31.1 g/dL (ref 30.0–36.0)
MCV: 96.7 fL (ref 78.0–100.0)
MONO ABS: 0.8 10*3/uL (ref 0.1–1.0)
Monocytes Relative: 9 %
Neutro Abs: 5.8 10*3/uL (ref 1.7–7.7)
Neutrophils Relative %: 65 %
PLATELETS: 260 10*3/uL (ref 150–400)
RBC: 2.99 MIL/uL — ABNORMAL LOW (ref 4.22–5.81)
RDW: 14.8 % (ref 11.5–15.5)
WBC: 9 10*3/uL (ref 4.0–10.5)

## 2015-09-06 LAB — COMPREHENSIVE METABOLIC PANEL
ALT: 10 U/L — ABNORMAL LOW (ref 17–63)
ANION GAP: 5 (ref 5–15)
AST: 10 U/L — ABNORMAL LOW (ref 15–41)
Albumin: 3.2 g/dL — ABNORMAL LOW (ref 3.5–5.0)
Alkaline Phosphatase: 118 U/L (ref 38–126)
BUN: 29 mg/dL — ABNORMAL HIGH (ref 6–20)
CHLORIDE: 106 mmol/L (ref 101–111)
CO2: 25 mmol/L (ref 22–32)
CREATININE: 1.46 mg/dL — AB (ref 0.61–1.24)
Calcium: 8 mg/dL — ABNORMAL LOW (ref 8.9–10.3)
GFR, EST AFRICAN AMERICAN: 49 mL/min — AB (ref >60)
GFR, EST NON AFRICAN AMERICAN: 42 mL/min — AB (ref >60)
Glucose, Bld: 102 mg/dL — ABNORMAL HIGH (ref 65–99)
POTASSIUM: 4.1 mmol/L (ref 3.5–5.1)
Sodium: 136 mmol/L (ref 135–145)
Total Bilirubin: 0.3 mg/dL (ref 0.3–1.2)
Total Protein: 6.2 g/dL — ABNORMAL LOW (ref 6.5–8.1)

## 2015-09-06 LAB — I-STAT CHEM 8, ED
BUN: 27 mg/dL — ABNORMAL HIGH (ref 6–20)
CALCIUM ION: 1.15 mmol/L (ref 1.13–1.30)
CHLORIDE: 105 mmol/L (ref 101–111)
CREATININE: 1.4 mg/dL — AB (ref 0.61–1.24)
GLUCOSE: 94 mg/dL (ref 65–99)
HCT: 30 % — ABNORMAL LOW (ref 39.0–52.0)
HEMOGLOBIN: 10.2 g/dL — AB (ref 13.0–17.0)
POTASSIUM: 4.3 mmol/L (ref 3.5–5.1)
Sodium: 143 mmol/L (ref 135–145)
TCO2: 23 mmol/L (ref 0–100)

## 2015-09-06 LAB — URINALYSIS, ROUTINE W REFLEX MICROSCOPIC
Bilirubin Urine: NEGATIVE
GLUCOSE, UA: NEGATIVE mg/dL
Hgb urine dipstick: NEGATIVE
Ketones, ur: NEGATIVE mg/dL
LEUKOCYTES UA: NEGATIVE
Nitrite: NEGATIVE
PROTEIN: NEGATIVE mg/dL
Specific Gravity, Urine: 1.02 (ref 1.005–1.030)
pH: 5.5 (ref 5.0–8.0)

## 2015-09-06 LAB — I-STAT TROPONIN, ED: TROPONIN I, POC: 0.01 ng/mL (ref 0.00–0.08)

## 2015-09-06 LAB — I-STAT CG4 LACTIC ACID, ED: LACTIC ACID, VENOUS: 1.13 mmol/L (ref 0.5–2.0)

## 2015-09-06 MED ORDER — PANTOPRAZOLE SODIUM 20 MG PO TBEC: 20.0000 mg | DELAYED_RELEASE_TABLET | Freq: Every day | ORAL | Status: AC

## 2015-09-06 MED ORDER — SODIUM CHLORIDE 0.9 % IV BOLUS (SEPSIS)
500.0000 mL | Freq: Once | INTRAVENOUS | Status: AC
  Administered 2015-09-06: 500 mL via INTRAVENOUS

## 2015-09-06 MED ORDER — SODIUM CHLORIDE 0.9 % IV SOLN: INTRAVENOUS | Status: DC

## 2015-09-06 NOTE — ED Notes (Signed)
PerEMS: Pt from [facility] reports cp x 1.5 hours, midsternal.  Pt describes pain as sharp and pressure. EKG showed RBBB.  Lung sounds clear. 70/46  Initial pressure, 92/52 at this time, cbg 106, hr 62. Pt had 1 bolus 1000 cc in route to hospital.

## 2015-09-06 NOTE — ED Notes (Signed)
Was given 1.5 L prior to arrival by EMS

## 2015-09-06 NOTE — ED Provider Notes (Signed)
CSN: [id number]     Arrival date & time 09/06/15  1842 History   First MD Initiated Contact with Patient 09/06/15 1948     Chief Complaint  Patient presents with  . Chest Pain     (Consider location/radiation/quality/duration/timing/severity/associated sxs/prior Treatment) HPI    David Collier is a 82 y.o. male is here by EMS for evaluation of low blood pressure. Currently, the patient's daughter, she arrived at his nursing care facility, and he "looked bad". She encouraged the nurse to take his blood pressure and it was low at 70/46. Apparently at that time he was complaining of upper abdomen, and right-sided upper chest pain. There's been no known trauma. It is unclear if he had nitroglycerin given or not. EMS report that he was given nitroglycerin. However, the daughter states he did not have any. He does not take nitroglycerin regularly, although he has taken in the past for cardiac problems. Patient has dementia and is unable to contribute to history. The patient's daughter states that she saw him 2 days ago and he was "better" then.   Level V caveat- dementia   Past Medical History  Diagnosis Date  . Hyperlipidemia   . Coronary atherosclerosis of native coronary artery     BMS circumflex and OM 2006, PTCA ISR 2010, moderate LAD disease  . DJD (degenerative joint disease)   . Macular degeneration   . Alzheimer's dementia   . CKD (chronic kidney disease), stage III   . History of stroke     Seen radiographically-left PICA; unknown to patient.  Marland Kitchen TIA (transient ischemic attack) 03/05/2014  . 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 CAD (coronary artery disease), native coronary artery     Initially diagnosed 2003 2006 catheterization with bare metal stenting of the first marginal branch of the circumflex and continuation branch of the circumflex 2010 catheterization showing normal left main, 50-60% LAD unchanged from before, proximal stent of marginal patent,  severe in-stent restenosis treated with cutting balloon angioplasty, patent stent in continuation branch with mild in-stent restenosis, occluded nondominant right coronary artery.   . Myocardial infarction ([place])     x5    1980s  sees DR. MICHAEL COOPER  . Anginal pain ([facility])     NONE IN 1 MONTH   . Hypertension   . Stroke ([place])   . Depression   . Headache   . Seizures [facility])    Past Surgical History  Procedure Laterality Date  . Appendectomy    . Cardiac catheterization      3 STENTS  AT  [facility]  80S OR 90S  . Coronary angioplasty    . Cholecystectomy    . Multiple extractions with alveoloplasty N/A 10/22/2014    Procedure: MULTIPLE EXTRACTIONS  # 4,6, 7, 8, 10, 21, 22, 23, 24, 25, 26, 27, 28 and ALVEOLOPLASTY;  Surgeon: Diona Browner, DDS;  Location: [place];  Service: Oral Surgery;  Laterality: N/A;  . Intramedullary (im) nail intertrochanteric Right 06/25/2015    Procedure: INTRAMEDULLARY (IM) NAIL INTERTROCHANTRIC;  Surgeon: Dorna Leitz, MD;  Location: [place];  Service: Orthopedics;  Laterality: Right;   Family History  Problem Relation Age of Onset  . Heart failure     Social History  Substance Use Topics  . Smoking status: Former Smoker    Types: Cigarettes    Quit date: 04/23/1966  . Smokeless tobacco: Never Used  . Alcohol Use: No    Review of Systems  Unable to perform ROS: Mental status change  Allergies  Aricept; Ativan; Atorvastatin; Namenda; Other; Simvastatin; and Sorbitan  Home Medications   Prior to Admission medications   Medication Sig Start Date End Date Taking? Authorizing Provider  aspirin EC 81 MG tablet Take 2 tablets (162 mg total) by mouth daily. 03/06/14  Yes Rexene Alberts, MD  cyanocobalamin 500 MCG tablet Take 500 mcg by mouth daily.   Yes Historical Provider, MD  divalproex (DEPAKOTE ER) 500 MG 24 hr tablet Take 750 mg by mouth at bedtime.  06/11/15  Yes Historical Provider, MD  ferrous sulfate 325 (65 FE) MG tablet Take 325 mg by mouth  daily with breakfast.   Yes Historical Provider, MD  fluticasone (VERAMYST) 27.5 MCG/SPRAY nasal spray Place 1 spray into the nose 2 (two) times daily.   Yes Historical Provider, MD  loratadine (CLARITIN) 10 MG tablet Take 10 mg by mouth daily.   Yes Historical Provider, MD  LORazepam (ATIVAN) 0.5 MG tablet Take 1 tablet (0.5 mg total) by mouth daily as needed for anxiety. 06/27/15  Yes Thurnell Lose, MD  metoprolol (LOPRESSOR) 25 MG tablet Take 1 tablet (25 mg total) by mouth daily. Patient taking differently: Take 12.5 mg by mouth 2 (two) times daily.  07/12/14  Yes Rhonda G Barrett, PA-C  NITROSTAT 0.4 MG SL tablet DISSOLVE ONE TABLET UNDER THE TONGUE EVERY 5 MINUTES AS NEEDED FOR CHEST PAIN 04/12/14  Yes [name], MD  QUEtiapine (SEROQUEL) 25 MG tablet Take 25 mg by mouth at bedtime.  06/10/15  Yes Historical Provider, MD  Red Yeast Rice 600 MG CAPS Take 1 capsule by mouth every morning.    Yes Historical Provider, MD  sertraline (ZOLOFT) 100 MG tablet Take 100 mg by mouth daily.   Yes Historical Provider, MD  Skin Protectants, Misc. (EUCERIN) cream Apply 1 application topically 2 (two) times daily.   Yes Historical Provider, MD  clopidogrel (PLAVIX) 75 MG tablet Take 1 tablet (75 mg total) by mouth daily. Patient not taking: Reported on 09/06/2015 06/27/15   Thurnell Lose, MD  docusate sodium (COLACE) 100 MG capsule Take 1 capsule (100 mg total) by mouth 2 (two) times daily. Patient not taking: Reported on 09/06/2015 06/27/15   Thurnell Lose, MD  HYDROcodone-acetaminophen (NORCO) 5-325 MG tablet Take 1 tablet by mouth every 6 (six) hours as needed for moderate pain or severe pain. Patient not taking: Reported on 09/06/2015 06/25/15   Gary Fleet, PA-C  pantoprazole (PROTONIX) 20 MG tablet Take 1 tablet (20 mg total) by mouth daily. 09/06/15   Daleen Bo, MD   BP 118/60 mmHg  Pulse 68  Temp(Src) 97.6 F (36.4 C) (Rectal)  Resp 11  Wt 195 lb (88.451 kg)  SpO2 98% Physical Exam   Constitutional: He appears well-developed.  Elderly, frail  HENT:  Head: Normocephalic and atraumatic.  Right Ear: External ear normal.  Left Ear: External ear normal.  Eyes: Conjunctivae and EOM are normal. Pupils are equal, round, and reactive to light.  Neck: Normal range of motion and phonation normal. Neck supple.  Cardiovascular: Normal rate, regular rhythm and normal heart sounds.   Pulmonary/Chest: Effort normal and breath sounds normal. He exhibits no bony tenderness.  Chest is nontender to palpation, examined with patient's daughter at bedside.  Abdominal: Soft. He exhibits no mass. There is no tenderness. There is no rebound and no guarding.  Entire abdomen is nontender to palpation, examined with patient's daughter at the bedside.  Genitourinary:  Normal anus. Moderate amount of soft brown stool in  the rectal vault, which is heme positive. No fecal impaction.  Musculoskeletal: Normal range of motion. He exhibits no edema or tenderness.  Neurological: He is alert. No cranial nerve deficit or sensory deficit. He exhibits normal muscle tone. Coordination normal.  Skin: Skin is warm, dry and intact.  Psychiatric: He has a normal mood and affect. His behavior is normal. Judgment and thought content normal.  Nursing note and vitals reviewed.   ED Course  Procedures (including critical care time)  Initial clinical evaluation- nonspecific pain, resolved upon arrival, in the emergency department. Episode of hypotension is nonspecific.  No known or documented fever.SIRS negative on arrival. Will evaluate, with labs, imaging, and reevaluate.  Medications  0.9 %  sodium chloride infusion (not administered)  sodium chloride 0.9 % bolus 500 mL (500 mLs Intravenous New Bag/Given 09/06/15 2109)  sodium chloride 0.9 % bolus 500 mL (0 mLs Intravenous Stopped 09/06/15 2108)    Patient Vitals for the past 24 hrs:  BP Temp Temp src Pulse Resp SpO2 Weight  09/06/15 2130 118/60 mmHg - - 68 11  98 % -  09/06/15 2100 116/68 mmHg - - 70 14 100 % -  09/06/15 2036 - 97.6 F (36.4 C) Rectal - - - -  09/06/15 2000 (!) 114/54 mmHg - - 62 16 100 % -  09/06/15 1930 104/57 mmHg - - 62 16 99 % -  09/06/15 1849 (!) 92/54 mmHg - - - - - -  09/06/15 1848 - 98.2 F (36.8 C) Oral 63 17 100 % 195 lb (88.451 kg)    9:45PM Reevaluation with update and discussion. After initial assessment and treatment, an updated evaluation reveals He remains comfortable. No further complaints. Findings discussed with patient's family members and all questions were answered.Daleen Bo L     Labs Review Labs Reviewed  CBC WITH DIFFERENTIAL/PLATELET - Abnormal; Notable for the following:    RBC 2.99 (*)    Hemoglobin 9.0 (*)    HCT 28.9 (*)    All other components within normal limits  COMPREHENSIVE METABOLIC PANEL - Abnormal; Notable for the following:    Glucose, Bld 102 (*)    BUN 29 (*)    Creatinine, Ser 1.46 (*)    Calcium 8.0 (*)    Total Protein 6.2 (*)    Albumin 3.2 (*)    AST 10 (*)    ALT 10 (*)    GFR calc non Af Amer 42 (*)    GFR calc Af Amer 49 (*)    All other components within normal limits  I-STAT CHEM 8, ED - Abnormal; Notable for the following:    BUN 27 (*)    Creatinine, Ser 1.40 (*)    Hemoglobin 10.2 (*)    HCT 30.0 (*)    All other components within normal limits  CULTURE, BLOOD (ROUTINE X 2)  CULTURE, BLOOD (ROUTINE X 2)  URINE CULTURE  URINALYSIS, ROUTINE W REFLEX MICROSCOPIC (NOT AT [facility])  I-STAT TROPOININ, ED  I-STAT CG4 LACTIC ACID, ED  POC OCCULT BLOOD, ED    Imaging Review Dg Chest Portable 1 View  09/06/2015  CLINICAL DATA:  Mid sternal chest pain for 1.5 hours, history hypertension, coronary artery disease post MI and coronary angioplasty, stroke, TIA, chronic kidney disease, Alzheimer's, former smoker EXAM: PORTABLE CHEST 1 VIEW COMPARISON:  Portable exam 1955 hours compared to 06/24/2015 FINDINGS: Upper normal heart size. Atherosclerotic calcification  aorta. Mediastinal contours and pulmonary vascularity normal. Bibasilar atelectasis. Lungs otherwise clear. No pleural  effusion or pneumothorax. Bones demineralized. IMPRESSION: Bibasilar atelectasis. Electronically Signed   By: Lavonia Dana M.D.   On: 09/06/2015 20:12   I have personally reviewed and evaluated these images and lab results as part of my medical decision-making.   EKG Interpretation   Date/Time:  Tuesday Sep 06 2015 18:47:53 EDT Ventricular Rate:  63 PR Interval:    QRS Duration: 141 QT Interval:  449 QTC Calculation: 460 R Axis:   24 Text Interpretation:  Sinus rhythm Right bundle branch block since last  tracing no significant change Confirmed by Umer Harig  MD, Vira Agar [id number]) on  09/06/2015 7:49:06 PM      MDM   Final diagnoses:  Epigastric pain  Gastrointestinal hemorrhage, unspecified gastritis, unspecified gastrointestinal hemorrhage type  Transient hypotension    Nonspecific transient upper abdominal, and right upper chest  Pain. Pain resolved on arrival. Blood pressure, which was low on arrival, improved with IV fluids. Evaluation shows persistent anemia, since March, immediately following his hip surgery. Suspect slow gastrointestinal leak. MCV normal. Doubt hemodynamic instability or anemia related to the low blood pressure today. Doubt ACS, PE or pneumonia.  Nursing Notes Reviewed/ Care Coordinated Applicable Imaging Reviewed Interpretation of Laboratory Data incorporated into ED treatment  The patient appears reasonably screened and/or stabilized for discharge and I doubt any other medical condition or other [facility] requiring further screening, evaluation, or treatment in the ED at this time prior to discharge.  Plan: Home Medications- stop Ranitidine, Start Protonix; Home Treatments- rest, fluids; return here if the recommended treatment, does not improve the symptoms; Recommended follow up- PCP 1 week for labs and recheck    Daleen Bo, MD 09/06/15  2203

## 2015-09-06 NOTE — Discharge Instructions (Signed)
We are changing his medicine for acid from ranitidine to Protonix. Try to encourage him to drink plenty of fluids, especially water. Follow-up with his doctor for a blood check in 1 week, sooner if needed, for problems.    Abdominal Pain, Adult Many things can cause abdominal pain. Usually, abdominal pain is not caused by a disease and will improve without treatment. It can often be observed and treated at home. Your health care provider will do a physical exam and possibly order blood tests and X-rays to help determine the seriousness of your pain. However, in many cases, more time must pass before a clear cause of the pain can be found. Before that point, your health care provider may not know if you need more testing or further treatment. HOME CARE INSTRUCTIONS Monitor your abdominal pain for any changes. The following actions may help to alleviate any discomfort you are experiencing:  Only take over-the-counter or prescription medicines as directed by your health care provider.  Do not take laxatives unless directed to do so by your health care provider.  Try a clear liquid diet (broth, tea, or water) as directed by your health care provider. Slowly move to a bland diet as tolerated. SEEK MEDICAL CARE IF:  You have unexplained abdominal pain.  You have abdominal pain associated with nausea or diarrhea.  You have pain when you urinate or have a bowel movement.  You experience abdominal pain that wakes you in the night.  You have abdominal pain that is worsened or improved by eating food.  You have abdominal pain that is worsened with eating fatty foods.  You have a fever. SEEK IMMEDIATE MEDICAL CARE IF:  Your pain does not go away within 2 hours.  You keep throwing up (vomiting).  Your pain is felt only in portions of the abdomen, such as the right side or the left lower portion of the abdomen.  You pass bloody or black tarry stools. MAKE SURE YOU:  Understand these  instructions.  Will watch your condition.  Will get help right away if you are not doing well or get worse.   This information is not intended to replace advice given to you by your health care provider. Make sure you discuss any questions you have with your health care provider.   Document Released: 01/17/2005 Document Revised: 12/29/2014 Document Reviewed: 12/17/2012 Elsevier Interactive Patient Education 2016 Elsevier Inc[employer]  Gastrointestinal Bleeding Gastrointestinal (GI) bleeding means there is bleeding somewhere along the digestive tract, between the mouth and anus. CAUSES  There are many different problems that can cause GI bleeding. Possible causes include:  Esophagitis. This is inflammation, irritation, or swelling of the esophagus.  Hemorrhoids.These are veins that are full of blood (engorged) in the rectum. They cause pain, inflammation, and may bleed.  Anal fissures.These are areas of painful tearing which may bleed. They are often caused by passing hard stool.  Diverticulosis.These are pouches that form on the colon over time, with age, and may bleed significantly.  Diverticulitis.This is inflammation in areas with diverticulosis. It can cause pain, fever, and bloody stools, although bleeding is rare.  Polyps and cancer. Colon cancer often starts out as precancerous polyps.  Gastritis and ulcers.Bleeding from the upper gastrointestinal tract (near the stomach) may travel through the intestines and produce black, sometimes tarry, often bad smelling stools. In certain cases, if the bleeding is fast enough, the stools may not be black, but red. This condition may be life-threatening. SYMPTOMS   Vomiting bright red  blood or material that looks like coffee grounds.  Bloody, black, or tarry stools. DIAGNOSIS  Your caregiver may diagnose your condition by taking your history and performing a physical exam. More tests may be needed, including:  X-rays and other imaging  tests.  Esophagogastroduodenoscopy (EGD). This test uses a flexible, lighted tube to look at your esophagus, stomach, and small intestine.  Colonoscopy. This test uses a flexible, lighted tube to look at your colon. TREATMENT  Treatment depends on the cause of your bleeding.   For bleeding from the esophagus, stomach, small intestine, or colon, the caregiver doing your EGD or colonoscopy may be able to stop the bleeding as part of the procedure.  Inflammation or infection of the colon can be treated with medicines.  Many rectal problems can be treated with creams, suppositories, or warm baths.  Surgery is sometimes needed.  Blood transfusions are sometimes needed if you have lost a lot of blood. If bleeding is slow, you may be allowed to go home. If there is a lot of bleeding, you will need to stay in the hospital for observation. HOME CARE INSTRUCTIONS   Take any medicines exactly as prescribed.  Keep your stools soft by eating foods that are high in fiber. These foods include whole grains, legumes, fruits, and vegetables. Prunes (1 to 3 a day) work well for many people.  Drink enough fluids to keep your urine clear or pale yellow. SEEK IMMEDIATE MEDICAL CARE IF:   Your bleeding increases.  You feel lightheaded, weak, or you faint.  You have severe cramps in your back or abdomen.  You pass large blood clots in your stool.  Your problems are getting worse. MAKE SURE YOU:   Understand these instructions.  Will watch your condition.  Will get help right away if you are not doing well or get worse.   This information is not intended to replace advice given to you by your health care provider. Make sure you discuss any questions you have with your health care provider.   Document Released: 04/06/2000 Document Revised: 03/26/2012 Document Reviewed: 09/27/2014 Elsevier Interactive Patient Education [employer].

## 2015-09-06 NOTE — ED Notes (Signed)
Family and caregiver state understanding of care given and follow up instructions.  Pt returned via [facility]

## 2015-09-07 DIAGNOSIS — R279 Unspecified lack of coordination: Secondary | ICD-10-CM | POA: Diagnosis not present

## 2015-09-07 DIAGNOSIS — Z743 Need for continuous supervision: Secondary | ICD-10-CM | POA: Diagnosis not present

## 2015-09-07 LAB — POC OCCULT BLOOD, ED: Fecal Occult Bld: POSITIVE — AB

## 2015-09-08 LAB — URINE CULTURE: Culture: NO GROWTH

## 2015-09-11 LAB — CULTURE, BLOOD (ROUTINE X 2)
CULTURE: NO GROWTH
Culture: NO GROWTH

## 2015-09-13 DIAGNOSIS — N390 Urinary tract infection, site not specified: Secondary | ICD-10-CM | POA: Diagnosis not present

## 2015-09-13 DIAGNOSIS — S37099S Other injury of unspecified kidney, sequela: Secondary | ICD-10-CM | POA: Diagnosis not present

## 2015-09-13 DIAGNOSIS — I10 Essential (primary) hypertension: Secondary | ICD-10-CM | POA: Diagnosis not present

## 2015-09-13 DIAGNOSIS — G9340 Encephalopathy, unspecified: Secondary | ICD-10-CM | POA: Diagnosis not present

## 2015-09-16 DIAGNOSIS — W19XXXA Unspecified fall, initial encounter: Secondary | ICD-10-CM | POA: Diagnosis not present

## 2015-09-16 DIAGNOSIS — Z9181 History of falling: Secondary | ICD-10-CM | POA: Diagnosis not present

## 2015-09-17 ENCOUNTER — Inpatient Hospital Stay (HOSPITAL_COMMUNITY): Payer: Medicare Other

## 2015-09-17 ENCOUNTER — Emergency Department (HOSPITAL_COMMUNITY): Payer: Medicare Other

## 2015-09-17 ENCOUNTER — Inpatient Hospital Stay: Admit: 2015-09-17 | Payer: Self-pay | Admitting: Orthopedic Surgery

## 2015-09-17 ENCOUNTER — Inpatient Hospital Stay (HOSPITAL_COMMUNITY)
Admission: EM | Admit: 2015-09-17 | Discharge: 2015-09-20 | DRG: 481 | Disposition: A | Discharge status: Skilled Nursing Facility | Payer: Medicare Other | Attending: Family Medicine | Admitting: Family Medicine

## 2015-09-17 ENCOUNTER — Inpatient Hospital Stay (HOSPITAL_COMMUNITY): Payer: Medicare Other | Admitting: Anesthesiology

## 2015-09-17 ENCOUNTER — Encounter (HOSPITAL_COMMUNITY)
Admission: EM | Disposition: A | Discharge status: Skilled Nursing Facility | Payer: Self-pay | Source: Home / Self Care | Attending: Family Medicine

## 2015-09-17 ENCOUNTER — Encounter (HOSPITAL_COMMUNITY): Payer: Self-pay

## 2015-09-17 DIAGNOSIS — N183 Chronic kidney disease, stage 3 unspecified: Secondary | ICD-10-CM | POA: Diagnosis present

## 2015-09-17 DIAGNOSIS — F0390 Unspecified dementia without behavioral disturbance: Secondary | ICD-10-CM | POA: Diagnosis not present

## 2015-09-17 DIAGNOSIS — K219 Gastro-esophageal reflux disease without esophagitis: Secondary | ICD-10-CM | POA: Diagnosis not present

## 2015-09-17 DIAGNOSIS — I129 Hypertensive chronic kidney disease with stage 1 through stage 4 chronic kidney disease, or unspecified chronic kidney disease: Secondary | ICD-10-CM | POA: Diagnosis not present

## 2015-09-17 DIAGNOSIS — S299XXA Unspecified injury of thorax, initial encounter: Secondary | ICD-10-CM | POA: Diagnosis not present

## 2015-09-17 DIAGNOSIS — I25811 Atherosclerosis of native coronary artery of transplanted heart without angina pectoris: Secondary | ICD-10-CM | POA: Diagnosis not present

## 2015-09-17 DIAGNOSIS — S72041A Displaced fracture of base of neck of right femur, initial encounter for closed fracture: Secondary | ICD-10-CM | POA: Diagnosis not present

## 2015-09-17 DIAGNOSIS — Z888 Allergy status to other drugs, medicaments and biological substances status: Secondary | ICD-10-CM | POA: Diagnosis not present

## 2015-09-17 DIAGNOSIS — Z7902 Long term (current) use of antithrombotics/antiplatelets: Secondary | ICD-10-CM

## 2015-09-17 DIAGNOSIS — Z955 Presence of coronary angioplasty implant and graft: Secondary | ICD-10-CM | POA: Diagnosis not present

## 2015-09-17 DIAGNOSIS — Z7951 Long term (current) use of inhaled steroids: Secondary | ICD-10-CM

## 2015-09-17 DIAGNOSIS — R1312 Dysphagia, oropharyngeal phase: Secondary | ICD-10-CM | POA: Diagnosis not present

## 2015-09-17 DIAGNOSIS — N390 Urinary tract infection, site not specified: Secondary | ICD-10-CM | POA: Diagnosis not present

## 2015-09-17 DIAGNOSIS — Z419 Encounter for procedure for purposes other than remedying health state, unspecified: Secondary | ICD-10-CM

## 2015-09-17 DIAGNOSIS — S72009A Fracture of unspecified part of neck of unspecified femur, initial encounter for closed fracture: Secondary | ICD-10-CM | POA: Diagnosis present

## 2015-09-17 DIAGNOSIS — G309 Alzheimer's disease, unspecified: Secondary | ICD-10-CM | POA: Diagnosis present

## 2015-09-17 DIAGNOSIS — R278 Other lack of coordination: Secondary | ICD-10-CM | POA: Diagnosis not present

## 2015-09-17 DIAGNOSIS — Z87891 Personal history of nicotine dependence: Secondary | ICD-10-CM

## 2015-09-17 DIAGNOSIS — R569 Unspecified convulsions: Secondary | ICD-10-CM | POA: Diagnosis present

## 2015-09-17 DIAGNOSIS — E785 Hyperlipidemia, unspecified: Secondary | ICD-10-CM | POA: Diagnosis present

## 2015-09-17 DIAGNOSIS — B952 Enterococcus as the cause of diseases classified elsewhere: Secondary | ICD-10-CM | POA: Diagnosis not present

## 2015-09-17 DIAGNOSIS — Z66 Do not resuscitate: Secondary | ICD-10-CM | POA: Diagnosis not present

## 2015-09-17 DIAGNOSIS — S72042A Displaced fracture of base of neck of left femur, initial encounter for closed fracture: Secondary | ICD-10-CM | POA: Diagnosis not present

## 2015-09-17 DIAGNOSIS — D62 Acute posthemorrhagic anemia: Secondary | ICD-10-CM

## 2015-09-17 DIAGNOSIS — I2510 Atherosclerotic heart disease of native coronary artery without angina pectoris: Secondary | ICD-10-CM | POA: Diagnosis not present

## 2015-09-17 DIAGNOSIS — Z7982 Long term (current) use of aspirin: Secondary | ICD-10-CM | POA: Diagnosis not present

## 2015-09-17 DIAGNOSIS — I672 Cerebral atherosclerosis: Secondary | ICD-10-CM | POA: Diagnosis present

## 2015-09-17 DIAGNOSIS — S72001A Fracture of unspecified part of neck of right femur, initial encounter for closed fracture: Secondary | ICD-10-CM | POA: Diagnosis not present

## 2015-09-17 DIAGNOSIS — S72002A Fracture of unspecified part of neck of left femur, initial encounter for closed fracture: Secondary | ICD-10-CM | POA: Diagnosis not present

## 2015-09-17 DIAGNOSIS — I252 Old myocardial infarction: Secondary | ICD-10-CM | POA: Diagnosis not present

## 2015-09-17 DIAGNOSIS — M25559 Pain in unspecified hip: Secondary | ICD-10-CM | POA: Diagnosis not present

## 2015-09-17 DIAGNOSIS — F329 Major depressive disorder, single episode, unspecified: Secondary | ICD-10-CM | POA: Diagnosis not present

## 2015-09-17 DIAGNOSIS — I10 Essential (primary) hypertension: Secondary | ICD-10-CM | POA: Diagnosis present

## 2015-09-17 DIAGNOSIS — S72002S Fracture of unspecified part of neck of left femur, sequela: Secondary | ICD-10-CM | POA: Diagnosis not present

## 2015-09-17 DIAGNOSIS — F0280 Dementia in other diseases classified elsewhere without behavioral disturbance: Secondary | ICD-10-CM | POA: Diagnosis present

## 2015-09-17 DIAGNOSIS — Z8673 Personal history of transient ischemic attack (TIA), and cerebral infarction without residual deficits: Secondary | ICD-10-CM

## 2015-09-17 DIAGNOSIS — S72002D Fracture of unspecified part of neck of left femur, subsequent encounter for closed fracture with routine healing: Secondary | ICD-10-CM | POA: Diagnosis not present

## 2015-09-17 DIAGNOSIS — D6489 Other specified anemias: Secondary | ICD-10-CM | POA: Diagnosis present

## 2015-09-17 DIAGNOSIS — H3530 Unspecified macular degeneration: Secondary | ICD-10-CM | POA: Diagnosis not present

## 2015-09-17 DIAGNOSIS — R031 Nonspecific low blood-pressure reading: Secondary | ICD-10-CM | POA: Diagnosis not present

## 2015-09-17 DIAGNOSIS — W06XXXA Fall from bed, initial encounter: Secondary | ICD-10-CM | POA: Diagnosis present

## 2015-09-17 DIAGNOSIS — S2231XA Fracture of one rib, right side, initial encounter for closed fracture: Secondary | ICD-10-CM | POA: Diagnosis not present

## 2015-09-17 DIAGNOSIS — R52 Pain, unspecified: Secondary | ICD-10-CM

## 2015-09-17 DIAGNOSIS — M25552 Pain in left hip: Secondary | ICD-10-CM | POA: Diagnosis present

## 2015-09-17 DIAGNOSIS — H9193 Unspecified hearing loss, bilateral: Secondary | ICD-10-CM | POA: Diagnosis present

## 2015-09-17 DIAGNOSIS — M6281 Muscle weakness (generalized): Secondary | ICD-10-CM | POA: Diagnosis not present

## 2015-09-17 DIAGNOSIS — S72032A Displaced midcervical fracture of left femur, initial encounter for closed fracture: Principal | ICD-10-CM | POA: Diagnosis present

## 2015-09-17 DIAGNOSIS — I200 Unstable angina: Secondary | ICD-10-CM | POA: Diagnosis not present

## 2015-09-17 DIAGNOSIS — M19012 Primary osteoarthritis, left shoulder: Secondary | ICD-10-CM | POA: Diagnosis not present

## 2015-09-17 HISTORY — PX: FEMUR IM NAIL: SHX1597

## 2015-09-17 LAB — CBC WITH DIFFERENTIAL/PLATELET
BASOS PCT: 0 %
Basophils Absolute: 0 10*3/uL (ref 0.0–0.1)
Eosinophils Absolute: 0.1 10*3/uL (ref 0.0–0.7)
Eosinophils Relative: 1 %
HEMATOCRIT: 30.4 % — AB (ref 39.0–52.0)
HEMOGLOBIN: 9.4 g/dL — AB (ref 13.0–17.0)
LYMPHS PCT: 6 %
Lymphs Abs: 0.9 10*3/uL (ref 0.7–4.0)
MCH: 29.3 pg (ref 26.0–34.0)
MCHC: 30.9 g/dL (ref 30.0–36.0)
MCV: 94.7 fL (ref 78.0–100.0)
MONOS PCT: 9 %
Monocytes Absolute: 1.2 10*3/uL — ABNORMAL HIGH (ref 0.1–1.0)
NEUTROS ABS: 12.2 10*3/uL — AB (ref 1.7–7.7)
NEUTROS PCT: 84 %
Platelets: 191 10*3/uL (ref 150–400)
RBC: 3.21 MIL/uL — ABNORMAL LOW (ref 4.22–5.81)
RDW: 14.5 % (ref 11.5–15.5)
WBC: 14.4 10*3/uL — ABNORMAL HIGH (ref 4.0–10.5)

## 2015-09-17 LAB — BASIC METABOLIC PANEL
ANION GAP: 5 (ref 5–15)
BUN: 32 mg/dL — ABNORMAL HIGH (ref 6–20)
CHLORIDE: 108 mmol/L (ref 101–111)
CO2: 26 mmol/L (ref 22–32)
Calcium: 8.6 mg/dL — ABNORMAL LOW (ref 8.9–10.3)
Creatinine, Ser: 1.32 mg/dL — ABNORMAL HIGH (ref 0.61–1.24)
GFR calc Af Amer: 55 mL/min — ABNORMAL LOW (ref >60)
GFR, EST NON AFRICAN AMERICAN: 48 mL/min — AB (ref >60)
GLUCOSE: 134 mg/dL — AB (ref 65–99)
POTASSIUM: 4.2 mmol/L (ref 3.5–5.1)
Sodium: 139 mmol/L (ref 135–145)

## 2015-09-17 LAB — PROTIME-INR
INR: 1.32 (ref 0.00–1.49)
Prothrombin Time: 16.5 seconds — ABNORMAL HIGH (ref 11.6–15.2)

## 2015-09-17 LAB — TROPONIN I: Troponin I: <0.03 ng/mL (ref <0.031)

## 2015-09-17 LAB — PREPARE RBC (CROSSMATCH)

## 2015-09-17 SURGERY — INSERTION, INTRAMEDULLARY ROD, FEMUR
Anesthesia: General | Site: Hip | Laterality: Left

## 2015-09-17 MED ORDER — METHOCARBAMOL 500 MG PO TABS
500.0000 mg | ORAL_TABLET | Freq: Four times a day (QID) | ORAL | Status: DC | PRN
  Administered 2015-09-18 – 2015-09-19 (×2): 500 mg via ORAL
  Filled 2015-09-17 (×2): qty 1

## 2015-09-17 MED ORDER — CEFAZOLIN SODIUM-DEXTROSE 2-3 GM-% IV SOLR
INTRAVENOUS | Status: DC | PRN
  Administered 2015-09-17: 2 g via INTRAVENOUS

## 2015-09-17 MED ORDER — LACTATED RINGERS IV SOLN
INTRAVENOUS | Status: DC | PRN
  Administered 2015-09-17: 22:00:00 via INTRAVENOUS

## 2015-09-17 MED ORDER — FENTANYL CITRATE (PF) 100 MCG/2ML IJ SOLN: 25.0000 ug | INTRAMUSCULAR | Status: DC | PRN

## 2015-09-17 MED ORDER — DEXTROSE-NACL 5-0.45 % IV SOLN
INTRAVENOUS | Status: DC
Start: 2015-09-17 — End: 2015-09-20
  Administered 2015-09-17: via INTRAVENOUS

## 2015-09-17 MED ORDER — FENTANYL CITRATE (PF) 250 MCG/5ML IJ SOLN
INTRAMUSCULAR | Status: DC | PRN
  Administered 2015-09-17: 100 ug via INTRAVENOUS

## 2015-09-17 MED ORDER — EPHEDRINE SULFATE 50 MG/ML IJ SOLN
INTRAMUSCULAR | Status: DC | PRN
  Administered 2015-09-17: 5 mg via INTRAVENOUS
  Administered 2015-09-17: 10 mg via INTRAVENOUS

## 2015-09-17 MED ORDER — SUCCINYLCHOLINE CHLORIDE 20 MG/ML IJ SOLN
INTRAMUSCULAR | Status: DC | PRN
  Administered 2015-09-17: 100 mg via INTRAVENOUS

## 2015-09-17 MED ORDER — HYDROCODONE-ACETAMINOPHEN 5-325 MG PO TABS: 1.0000 | ORAL_TABLET | Freq: Four times a day (QID) | ORAL | Status: DC | PRN

## 2015-09-17 MED ORDER — MORPHINE SULFATE (PF) 2 MG/ML IV SOLN
0.5000 mg | Freq: Once | INTRAVENOUS | Status: AC
  Administered 2015-09-17: 0.5 mg via INTRAVENOUS
  Filled 2015-09-17: qty 1

## 2015-09-17 MED ORDER — DEXTROSE 5 % IV SOLN
500.0000 mg | Freq: Four times a day (QID) | INTRAVENOUS | Status: DC | PRN
  Filled 2015-09-17 (×2): qty 5

## 2015-09-17 MED ORDER — SUGAMMADEX SODIUM 200 MG/2ML IV SOLN
INTRAVENOUS | Status: DC | PRN
  Administered 2015-09-17: 200 mg via INTRAVENOUS

## 2015-09-17 MED ORDER — ONDANSETRON HCL 4 MG/2ML IJ SOLN
INTRAMUSCULAR | Status: DC | PRN
  Administered 2015-09-17: 4 mg via INTRAVENOUS

## 2015-09-17 MED ORDER — SODIUM CHLORIDE 0.9 % IV SOLN: INTRAVENOUS | Status: DC

## 2015-09-17 MED ORDER — SODIUM CHLORIDE 0.9 % IV SOLN
INTRAVENOUS | Status: AC
  Administered 2015-09-17: via INTRAVENOUS

## 2015-09-17 MED ORDER — 0.9 % SODIUM CHLORIDE (POUR BTL) OPTIME
TOPICAL | Status: DC | PRN
  Administered 2015-09-17: 1000 mL

## 2015-09-17 MED ORDER — PROPOFOL 10 MG/ML IV BOLUS
INTRAVENOUS | Status: DC | PRN
  Administered 2015-09-17: 150 mg via INTRAVENOUS

## 2015-09-17 MED ORDER — ROCURONIUM BROMIDE 100 MG/10ML IV SOLN
INTRAVENOUS | Status: DC | PRN
  Administered 2015-09-17: 30 mg via INTRAVENOUS

## 2015-09-17 MED ORDER — DEXTROSE 5 % IV SOLN
10.0000 mg | INTRAVENOUS | Status: DC | PRN
  Administered 2015-09-17: 10 ug/min via INTRAVENOUS

## 2015-09-17 SURGICAL SUPPLY — 44 items
BIT DRILL 4.3MMS DISTAL GRDTED (BIT) ×1 IMPLANT
BLADE SURG ROTATE 9660 (MISCELLANEOUS) ×2 IMPLANT
COVER MAYO STAND STRL (DRAPES) ×2 IMPLANT
COVER PERINEAL POST (MISCELLANEOUS) ×2 IMPLANT
COVER SURGICAL LIGHT HANDLE (MISCELLANEOUS) ×4 IMPLANT
DRAPE STERI IOBAN 125X83 (DRAPES) ×2 IMPLANT
DRILL 4.3MMS DISTAL GRADUATED (BIT) ×2
DRSG MEPILEX BORDER 4X12 (GAUZE/BANDAGES/DRESSINGS) ×2 IMPLANT
DRSG MEPILEX BORDER 4X4 (GAUZE/BANDAGES/DRESSINGS) ×2 IMPLANT
DRSG MEPILEX BORDER 4X8 (GAUZE/BANDAGES/DRESSINGS) ×2 IMPLANT
DURAPREP 26ML APPLICATOR (WOUND CARE) ×2 IMPLANT
ELECT CAUTERY BLADE 6.4 (BLADE) ×2 IMPLANT
ELECT REM PT RETURN 9FT ADLT (ELECTROSURGICAL) ×2
ELECTRODE REM PT RTRN 9FT ADLT (ELECTROSURGICAL) ×1 IMPLANT
FACESHIELD STD STERILE (MASK) ×2 IMPLANT
GAUZE XEROFORM 5X9 LF (GAUZE/BANDAGES/DRESSINGS) ×2 IMPLANT
GLOVE BIOGEL PI IND STRL 8 (GLOVE) ×2 IMPLANT
GLOVE BIOGEL PI INDICATOR 8 (GLOVE) ×2
GLOVE ECLIPSE 7.5 STRL STRAW (GLOVE) ×4 IMPLANT
GOWN STRL REUS W/ TWL LRG LVL3 (GOWN DISPOSABLE) ×1 IMPLANT
GOWN STRL REUS W/ TWL XL LVL3 (GOWN DISPOSABLE) ×2 IMPLANT
GOWN STRL REUS W/TWL LRG LVL3 (GOWN DISPOSABLE) ×1
GOWN STRL REUS W/TWL XL LVL3 (GOWN DISPOSABLE) ×2
HIP FRAC NAIL LEFT 11X360MM (Orthopedic Implant) ×2 IMPLANT
KIT BASIN OR (CUSTOM PROCEDURE TRAY) ×2 IMPLANT
KIT ROOM TURNOVER OR (KITS) ×2 IMPLANT
LINER BOOT UNIVERSAL DISP (MISCELLANEOUS) ×2 IMPLANT
NAIL HIP FRAC LEFT 11X360MM (Orthopedic Implant) ×1 IMPLANT
NS IRRIG 1000ML POUR BTL (IV SOLUTION) ×2 IMPLANT
PACK GENERAL/GYN (CUSTOM PROCEDURE TRAY) ×2 IMPLANT
PAD ARMBOARD 7.5X6 YLW CONV (MISCELLANEOUS) ×4 IMPLANT
PIN GUIDE 3.2 903003004 (MISCELLANEOUS) ×4 IMPLANT
SCREW BONE CORTICAL 5.0X50 (Screw) ×2 IMPLANT
SCREW LAG 10.5MMX105MM HFN (Screw) ×2 IMPLANT
SCREWDRIVER HEX TIP 3.5MM (MISCELLANEOUS) ×2 IMPLANT
STAPLER VISISTAT 35W (STAPLE) ×2 IMPLANT
SUT VIC AB 0 CTB1 27 (SUTURE) ×4 IMPLANT
SUT VIC AB 1 CT1 27 (SUTURE) ×1
SUT VIC AB 1 CT1 27XBRD ANBCTR (SUTURE) ×1 IMPLANT
SUT VIC AB 1 CTB1 27 (SUTURE) ×2 IMPLANT
SUT VIC AB 2-0 CTB1 (SUTURE) ×2 IMPLANT
TOWEL OR 17X24 6PK STRL BLUE (TOWEL DISPOSABLE) ×2 IMPLANT
TOWEL OR 17X26 10 PK STRL BLUE (TOWEL DISPOSABLE) ×2 IMPLANT
WIRE G INTRAMED 80X3.0 2810010 (MISCELLANEOUS) ×2 IMPLANT

## 2015-09-17 NOTE — ED Notes (Signed)
Incontinent of urine, hygiene care given and patient more comfortable. Placed on 3 liters of o2 via cannula due to low o2 sat after medication.

## 2015-09-17 NOTE — ED Notes (Addendum)
Pt brought in by EMS from [facility]. Pt fell from bed.on Friday 09/16/15, mobile xray on right hip obtained per report negative for fx. Nursing home note reports pt complaining of pain while being turned to left side, screaming when leg touched and during ADL,s. Pt is wheelchair bound

## 2015-09-17 NOTE — Anesthesia Preprocedure Evaluation (Addendum)
Anesthesia Evaluation  Patient identified by MRN, date of birth, ID band Patient awake and Patient confused    Reviewed: Allergy & Precautions, NPO status , Patient's Chart, lab work & pertinent test results, reviewed documented beta blocker date and time , Unable to perform ROS - Chart review only  History of Anesthesia Complications Negative for: history of anesthetic complications  Airway Mallampati: II  TM Distance: >3 FB Neck ROM: Full    Dental  (+) Edentulous Upper, Edentulous Lower   Pulmonary COPD, former smoker (quit 1968),    breath sounds clear to auscultation       Cardiovascular hypertension, Pt. on medications and Pt. on home beta blockers (-) angina+ CAD, + Past MI and + Cardiac Stents   Rhythm:Regular Rate:Normal  '15 ECHO: EF [id number], grade 1 diastolic dysfunction, valves OK   Neuro/Psych Anxiety Depression Alzheimer's Depakote is for mood stabilization, no seizure history TIACVA    GI/Hepatic Neg liver ROS, GERD  Controlled,  Endo/Other  negative endocrine ROS  Renal/GU Renal InsufficiencyRenal disease (creat 1.32)     Musculoskeletal  (+) Arthritis ,   Abdominal   Peds  Hematology  (+) Blood dyscrasia (Hb 9.4), ,   Anesthesia Other Findings   Reproductive/Obstetrics                         Anesthesia Physical Anesthesia Plan  ASA: IV  Anesthesia Plan: General   Post-op Pain Management:    Induction: Intravenous and Rapid sequence  Airway Management Planned: Oral ETT  Additional Equipment:   Intra-op Plan:   Post-operative Plan: Extubation in OR  Informed Consent: I have reviewed the patients History and Physical, chart, labs and discussed the procedure including the risks, benefits and alternatives for the proposed anesthesia with the patient or authorized representative who has indicated his/her understanding and acceptance.   History available from chart  only and Consent reviewed with POA  Plan Discussed with: Surgeon and CRNA  Anesthesia Plan Comments: (Plan routine monitors, GETA)        Anesthesia Quick Evaluation

## 2015-09-17 NOTE — Brief Op Note (Signed)
09/17/2015  11:12 PM  PATIENT:  David Collier  82 y.o. male  PRE-OPERATIVE DIAGNOSIS:  LEFT FEMORAL NECK FRACTURE  POST-OPERATIVE DIAGNOSIS:  LEFT FEMORAL NECK FRACTURE  PROCEDURE:  Procedure(s): INTRAMEDULLARY (IM) NAIL FEMORAL (Left)  SURGEON:  Surgeon(s) and Role:    * Dorna Leitz, MD - Primary  PHYSICIAN ASSISTANT:   ASSISTANTS: bethune   ANESTHESIA:   general  EBL:  Total I/O In: 500 [I.V.:500] Out: 215 [Urine:115; Blood:100]  BLOOD ADMINISTERED:none  DRAINS: none   LOCAL MEDICATIONS USED:  NONE  SPECIMEN:  No Specimen  DISPOSITION OF SPECIMEN:  N/A  COUNTS:  YES  TOURNIQUET:  * No tourniquets in log *  DICTATION: .Other Dictation: Dictation Number [id number]  PLAN OF CARE: Admit to inpatient   PATIENT DISPOSITION:  PACU - hemodynamically stable.   Delay start of Pharmacological VTE agent (>24hrs) due to surgical blood loss or risk of bleeding: no

## 2015-09-17 NOTE — Transfer of Care (Signed)
Immediate Anesthesia Transfer of Care Note  Patient: David Collier  Procedure(s) Performed: Procedure(s): INTRAMEDULLARY (IM) NAIL FEMORAL (Left)  Patient Location: PACU  Anesthesia Type:General  Level of Consciousness: sedated  Airway & Oxygen Therapy: Patient Spontanous Breathing and Patient connected to face mask oxygen  Post-op Assessment: Report given to RN and Post -op Vital signs reviewed and stable  Post vital signs: Reviewed and stable  Last Vitals:  Filed Vitals:   09/17/15 1900 09/17/15 1930  BP: 151/83 120/95  Pulse: 96 96  Resp: 21 23    Last Pain: There were no vitals filed for this visit.       Complications: No apparent anesthesia complications

## 2015-09-17 NOTE — H&P (Addendum)
History and Physical    David Collier [id number] DOB: 01/22/1929 DOA: 09/17/2015  PCP: Renata Caprice, DO  Patient coming from: Nursing home.  Chief Complaint: Left hip pain.  History obtained from patient's daughter ER physician and previous charts. Patient has advanced dementia.  HPI: David Collier is a 82 y.o. male with medical history significant of advanced dementia, CAD status post stenting, chronic kidney disease stage III, chronic anemia, TIA was brought to the ER patient had persistent pain in his left hip. As per the daughter patient had a fall yesterday after trying to get out of the bed. Per the daughter patient did not hit his head or lose consciousness. X-rays in the ER shows left hip fracture. Dr. Berenice Primas on-call orthopedic surgeon was consulted at [facility] by the ER physician. Patient will be transferred to [facility] for further management of the left hip fracture. On exam patient is not in distress and has dementia and does not contribute to history. Patient specifically did not complain of any chest pain and is not short of breath. Patient had a right hip surgery done in the March of this year.  ED Course: X-rays reveal left hip fracture.  Review of Systems: As per HPI otherwise 10 point review of systems negative.    Past Medical History  Diagnosis Date  . Hyperlipidemia   . Coronary atherosclerosis of native coronary artery     BMS circumflex and OM 2006, PTCA ISR 2010, moderate LAD disease  . DJD (degenerative joint disease)   . Macular degeneration   . Alzheimer's dementia   . CKD (chronic kidney disease), stage III   . History of stroke     Seen radiographically-left PICA; unknown to patient.  Marland Kitchen TIA (transient ischemic attack) 03/05/2014  . 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 CAD (coronary artery disease), native coronary artery     Initially diagnosed 2003 2006 catheterization with bare metal stenting of the first marginal  branch of the circumflex and continuation branch of the circumflex 2010 catheterization showing normal left main, 50-60% LAD unchanged from before, proximal stent of marginal patent, severe in-stent restenosis treated with cutting balloon angioplasty, patent stent in continuation branch with mild in-stent restenosis, occluded nondominant right coronary artery.   . Myocardial infarction ([place])     x5    1980s  sees DR. MICHAEL COOPER  . Anginal pain (HCC)     NONE IN 1 MONTH   . Hypertension   . Stroke ([place])   . Depression   . Headache   . Seizures [facility])     Past Surgical History  Procedure Laterality Date  . Appendectomy    . Cardiac catheterization      3 STENTS  AT  [place]  80S OR 90S  . Coronary angioplasty    . Cholecystectomy    . Multiple extractions with alveoloplasty N/A 10/22/2014    Procedure: MULTIPLE EXTRACTIONS  # 4,6, 7, 8, 10, 21, 22, 23, 24, 25, 26, 27, 28 and ALVEOLOPLASTY;  Surgeon: Diona Browner, DDS;  Location: [place];  Service: Oral Surgery;  Laterality: N/A;  . Intramedullary (im) nail intertrochanteric Right 06/25/2015    Procedure: INTRAMEDULLARY (IM) NAIL INTERTROCHANTRIC;  Surgeon: Dorna Leitz, MD;  Location: [place];  Service: Orthopedics;  Laterality: Right;     reports that he quit smoking about 49 years ago. His smoking use included Cigarettes. He has never used smokeless tobacco. He reports that he does not drink alcohol or use  illicit drugs.  Allergies  Allergen Reactions  . Aricept [Donepezil Hcl] Other (See Comments)    Altered mental status  . Ativan [Lorazepam] Other (See Comments)    Altered mental status  . Atorvastatin Nausea And Vomiting and Other (See Comments)    Joint pain  . Namenda [Memantine] Other (See Comments)    Altered mental status  . Other     Wife says pt was given 2 different medications for his nerves but said they made him have SI.  Marland Kitchen Simvastatin Nausea And Vomiting and Other (See Comments)    Joint pain  . Sorbitan Nausea  And Vomiting    headache    Family History  Problem Relation Age of Onset  . Heart failure      Prior to Admission medications   Medication Sig Start Date End Date Taking? Authorizing Provider  aspirin EC 81 MG tablet Take 2 tablets (162 mg total) by mouth daily. 03/06/14  Yes Rexene Alberts, MD  cyanocobalamin 500 MCG tablet Take 500 mcg by mouth daily.   Yes Historical Provider, MD  divalproex (DEPAKOTE ER) 500 MG 24 hr tablet Take 750 mg by mouth at bedtime.  06/11/15  Yes Historical Provider, MD  ferrous sulfate 325 (65 FE) MG tablet Take 325 mg by mouth daily with breakfast.   Yes Historical Provider, MD  fluticasone (VERAMYST) 27.5 MCG/SPRAY nasal spray Place 1 spray into the nose 2 (two) times daily.   Yes Historical Provider, MD  loratadine (CLARITIN) 10 MG tablet Take 10 mg by mouth daily.   Yes Historical Provider, MD  LORazepam (ATIVAN) 0.5 MG tablet Take 1 tablet (0.5 mg total) by mouth daily as needed for anxiety. 06/27/15  Yes Thurnell Lose, MD  metoprolol (LOPRESSOR) 25 MG tablet Take 1 tablet (25 mg total) by mouth daily. Patient taking differently: Take 12.5 mg by mouth 2 (two) times daily.  07/12/14  Yes Rhonda G Barrett, PA-C  NITROSTAT 0.4 MG SL tablet DISSOLVE ONE TABLET UNDER THE TONGUE EVERY 5 MINUTES AS NEEDED FOR CHEST PAIN 04/12/14  Yes [name], MD  pantoprazole (PROTONIX) 20 MG tablet Take 1 tablet (20 mg total) by mouth daily. 09/06/15  Yes Daleen Bo, MD  QUEtiapine (SEROQUEL) 25 MG tablet Take 25 mg by mouth at bedtime.  06/10/15  Yes Historical Provider, MD  Red Yeast Rice 600 MG CAPS Take 1 capsule by mouth every morning.    Yes Historical Provider, MD  sertraline (ZOLOFT) 100 MG tablet Take 100 mg by mouth daily.   Yes Historical Provider, MD  Skin Protectants, Misc. (EUCERIN) cream Apply 1 application topically 2 (two) times daily.   Yes Historical Provider, MD  clopidogrel (PLAVIX) 75 MG tablet Take 1 tablet (75 mg total) by mouth daily. Patient not  taking: Reported on 09/06/2015 06/27/15   Thurnell Lose, MD  docusate sodium (COLACE) 100 MG capsule Take 1 capsule (100 mg total) by mouth 2 (two) times daily. Patient not taking: Reported on 09/06/2015 06/27/15   Thurnell Lose, MD  HYDROcodone-acetaminophen (NORCO) 5-325 MG tablet Take 1 tablet by mouth every 6 (six) hours as needed for moderate pain or severe pain. Patient not taking: Reported on 09/06/2015 06/25/15   Gary Fleet, PA-C    Physical Exam: Filed Vitals:   09/17/15 1127 09/17/15 1400 09/17/15 1430  BP: 104/66 117/62 120/71  Pulse: 96 75 85  Resp: 20    Weight: 195 lb (88.451 kg)    SpO2: 93% 95% 95%  Constitutional: Not in distress. Filed Vitals:   09/17/15 1127 09/17/15 1400 09/17/15 1430  BP: 104/66 117/62 120/71  Pulse: 96 75 85  Resp: 20    Weight: 195 lb (88.451 kg)    SpO2: 93% 95% 95%   Eyes: Anicteric no pallor. ENMT: No discharge from the ears eyes nose or mouth. Neck: No mass felt. No JVD felt. Respiratory: No rhonchi or crepitations. Cardiovascular: S1-S2 heard. Abdomen: Soft nontender bowel sounds present. Musculoskeletal: Pain on moving left hip. Skin: No rash. Neurologic: Alert awake oriented to his name. Patient is hard of hearing. Moves all extremities. Psychiatric: Patient has dementia.   Labs on Admission: I have personally reviewed following labs and imaging studies  CBC:  Recent Labs Lab 09/17/15 1308  WBC 14.4*  NEUTROABS 12.2*  HGB 9.4*  HCT 30.4*  MCV 94.7  PLT 99991111   Basic Metabolic Panel:  Recent Labs Lab 09/17/15 1308  NA 139  K 4.2  CL 108  CO2 26  GLUCOSE 134*  BUN 32*  CREATININE 1.32*  CALCIUM 8.6*   GFR: Estimated Creatinine Clearance: 45.8 mL/min (by C-G formula based on Cr of 1.32). Liver Function Tests: No results for input(s): AST, ALT, ALKPHOS, BILITOT, PROT, ALBUMIN in the last 168 hours. No results for input(s): LIPASE, AMYLASE in the last 168 hours. No results for input(s): AMMONIA in the  last 168 hours. Coagulation Profile:  Recent Labs Lab 09/17/15 1308  INR 1.32   Cardiac Enzymes:  Recent Labs Lab 09/17/15 1308  TROPONINI <0.03   BNP (last 3 results) No results for input(s): PROBNP in the last 8760 hours. HbA1C: No results for input(s): HGBA1C in the last 72 hours. CBG: No results for input(s): GLUCAP in the last 168 hours. Lipid Profile: No results for input(s): CHOL, HDL, LDLCALC, TRIG, CHOLHDL, LDLDIRECT in the last 72 hours. Thyroid Function Tests: No results for input(s): TSH, T4TOTAL, FREET4, T3FREE, THYROIDAB in the last 72 hours. Anemia Panel: No results for input(s): VITAMINB12, FOLATE, FERRITIN, TIBC, IRON, RETICCTPCT in the last 72 hours. Urine analysis:    Component Value Date/Time   COLORURINE YELLOW 09/06/2015 2030   [place] 09/06/2015 2030   LABSPEC 1.020 09/06/2015 2030   PHURINE 5.5 09/06/2015 2030   GLUCOSEU NEGATIVE 09/06/2015 2030   [place] 09/06/2015 2030   [place] 09/06/2015 2030   [place] 09/06/2015 2030   PROTEINUR NEGATIVE 09/06/2015 2030   UROBILINOGEN 0.2 03/04/2015 1840   NITRITE NEGATIVE 09/06/2015 2030   LEUKOCYTESUR NEGATIVE 09/06/2015 2030   Sepsis Labs: @LABRCNTIP (procalcitonin:4,lacticidven:4) )No results found for this or any previous visit (from the past 240 hour(s)).   Radiological Exams on Admission: Dg Chest Port 1 View  09/17/2015  CLINICAL DATA:  Status post fall, left hip fracture EXAM: PORTABLE CHEST 1 VIEW COMPARISON:  09/06/2015 FINDINGS: The heart size and mediastinal contours are within normal limits. Both lungs are clear. Possible right eighth lateral rib fracture. Mild osteoarthritis of bilateral glenohumeral joints. IMPRESSION: No active disease. Possible right eighth lateral rib fracture. Electronically Signed   By: Kathreen Devoid   On: 09/17/2015 13:22   Dg Hips Bilat With Pelvis Min 5 Views  09/17/2015  CLINICAL DATA:  FALL, BILATERAL HIP PAIN, Pt brought  in by EMS from [facility]. Pt fell from bed.on Friday 09/16/15,Nursing home note reports pt complaining of pain while being turned to left side, BEST IMAGES OBTAINED DUE TO PATIENTS CONDITION, PATIENT HELD FOR SEVERAL IMAGES. HISTORY OF HTN, STROKE, CAD, TIA, ALZHEIMER'S, DEMENTIA, CKD,  INTRAMEDULLARY (IM) NAIL INTERTROCHANTERIC OF RIGHT ON 06/25/2015 EXAM: DG HIP (WITH OR WITHOUT PELVIS) 5+V BILAT COMPARISON:  06/25/2015 and previous FINDINGS: Sliding screw and IM rod transfix right intertrochanteric fracture in near anatomic alignment. The distal into the IM rod is not included. There is impacted midcervical fracture of the left femur. No dislocation. Bony pelvis intact. Bilateral pelvic phleboliths. IMPRESSION: 1. Acute impacted midcervical left femur fracture. 2. Postop changes across the right femoral neck without acute abnormality. Electronically Signed   By: Lucrezia Europe M.D.   On: 09/17/2015 12:46    EKG: Independently reviewed. Normal sinus rhythm.  Assessment/Plan Principal Problem:   Closed left hip fracture (HCC) Active Problems:   Essential hypertension   CAD (coronary artery disease), native coronary artery   CKD (chronic kidney disease), stage III   Dementia   Personal history of transient ischemic attack (TIA) and cerebral infarction without residual deficit   Hip fracture (HCC)    #1. Left hip fracture status post mechanical fall - patient is at moderate to high risk for intermediate risk surgery for the left hip but patient has had recent surgery for the right hip in March. I have discussed with Dr. Berenice Primas on-call orthopedic surgeon. Will keep patient nothing by mouth except medication in anticipation of surgery. Dr. Tana Coast will be the accepting physician. Patient's daughter is agreeable to transfer. #2. CAD status post stenting - denies any chest pain. Restart antiplatelet agents after surgery as per orthopedic surgeon. Continue metoprolol. When necessary nitroglycerin. Patient is  intolerant to statins. #3. Chronic kidney disease stage III - creatinine appears to be at baseline. Closely follow metabolic panel. #4. Chronic anemia probably from kidney disease - follow hemoglobin. Type and screen. Transfuse if hemoglobin less than 8. Continue ferrous sulfate. #5. Hypertension - continue metoprolol. #6. History of TIA on antiplatelet agents. Intolerant to statins.  #7. History of dementia and depression - continue present medications. #8. Possible right rib fracture - no acute issues at this time closely observe. #9. Leukocytosis - probably reactionary. Patient is afebrile. Chest x-ray does not show any infiltrates. UA is pending.   DVT prophylaxis:  SCDs. Code Status:  DO NOT RESUSCITATE.  Family Communication:  patient's daughter.  Disposition Plan:  skilled nursing facility.  Consults called:  orthopedic surgeon Dr. Berenice Primas.  Admission status:  inpatient. MedSurg. Likely stay 3-4 days.    Rise Patience MD [facility] Pager [phone].  If 7PM-7AM, please contact night-coverage [email or web address] Password [email or web address]  09/17/2015, 4:05 PM

## 2015-09-17 NOTE — Consult Note (Addendum)
Reason for Consult: Right intertrochanteric hip fracture  Referring Physician: Hospitalists  David Collier is an 82 y.o. male.  HPI: The patient is an 82 year old male who fell on theleft side today. He suffered a eft basicervical hip fracture. He is transferred down to the [place] system for treatment of his intertrochanteric hip fracture leftt side. The patient will be admitted to the internal medical service and cleared for surgical intervention. We will be consult for management of his hip fracture.  Past Medical History  Diagnosis Date  . Hyperlipidemia   . Coronary atherosclerosis of native coronary artery     BMS circumflex and OM 2006, PTCA ISR 2010, moderate LAD disease  . DJD (degenerative joint disease)   . Macular degeneration   . Alzheimer's dementia   . CKD (chronic kidney disease), stage III   . History of stroke     Seen radiographically-left PICA; unknown to patient.  Marland Kitchen TIA (transient ischemic attack) 03/05/2014  . 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 CAD (coronary artery disease), native coronary artery     Initially diagnosed 2003 2006 catheterization with bare metal stenting of the first marginal branch of the circumflex and continuation branch of the circumflex 2010 catheterization showing normal left main, 50-60% LAD unchanged from before, proximal stent of marginal patent, severe in-stent restenosis treated with cutting balloon angioplasty, patent stent in continuation branch with mild in-stent restenosis, occluded nondominant right coronary artery.   . Myocardial infarction ([place])     x5    1980s  sees DR. MICHAEL COOPER  . Anginal pain (HCC)     NONE IN 1 MONTH   . Hypertension   . Stroke ([place])   . Depression   . Headache   . Seizures [facility])     Past Surgical History  Procedure Laterality Date  . Appendectomy    . Cardiac catheterization      3 STENTS  AT  [place]  80S OR 90S  . Coronary angioplasty    . Cholecystectomy    .  Multiple extractions with alveoloplasty N/A 10/22/2014    Procedure: MULTIPLE EXTRACTIONS  # 4,6, 7, 8, 10, 21, 22, 23, 24, 25, 26, 27, 28 and ALVEOLOPLASTY;  Surgeon: Diona Browner, DDS;  Location: [place];  Service: Oral Surgery;  Laterality: N/A;  . Intramedullary (im) nail intertrochanteric Right 06/25/2015    Procedure: INTRAMEDULLARY (IM) NAIL INTERTROCHANTRIC;  Surgeon: Dorna Leitz, MD;  Location: [place];  Service: Orthopedics;  Laterality: Right;    Family History  Problem Relation Age of Onset  . Heart failure      Social History:  reports that he quit smoking about 49 years ago. His smoking use included Cigarettes. He has never used smokeless tobacco. He reports that he does not drink alcohol or use illicit drugs.  Allergies:  Allergies  Allergen Reactions  . Aricept [Donepezil Hcl] Other (See Comments)    Altered mental status  . Ativan [Lorazepam] Other (See Comments)    Altered mental status  . Atorvastatin Nausea And Vomiting and Other (See Comments)    Joint pain  . Namenda [Memantine] Other (See Comments)    Altered mental status  . Other     Wife says pt was given 2 different medications for his nerves but said they made him have SI.  Marland Kitchen Simvastatin Nausea And Vomiting and Other (See Comments)    Joint pain  . Sorbitan Nausea And Vomiting    headache    Medications: I have reviewed  the patient's current medications.  Results for orders placed or performed during the hospital encounter of 09/17/15 (from the past 48 hour(s))  Basic metabolic panel     Status: Abnormal   Collection Time: 09/17/15  1:08 PM  Result Value Ref Range   Sodium 139 135 - 145 mmol/L   Potassium 4.2 3.5 - 5.1 mmol/L   Chloride 108 101 - 111 mmol/L   CO2 26 22 - 32 mmol/L   Glucose, Bld 134 (H) 65 - 99 mg/dL   BUN 32 (H) 6 - 20 mg/dL   Creatinine, Ser 1.32 (H) 0.61 - 1.24 mg/dL   Calcium 8.6 (L) 8.9 - 10.3 mg/dL   GFR calc non Af Amer 48 (L) >60 mL/min   GFR calc Af Amer 55 (L) >60 mL/min     Comment: (NOTE) The eGFR has been calculated using the CKD EPI equation. This calculation has not been validated in all clinical situations. eGFR's persistently <60 mL/min signify possible Chronic Kidney Disease.    Anion gap 5 5 - 15  Troponin I     Status: None   Collection Time: 09/17/15  1:08 PM  Result Value Ref Range   Troponin I <0.03 <0.031 ng/mL    Comment:        NO INDICATION OF MYOCARDIAL INJURY.   CBC with Differential     Status: Abnormal   Collection Time: 09/17/15  1:08 PM  Result Value Ref Range   WBC 14.4 (H) 4.0 - 10.5 K/uL   RBC 3.21 (L) 4.22 - 5.81 MIL/uL   Hemoglobin 9.4 (L) 13.0 - 17.0 g/dL   HCT 30.4 (L) 39.0 - 52.0 %   MCV 94.7 78.0 - 100.0 fL   MCH 29.3 26.0 - 34.0 pg   MCHC 30.9 30.0 - 36.0 g/dL   RDW 14.5 11.5 - 15.5 %   Platelets 191 150 - 400 K/uL   Neutrophils Relative % 84 %   Neutro Abs 12.2 (H) 1.7 - 7.7 K/uL   Lymphocytes Relative 6 %   Lymphs Abs 0.9 0.7 - 4.0 K/uL   Monocytes Relative 9 %   Monocytes Absolute 1.2 (H) 0.1 - 1.0 K/uL   Eosinophils Relative 1 %   Eosinophils Absolute 0.1 0.0 - 0.7 K/uL   Basophils Relative 0 %   Basophils Absolute 0.0 0.0 - 0.1 K/uL  Protime-INR     Status: Abnormal   Collection Time: 09/17/15  1:08 PM  Result Value Ref Range   Prothrombin Time 16.5 (H) 11.6 - 15.2 seconds   INR 1.32 0.00 - 1.49    Dg Chest Port 1 View  09/17/2015  CLINICAL DATA:  Status post fall, left hip fracture EXAM: PORTABLE CHEST 1 VIEW COMPARISON:  09/06/2015 FINDINGS: The heart size and mediastinal contours are within normal limits. Both lungs are clear. Possible right eighth lateral rib fracture. Mild osteoarthritis of bilateral glenohumeral joints. IMPRESSION: No active disease. Possible right eighth lateral rib fracture. Electronically Signed   By: Kathreen Devoid   On: 09/17/2015 13:22   Dg Hips Bilat With Pelvis Min 5 Views  09/17/2015  CLINICAL DATA:  FALL, BILATERAL HIP PAIN, Pt brought in by EMS from [facility]. Pt  fell from bed.on Friday 09/16/15,Nursing home note reports pt complaining of pain while being turned to left side, BEST IMAGES OBTAINED DUE TO PATIENTS CONDITION, PATIENT HELD FOR SEVERAL IMAGES. HISTORY OF HTN, STROKE, CAD, TIA, ALZHEIMER'S, DEMENTIA, CKD, INTRAMEDULLARY (IM) NAIL INTERTROCHANTERIC OF RIGHT ON 06/25/2015 EXAM: DG HIP (WITH  OR WITHOUT PELVIS) 5+V BILAT COMPARISON:  06/25/2015 and previous FINDINGS: Sliding screw and IM rod transfix right intertrochanteric fracture in near anatomic alignment. The distal into the IM rod is not included. There is impacted midcervical fracture of the left femur. No dislocation. Bony pelvis intact. Bilateral pelvic phleboliths. IMPRESSION: 1. Acute impacted midcervical left femur fracture. 2. Postop changes across the right femoral neck without acute abnormality. Electronically Signed   By: Lucrezia Europe M.D.   On: 09/17/2015 12:46    ROS   ROS: I have reviewed the patient's review of systems thoroughly and there are no positive responses as relates to the HPI. EXAM: Blood pressure 120/71, pulse 85, resp. rate 20, weight 88.451 kg (195 lb), SpO2 95 %. Physical Exam Well-developed well-nourished patient in no acute distress. Alert and oriented x3 HEENT:within normal limits Cardiac: Regular rate and rhythm Pulmonary: Lungs clear to auscultation Abdomen: Soft and nontender.  Normal active bowel sounds  Musculoskeletal: Right hip: Painful range of motion. Limited range of motion. Slight external rotation and shortening.      X-ray: Basicervical femoral neck fracture. Slight possibility of straight femoral neck fracture. Assessment/Plan: 82 year old male with multiple medical problems who fell and suffered a left basicervical hip fracture. The patient will be transferred down from [facility] for evaluation and treatment. He'll be admitted and cleared the the internal medicine service and taken to the operating room once he is cleared. He will need  intramedullary rod fixation with placement of the screw up into the femoral head. There is a slight possibility that if this is a true femoral neck fracture that we could give consideration to hemiarthroplasty. I will put him on the a table and will be able to do either once we get a better sense of what he looks like under fluoroscopy.The patient understands the risks and benefits of surgery. He understands the risk of bleeding, infection, need for further surgery. Heand his family understand a slight wrist the patient could die at or around the time of surgery. He understands these risks and wishes to proceed.  Makinzy Cleere L 09/17/2015, 3:55 PM

## 2015-09-17 NOTE — Anesthesia Postprocedure Evaluation (Signed)
Anesthesia Post Note  Patient: David Collier  Procedure(s) Performed: Procedure(s) (LRB): INTRAMEDULLARY (IM) NAIL FEMORAL (Left)  Patient location during evaluation: PACU Anesthesia Type: General Level of consciousness: sedated and confused (mental status unchanged from pre-op) Pain management: pain level controlled Vital Signs Assessment: post-procedure vital signs reviewed and stable Respiratory status: spontaneous breathing, nonlabored ventilation, respiratory function stable and patient connected to nasal cannula oxygen Cardiovascular status: blood pressure returned to baseline and stable Postop Assessment: no signs of nausea or vomiting Anesthetic complications: no    Last Vitals:  Filed Vitals:   09/17/15 2330 09/17/15 2345  BP:  122/43  Pulse: 91 91  Temp:    Resp: 22 14    Last Pain: There were no vitals filed for this visit.               Midge Minium

## 2015-09-17 NOTE — ED Provider Notes (Signed)
CSN: [id number]     Arrival date & time 09/17/15  1120 History   First MD Initiated Contact with Patient 09/17/15 1207     Chief Complaint  Patient presents with  . Hip Pain      Patient is a 82 y.o. male presenting with hip pain. The history is provided by the EMS personnel and the nursing home. The history is limited by the condition of the patient (Hx dementia).  Hip Pain  Pt was seen at 1210. Per EMS, NH report and family: Pt fell out of bed yesterday. XR right hip completed yesterday was negative for acute fracture. Pt has been c/o left hip pain when turned to the left side and when his left hip is touched. Pt was sent to the ED for further evaluation. Pt has significant hx of dementia.   Past Medical History  Diagnosis Date  . Hyperlipidemia   . Coronary atherosclerosis of native coronary artery     BMS circumflex and OM 2006, PTCA ISR 2010, moderate LAD disease  . DJD (degenerative joint disease)   . Macular degeneration   . Alzheimer's dementia   . CKD (chronic kidney disease), stage III   . History of stroke     Seen radiographically-left PICA; unknown to patient.  Marland Kitchen TIA (transient ischemic attack) 03/05/2014  . 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 CAD (coronary artery disease), native coronary artery     Initially diagnosed 2003 2006 catheterization with bare metal stenting of the first marginal branch of the circumflex and continuation branch of the circumflex 2010 catheterization showing normal left main, 50-60% LAD unchanged from before, proximal stent of marginal patent, severe in-stent restenosis treated with cutting balloon angioplasty, patent stent in continuation branch with mild in-stent restenosis, occluded nondominant right coronary artery.   . Myocardial infarction ([place])     x5    1980s  sees DR. MICHAEL COOPER  . Anginal pain (HCC)     NONE IN 1 MONTH   . Hypertension   . Stroke ([place])   . Depression   . Headache   . Seizures [facility])     Past Surgical History  Procedure Laterality Date  . Appendectomy    . Cardiac catheterization      3 STENTS  AT  [place]  80S OR 90S  . Coronary angioplasty    . Cholecystectomy    . Multiple extractions with alveoloplasty N/A 10/22/2014    Procedure: MULTIPLE EXTRACTIONS  # 4,6, 7, 8, 10, 21, 22, 23, 24, 25, 26, 27, 28 and ALVEOLOPLASTY;  Surgeon: Diona Browner, DDS;  Location: [facility];  Service: Oral Surgery;  Laterality: N/A;  . Intramedullary (im) nail intertrochanteric Right 06/25/2015    Procedure: INTRAMEDULLARY (IM) NAIL INTERTROCHANTRIC;  Surgeon: Dorna Leitz, MD;  Location: [facility];  Service: Orthopedics;  Laterality: Right;   Family History  Problem Relation Age of Onset  . Heart failure     Social History  Substance Use Topics  . Smoking status: Former Smoker    Types: Cigarettes    Quit date: 04/23/1966  . Smokeless tobacco: Never Used  . Alcohol Use: No    Review of Systems  Unable to perform ROS: Dementia      Allergies  Aricept; Ativan; Atorvastatin; Namenda; Other; Simvastatin; and Sorbitan  Home Medications   Prior to Admission medications   Medication Sig Start Date End Date Taking? Authorizing Provider  aspirin EC 81 MG tablet Take 2 tablets (162 mg total) by mouth daily. 03/06/14  Yes Rexene Alberts, MD  cyanocobalamin 500 MCG tablet Take 500 mcg by mouth daily.   Yes Historical Provider, MD  divalproex (DEPAKOTE ER) 500 MG 24 hr tablet Take 750 mg by mouth at bedtime.  06/11/15  Yes Historical Provider, MD  ferrous sulfate 325 (65 FE) MG tablet Take 325 mg by mouth daily with breakfast.   Yes Historical Provider, MD  fluticasone (VERAMYST) 27.5 MCG/SPRAY nasal spray Place 1 spray into the nose 2 (two) times daily.   Yes Historical Provider, MD  loratadine (CLARITIN) 10 MG tablet Take 10 mg by mouth daily.   Yes Historical Provider, MD  LORazepam (ATIVAN) 0.5 MG tablet Take 1 tablet (0.5 mg total) by mouth daily as needed for anxiety. 06/27/15  Yes Thurnell Lose, MD  metoprolol (LOPRESSOR) 25 MG tablet Take 1 tablet (25 mg total) by mouth daily. Patient taking differently: Take 12.5 mg by mouth 2 (two) times daily.  07/12/14  Yes Rhonda G Barrett, PA-C  NITROSTAT 0.4 MG SL tablet DISSOLVE ONE TABLET UNDER THE TONGUE EVERY 5 MINUTES AS NEEDED FOR CHEST PAIN 04/12/14  Yes [name], MD  pantoprazole (PROTONIX) 20 MG tablet Take 1 tablet (20 mg total) by mouth daily. 09/06/15  Yes Daleen Bo, MD  QUEtiapine (SEROQUEL) 25 MG tablet Take 25 mg by mouth at bedtime.  06/10/15  Yes Historical Provider, MD  Red Yeast Rice 600 MG CAPS Take 1 capsule by mouth every morning.    Yes Historical Provider, MD  sertraline (ZOLOFT) 100 MG tablet Take 100 mg by mouth daily.   Yes Historical Provider, MD  Skin Protectants, Misc. (EUCERIN) cream Apply 1 application topically 2 (two) times daily.   Yes Historical Provider, MD  clopidogrel (PLAVIX) 75 MG tablet Take 1 tablet (75 mg total) by mouth daily. Patient not taking: Reported on 09/06/2015 06/27/15   Thurnell Lose, MD  docusate sodium (COLACE) 100 MG capsule Take 1 capsule (100 mg total) by mouth 2 (two) times daily. Patient not taking: Reported on 09/06/2015 06/27/15   Thurnell Lose, MD  HYDROcodone-acetaminophen (NORCO) 5-325 MG tablet Take 1 tablet by mouth every 6 (six) hours as needed for moderate pain or severe pain. Patient not taking: Reported on 09/06/2015 06/25/15   Gary Fleet, PA-C   BP 104/66 mmHg  Pulse 96  Resp 20  Wt 195 lb (88.451 kg)  SpO2 93% Physical Exam  Physical examination:  Nursing notes reviewed; Vital signs and O2 SAT reviewed;  Constitutional: Well developed, Well nourished, Well hydrated, In no acute distress; Head:  Normocephalic, atraumatic; Eyes: EOMI, PERRL, No scleral icterus; ENMT: Mouth and pharynx normal, Mucous membranes moist; Neck: Supple, Full range of motion, No lymphadenopathy; Cardiovascular: Regular rate and rhythm, No gallop; Respiratory: Breath sounds clear &  equal bilaterally, No wheezes.  Speaking full sentences with ease, Normal respiratory effort/excursion; Chest: Nontender, Movement normal; Abdomen: Soft, Nontender, Nondistended, Normal bowel sounds; Genitourinary: No CVA tenderness; Extremities: Pulses normal, Pelvis stable. +left hip tenderness to palp. NMS intact distally LLE. No edema, No calf edema or asymmetry.; Neuro: Awake, alert, confused per hx dementia. No facial droop. +decreased ROM left hip, otherwise moves all extremities on stretcher spontaneously..; Skin: Color normal, Warm, Dry.    ED Course  Procedures (including critical care time) Labs Review   Imaging Review  I have personally reviewed and evaluated these images and lab results as part of my medical decision-making.   EKG Interpretation   Date/Time:  Saturday Sep 17 2015 14:30:17 EDT  Ventricular Rate:  84 PR Interval:  211 QRS Duration: 132 QT Interval:  388 QTC Calculation: 459 R Axis:   25 Text Interpretation:  Sinus rhythm Ventricular premature complex Right  bundle branch block Baseline wander When compared with ECG of 09/06/2015 No  significant change was found Confirmed by [facility]  MD, Nunzio Cory [id number]) on  09/17/2015 2:39:16 PM      MDM  MDM Reviewed: previous chart, nursing note and vitals Reviewed previous: labs and ECG Interpretation: labs, ECG and x-ray     Results for orders placed or performed during the hospital encounter of [id number]  Basic metabolic panel  Result Value Ref Range   Sodium 139 135 - 145 mmol/L   Potassium 4.2 3.5 - 5.1 mmol/L   Chloride 108 101 - 111 mmol/L   CO2 26 22 - 32 mmol/L   Glucose, Bld 134 (H) 65 - 99 mg/dL   BUN 32 (H) 6 - 20 mg/dL   Creatinine, Ser 1.32 (H) 0.61 - 1.24 mg/dL   Calcium 8.6 (L) 8.9 - 10.3 mg/dL   GFR calc non Af Amer 48 (L) >60 mL/min   GFR calc Af Amer 55 (L) >60 mL/min   Anion gap 5 5 - 15  Troponin I  Result Value Ref Range   Troponin I <0.03 <0.031 ng/mL  CBC with Differential   Result Value Ref Range   WBC 14.4 (H) 4.0 - 10.5 K/uL   RBC 3.21 (L) 4.22 - 5.81 MIL/uL   Hemoglobin 9.4 (L) 13.0 - 17.0 g/dL   HCT 30.4 (L) 39.0 - 52.0 %   MCV 94.7 78.0 - 100.0 fL   MCH 29.3 26.0 - 34.0 pg   MCHC 30.9 30.0 - 36.0 g/dL   RDW 14.5 11.5 - 15.5 %   Platelets 191 150 - 400 K/uL   Neutrophils Relative % 84 %   Neutro Abs 12.2 (H) 1.7 - 7.7 K/uL   Lymphocytes Relative 6 %   Lymphs Abs 0.9 0.7 - 4.0 K/uL   Monocytes Relative 9 %   Monocytes Absolute 1.2 (H) 0.1 - 1.0 K/uL   Eosinophils Relative 1 %   Eosinophils Absolute 0.1 0.0 - 0.7 K/uL   Basophils Relative 0 %   Basophils Absolute 0.0 0.0 - 0.1 K/uL  Protime-INR  Result Value Ref Range   Prothrombin Time 16.5 (H) 11.6 - 15.2 seconds   INR 1.32 0.00 - 1.49   Dg Chest Port 1 View 09/17/2015  CLINICAL DATA:  Status post fall, left hip fracture EXAM: PORTABLE CHEST 1 VIEW COMPARISON:  09/06/2015 FINDINGS: The heart size and mediastinal contours are within normal limits. Both lungs are clear. Possible right eighth lateral rib fracture. Mild osteoarthritis of bilateral glenohumeral joints. IMPRESSION: No active disease. Possible right eighth lateral rib fracture. Electronically Signed   By: Kathreen Devoid   On: 09/17/2015 13:22   Dg Hips Bilat With Pelvis Min 5 Views 09/17/2015  CLINICAL DATA:  FALL, BILATERAL HIP PAIN, Pt brought in by EMS from [facility]. Pt fell from bed.on Friday 09/16/15,Nursing home note reports pt complaining of pain while being turned to left side, BEST IMAGES OBTAINED DUE TO PATIENTS CONDITION, PATIENT HELD FOR SEVERAL IMAGES. HISTORY OF HTN, STROKE, CAD, TIA, ALZHEIMER'S, DEMENTIA, CKD, INTRAMEDULLARY (IM) NAIL INTERTROCHANTERIC OF RIGHT ON 06/25/2015 EXAM: DG HIP (WITH OR WITHOUT PELVIS) 5+V BILAT COMPARISON:  06/25/2015 and previous FINDINGS: Sliding screw and IM rod transfix right intertrochanteric fracture in near anatomic alignment. The distal into the IM rod is  not included. There is impacted  midcervical fracture of the left femur. No dislocation. Bony pelvis intact. Bilateral pelvic phleboliths. IMPRESSION: 1. Acute impacted midcervical left femur fracture. 2. Postop changes across the right femoral neck without acute abnormality. Electronically Signed   By: Lucrezia Europe M.D.   On: 09/17/2015 12:46    1500:  Pt appears comfortable on stretcher when staff not touching him. Will start IVF, keep NPO.  Dx and testing d/w pt and family.  Questions answered.  Verb understanding, agreeable to transfer to [facility] for admit. T/C back from [facility] Ortho Dr. Berenice Primas, case discussed, including:  HPI, pertinent PM/SHx, VS/PE, dx testing, ED course and treatment:  Agreeable to consult, requests to admit to [facility] service. T/C to [facility] Dr. Hal Hope, case discussed, including:  HPI, pertinent PM/SHx, VS/PE, dx testing, ED course and treatment:  Agreeable to facilitate transfer to [facility], requests to write temporary orders, obtain medical bed to [facility]Dr. Tana Coast.   Francine Graven, DO 09/19/15 2015

## 2015-09-17 NOTE — Anesthesia Procedure Notes (Signed)
Procedure Name: Intubation Date/Time: 09/17/2015 10:08 PM Performed by: Maude Leriche D Pre-anesthesia Checklist: Patient identified, Emergency Drugs available, Patient being monitored, Suction available and Timeout performed Patient Re-evaluated:Patient Re-evaluated prior to inductionOxygen Delivery Method: Circle system utilized Preoxygenation: Pre-oxygenation with 100% oxygen Intubation Type: IV induction, Rapid sequence and Cricoid Pressure applied Laryngoscope Size: Miller and 2 Grade View: Grade I Tube type: Oral Tube size: 8.0 mm Number of attempts: 1 Airway Equipment and Method: Stylet Placement Confirmation: ETT inserted through vocal cords under direct vision,  positive ETCO2 and breath sounds checked- equal and bilateral Secured at: 22 cm Tube secured with: Tape Dental Injury: Teeth and Oropharynx as per pre-operative assessment

## 2015-09-18 DIAGNOSIS — I10 Essential (primary) hypertension: Secondary | ICD-10-CM

## 2015-09-18 DIAGNOSIS — S72002A Fracture of unspecified part of neck of left femur, initial encounter for closed fracture: Secondary | ICD-10-CM

## 2015-09-18 DIAGNOSIS — I25811 Atherosclerosis of native coronary artery of transplanted heart without angina pectoris: Secondary | ICD-10-CM

## 2015-09-18 DIAGNOSIS — D62 Acute posthemorrhagic anemia: Secondary | ICD-10-CM

## 2015-09-18 LAB — URINALYSIS, ROUTINE W REFLEX MICROSCOPIC
Bilirubin Urine: NEGATIVE
GLUCOSE, UA: NEGATIVE mg/dL
Ketones, ur: 15 mg/dL — AB
Nitrite: NEGATIVE
PH: 6 (ref 5.0–8.0)
Protein, ur: NEGATIVE mg/dL
SPECIFIC GRAVITY, URINE: 1.02 (ref 1.005–1.030)

## 2015-09-18 LAB — URINE MICROSCOPIC-ADD ON

## 2015-09-18 LAB — BASIC METABOLIC PANEL
ANION GAP: 5 (ref 5–15)
ANION GAP: 7 (ref 5–15)
BUN: 26 mg/dL — ABNORMAL HIGH (ref 6–20)
BUN: 27 mg/dL — ABNORMAL HIGH (ref 6–20)
CALCIUM: 8.3 mg/dL — AB (ref 8.9–10.3)
CALCIUM: 8.6 mg/dL — AB (ref 8.9–10.3)
CHLORIDE: 109 mmol/L (ref 101–111)
CO2: 26 mmol/L (ref 22–32)
CO2: 26 mmol/L (ref 22–32)
Chloride: 107 mmol/L (ref 101–111)
Creatinine, Ser: 1.31 mg/dL — ABNORMAL HIGH (ref 0.61–1.24)
Creatinine, Ser: 1.35 mg/dL — ABNORMAL HIGH (ref 0.61–1.24)
GFR calc Af Amer: 56 mL/min — ABNORMAL LOW (ref >60)
GFR calc non Af Amer: 47 mL/min — ABNORMAL LOW (ref >60)
GFR, EST AFRICAN AMERICAN: 54 mL/min — AB (ref >60)
GFR, EST NON AFRICAN AMERICAN: 48 mL/min — AB (ref >60)
GLUCOSE: 128 mg/dL — AB (ref 65–99)
GLUCOSE: 130 mg/dL — AB (ref 65–99)
POTASSIUM: 4.1 mmol/L (ref 3.5–5.1)
Potassium: 4.4 mmol/L (ref 3.5–5.1)
SODIUM: 140 mmol/L (ref 135–145)
Sodium: 140 mmol/L (ref 135–145)

## 2015-09-18 LAB — MRSA PCR SCREENING: MRSA BY PCR: NEGATIVE

## 2015-09-18 LAB — CBC
HEMATOCRIT: 29.7 % — AB (ref 39.0–52.0)
HEMOGLOBIN: 8.8 g/dL — AB (ref 13.0–17.0)
MCH: 28.4 pg (ref 26.0–34.0)
MCHC: 29.6 g/dL — ABNORMAL LOW (ref 30.0–36.0)
MCV: 95.8 fL (ref 78.0–100.0)
PLATELETS: 179 10*3/uL (ref 150–400)
RBC: 3.1 MIL/uL — AB (ref 4.22–5.81)
RDW: 14.6 % (ref 11.5–15.5)
WBC: 14.9 10*3/uL — AB (ref 4.0–10.5)

## 2015-09-18 LAB — VALPROIC ACID LEVEL: Valproic Acid Lvl: <10 ug/mL — ABNORMAL LOW (ref 50.0–100.0)

## 2015-09-18 LAB — GLUCOSE, CAPILLARY
GLUCOSE-CAPILLARY: 111 mg/dL — AB (ref 65–99)
Glucose-Capillary: 115 mg/dL — ABNORMAL HIGH (ref 65–99)

## 2015-09-18 LAB — ABO/RH: ABO/RH(D): A POS

## 2015-09-18 MED ORDER — ZOLPIDEM TARTRATE 5 MG PO TABS: 5.0000 mg | ORAL_TABLET | Freq: Every evening | ORAL | Status: DC | PRN

## 2015-09-18 MED ORDER — DIVALPROEX SODIUM ER 500 MG PO TB24
750.0000 mg | ORAL_TABLET | Freq: Every day | ORAL | Status: DC
  Administered 2015-09-18 – 2015-09-19 (×3): 750 mg via ORAL
  Filled 2015-09-18 (×4): qty 1

## 2015-09-18 MED ORDER — FLUTICASONE PROPIONATE 50 MCG/ACT NA SUSP
1.0000 | Freq: Two times a day (BID) | NASAL | Status: DC
  Administered 2015-09-18 – 2015-09-20 (×4): 1 via NASAL
  Filled 2015-09-18: qty 16

## 2015-09-18 MED ORDER — POLYETHYLENE GLYCOL 3350 17 G PO PACK: 17.0000 g | PACK | Freq: Every day | ORAL | Status: DC | PRN

## 2015-09-18 MED ORDER — ASPIRIN EC 325 MG PO TBEC
325.0000 mg | DELAYED_RELEASE_TABLET | Freq: Every day | ORAL | Status: DC
  Administered 2015-09-18 – 2015-09-20 (×3): 325 mg via ORAL
  Filled 2015-09-18 (×3): qty 1

## 2015-09-18 MED ORDER — ONDANSETRON HCL 4 MG/2ML IJ SOLN: 4.0000 mg | Freq: Four times a day (QID) | INTRAMUSCULAR | Status: DC | PRN

## 2015-09-18 MED ORDER — ACETAMINOPHEN 650 MG RE SUPP: 650.0000 mg | Freq: Four times a day (QID) | RECTAL | Status: DC | PRN

## 2015-09-18 MED ORDER — LORATADINE 10 MG PO TABS
10.0000 mg | ORAL_TABLET | Freq: Every day | ORAL | Status: DC
  Administered 2015-09-18 – 2015-09-20 (×3): 10 mg via ORAL
  Filled 2015-09-18 (×3): qty 1

## 2015-09-18 MED ORDER — PANTOPRAZOLE SODIUM 20 MG PO TBEC
20.0000 mg | DELAYED_RELEASE_TABLET | Freq: Every day | ORAL | Status: DC
  Administered 2015-09-18 – 2015-09-20 (×3): 20 mg via ORAL
  Filled 2015-09-18 (×3): qty 1

## 2015-09-18 MED ORDER — HYDROCODONE-ACETAMINOPHEN 5-325 MG PO TABS: 1.0000 | ORAL_TABLET | Freq: Four times a day (QID) | ORAL | Status: DC | PRN

## 2015-09-18 MED ORDER — BISACODYL 5 MG PO TBEC
5.0000 mg | DELAYED_RELEASE_TABLET | Freq: Every day | ORAL | Status: DC | PRN
  Administered 2015-09-20: 5 mg via ORAL
  Filled 2015-09-18: qty 1

## 2015-09-18 MED ORDER — MORPHINE SULFATE (PF) 2 MG/ML IV SOLN
0.5000 mg | INTRAVENOUS | Status: DC | PRN
  Administered 2015-09-18: 0.5 mg via INTRAVENOUS
  Filled 2015-09-18: qty 1

## 2015-09-18 MED ORDER — ACETAMINOPHEN 325 MG PO TABS: 650.0000 mg | ORAL_TABLET | Freq: Four times a day (QID) | ORAL | Status: DC | PRN

## 2015-09-18 MED ORDER — INSULIN ASPART 100 UNIT/ML ~~LOC~~ SOLN
0.0000 [IU] | Freq: Three times a day (TID) | SUBCUTANEOUS | Status: DC
Start: 2015-09-18 — End: 2015-09-20
  Administered 2015-09-19: 2 [IU] via SUBCUTANEOUS

## 2015-09-18 MED ORDER — SERTRALINE HCL 100 MG PO TABS
100.0000 mg | ORAL_TABLET | Freq: Every day | ORAL | Status: DC
  Administered 2015-09-18 – 2015-09-20 (×3): 100 mg via ORAL
  Filled 2015-09-18 (×3): qty 1

## 2015-09-18 MED ORDER — QUETIAPINE FUMARATE 25 MG PO TABS
25.0000 mg | ORAL_TABLET | Freq: Every day | ORAL | Status: DC
  Administered 2015-09-18 – 2015-09-19 (×3): 25 mg via ORAL
  Filled 2015-09-18 (×4): qty 1

## 2015-09-18 MED ORDER — NITROGLYCERIN 0.4 MG SL SUBL: 0.4000 mg | SUBLINGUAL_TABLET | SUBLINGUAL | Status: DC | PRN

## 2015-09-18 MED ORDER — DOCUSATE SODIUM 100 MG PO CAPS
100.0000 mg | ORAL_CAPSULE | Freq: Two times a day (BID) | ORAL | Status: DC
  Administered 2015-09-18 – 2015-09-20 (×5): 100 mg via ORAL
  Filled 2015-09-18 (×4): qty 1

## 2015-09-18 MED ORDER — ALUM & MAG HYDROXIDE-SIMETH 200-200-20 MG/5ML PO SUSP: 30.0000 mL | ORAL | Status: DC | PRN

## 2015-09-18 MED ORDER — CEFAZOLIN SODIUM-DEXTROSE 2-4 GM/100ML-% IV SOLN
2.0000 g | Freq: Four times a day (QID) | INTRAVENOUS | Status: AC
  Administered 2015-09-18 (×2): 2 g via INTRAVENOUS
  Filled 2015-09-18 (×2): qty 100

## 2015-09-18 MED ORDER — MAGNESIUM CITRATE PO SOLN: 1.0000 | Freq: Once | ORAL | Status: DC | PRN

## 2015-09-18 MED ORDER — LORAZEPAM 0.5 MG PO TABS: 0.5000 mg | ORAL_TABLET | Freq: Every day | ORAL | Status: DC | PRN

## 2015-09-18 MED ORDER — HYDROCODONE-ACETAMINOPHEN 5-325 MG PO TABS
1.0000 | ORAL_TABLET | Freq: Four times a day (QID) | ORAL | Status: DC | PRN
  Administered 2015-09-18 – 2015-09-19 (×3): 2 via ORAL
  Administered 2015-09-20 (×2): 1 via ORAL
  Filled 2015-09-18 (×2): qty 2
  Filled 2015-09-18: qty 1
  Filled 2015-09-18: qty 2
  Filled 2015-09-18: qty 1

## 2015-09-18 MED ORDER — METOPROLOL TARTRATE 12.5 MG HALF TABLET
12.5000 mg | ORAL_TABLET | Freq: Two times a day (BID) | ORAL | Status: DC
  Administered 2015-09-18 – 2015-09-20 (×4): 12.5 mg via ORAL
  Filled 2015-09-18 (×4): qty 1

## 2015-09-18 MED ORDER — ONDANSETRON HCL 4 MG PO TABS: 4.0000 mg | ORAL_TABLET | Freq: Four times a day (QID) | ORAL | Status: DC | PRN

## 2015-09-18 MED ORDER — FERROUS SULFATE 325 (65 FE) MG PO TABS
325.0000 mg | ORAL_TABLET | Freq: Every day | ORAL | Status: DC
  Administered 2015-09-18 – 2015-09-20 (×3): 325 mg via ORAL
  Filled 2015-09-18 (×3): qty 1

## 2015-09-18 MED ORDER — VITAMIN B-12 1000 MCG PO TABS
500.0000 ug | ORAL_TABLET | Freq: Every day | ORAL | Status: DC
  Administered 2015-09-18 – 2015-09-20 (×3): 500 ug via ORAL
  Filled 2015-09-18 (×3): qty 1

## 2015-09-18 NOTE — Evaluation (Signed)
Physical Therapy Evaluation Patient Details Name: David Collier MRN: [id number] DOB: 02/24/1929 Today's Date: 09/18/2015   History of Present Illness  David Collier is a 82 y.o. male with PMH significant of advanced dementia, CAD status post stenting, CKD III, chronic anemia, TIA was brought to the ER patient had persistent pain in his left hip. As per the daughter patient had a fall yesterday after trying to get out of the bed. Xray showed left hip fx, underwent IM nail. Of note: pt presented with same dx on right in 3/17  Clinical Impression  Pt admitted with above diagnosis. Pt currently with functional limitations due to the deficits listed below (see PT Problem List). Pt with advanced dementia and unable to comprehend what happened to cause his pain or why he needs to move so resistant throughout therapy session. +2 total A for bed mobility and maintaining sitting EOB.  Pt will benefit from skilled PT to increase their independence and safety with mobility to allow discharge to the venue listed below.      Follow Up Recommendations SNF;Supervision/Assistance - 24 hour    Equipment Recommendations  None recommended by PT    Recommendations for Other Services       Precautions / Restrictions Precautions Precautions: Fall Precaution Comments: has had multiple falls, do not know how long he stayed on the floor this time Restrictions Weight Bearing Restrictions: Yes LLE Weight Bearing: Touchdown weight bearing      Mobility  Bed Mobility Overal bed mobility: Needs Assistance;+2 for physical assistance Bed Mobility: Supine to Sit;Sit to Supine     Supine to sit: Total assist;+2 for physical assistance Sit to supine: Total assist;+2 for physical assistance   General bed mobility comments: pt resistant to all movement, +2 tot A for getting to EOB and pt trying to lie back down the whole time he was sitting  Transfers                 General transfer comment:  unable  Ambulation/Gait             General Gait Details: unable  Stairs            Wheelchair Mobility    Modified Rankin (Stroke Patients Only)       Balance Overall balance assessment: Needs assistance;History of Falls Sitting-balance support: Bilateral upper extremity supported Sitting balance-Leahy Scale: Zero Sitting balance - Comments: pt required tot A +2 to maintain sitting EOB due to posterior right lean away from injured hip and desire to lie back down Postural control: Posterior lean;Right lateral lean     Standing balance comment: unable to stand                             Pertinent Vitals/Pain Pain Assessment: Faces Faces Pain Scale: Hurts worst Pain Location: left hip Pain Descriptors / Indicators: Grimacing;Guarding;Crying Pain Intervention(s): Limited activity within patient's tolerance;Monitored during session;Repositioned    Home Living Family/patient expects to be discharged to:: Skilled nursing facility                      Prior Function Level of Independence: Needs assistance   Gait / Transfers Assistance Needed: sister reports that he had returned to getting in w/c since right hip fx but she does not think he has been ambulatory           Hand Dominance  Extremity/Trunk Assessment   Upper Extremity Assessment: Generalized weakness           Lower Extremity Assessment: Generalized weakness;LLE deficits/detail   LLE Deficits / Details: pt cries out with all movement of or touching of LLE  Cervical / Trunk Assessment: Kyphotic  Communication   Communication: HOH  Cognition Arousal/Alertness: Awake/alert Behavior During Therapy: Anxious Overall Cognitive Status: History of cognitive impairments - at baseline (pt calling for "mama")       Memory: Decreased short-term memory              General Comments      Exercises        Assessment/Plan    PT Assessment Patient needs  continued PT services  PT Diagnosis Generalized weakness;Acute pain;Altered mental status   PT Problem List Decreased strength;Decreased range of motion;Decreased activity tolerance;Decreased balance;Decreased mobility;Decreased cognition;Decreased safety awareness;Decreased knowledge of precautions;Pain  PT Treatment Interventions Functional mobility training;Therapeutic activities;Therapeutic exercise;DME instruction;Balance training;Patient/family education   PT Goals (Current goals can be found in the Care Plan section) Acute Rehab PT Goals Patient Stated Goal: "not do that again" PT Goal Formulation: With family Time For Goal Achievement: 10/02/15 Potential to Achieve Goals: Fair    Frequency Min 2X/week   Barriers to discharge        Co-evaluation               End of Session   Activity Tolerance: Patient limited by pain Patient left: in bed;with call bell/phone within reach;with family/visitor present Nurse Communication: Mobility status         Time: [id number] PT Time Calculation (min) (ACUTE ONLY): 26 min   Charges:   PT Evaluation $PT Eval Moderate Complexity: 1 Procedure PT Treatments $Therapeutic Activity: 8-22 mins   PT G Codes:      Leighton Roach, PT  Acute Rehab Services  [place], [place] 09/18/2015, 1:50 PM

## 2015-09-18 NOTE — Progress Notes (Signed)
OT Cancellation Note  Patient Details Name: David Collier MRN: [id number] DOB: 05/20/1928   Cancelled Treatment:    Reason Eval/Treat Not Completed:  (OT screened) Pt's current D/C plan is SNF. No apparent immediate acute care OT needs, therefore will defer OT to SNF. If OT eval is needed please call Acute Rehab Dept. at [phone] or text page OT at [phone].    Benito Mccreedy OTR/L [id number] 09/18/2015, 3:42 PM

## 2015-09-18 NOTE — Progress Notes (Signed)
PROGRESS NOTE    David Collier  [id number] DOB: 06/02/1928 DOA: 09/17/2015 PCP: Renata Caprice, DO   Brief Narrative:  82 y/o with dementia that presented to the hospital after a fall and diagnosed with Left closed hip fracture.    Assessment & Plan:   Principal Problem:   Closed left hip fracture ([place]) -  1 Day Post-Op Procedure(s) (LRB): INTRAMEDULLARY (IM) NAIL FEMORAL (Left) - Ortho on board and managing - PT on board - Continue supportive therapy  Active Problems:   Essential hypertension -Stable on beta blocker    CAD (coronary artery disease), native coronary artery - Continue aspirin    CKD (chronic kidney disease), stage III - Stable    Dementia - Stable continue current regimen    Personal history of transient ischemic attack (TIA) and cerebral infarction without residual deficit   Acute blood loss anemia - Blood levels steady   DVT prophylaxis: Deferred to orthopedic surgery given principal problem Code Status: DO NOT RESUSCITATE Family Communication: Discussed with daughter at bedside Disposition Plan: Pending physical therapy evaluation and once patient is cleared for discharge by Ortho   Consultants:   Orthopedic surgery   Procedures: None   Antimicrobials: None   Subjective: Pt has no new complaints. No acute issues overnight.  Objective: Filed Vitals:   09/18/15 0000 09/18/15 0020 09/18/15 0405 09/18/15 1029  BP: 125/76 138/90 134/73 117/61  Pulse: 94 99 94 98  Temp: 98 F (36.7 C) 99.1 F (37.3 C) 98.3 F (36.8 C) 98.1 F (36.7 C)  TempSrc:  Axillary Axillary Oral  Resp: 22 20 18 18   Weight:      SpO2: 98% 98% 100% 94%    Intake/Output Summary (Last 24 hours) at 09/18/15 1512 Last data filed at 09/18/15 0909  Gross per 24 hour  Intake    900 ml  Output    580 ml  Net    320 ml   Filed Weights   09/17/15 1127  Weight: 88.451 kg (195 lb)    Examination:  General exam: awake and alert, in nad Respiratory  system: Clear to auscultation. Respiratory effort normal. Cardiovascular system: S1 & S2 heard,No JVD, murmurs, rubs, gallops or clicks. Gastrointestinal system: Abdomen is nondistended, soft and nontender. No organomegaly or masses felt. Normal bowel sounds heard. Central nervous system: Alert. Equal tone Extremities: Symmetric 5 x 5 power. Skin: No rashes, lesions or ulcers Psychiatry: unable to accurately assess due to dementia  Data Reviewed: I have personally reviewed following labs and imaging studies  CBC:  Recent Labs Lab 09/17/15 1308 09/18/15 0212  WBC 14.4* 14.9*  NEUTROABS 12.2*  --   HGB 9.4* 8.8*  HCT 30.4* 29.7*  MCV 94.7 95.8  PLT 191 0000000   Basic Metabolic Panel:  Recent Labs Lab 09/17/15 1308 09/18/15 09/18/15 0212  NA 139 140 140  K 4.2 4.1 4.4  CL 108 109 107  CO2 26 26 26   GLUCOSE 134* 130* 128*  BUN 32* 27* 26*  CREATININE 1.32* 1.35* 1.31*  CALCIUM 8.6* 8.3* 8.6*   GFR: Estimated Creatinine Clearance: 46.2 mL/min (by C-G formula based on Cr of 1.31). Liver Function Tests: No results for input(s): AST, ALT, ALKPHOS, BILITOT, PROT, ALBUMIN in the last 168 hours. No results for input(s): LIPASE, AMYLASE in the last 168 hours. No results for input(s): AMMONIA in the last 168 hours. Coagulation Profile:  Recent Labs Lab 09/17/15 1308  INR 1.32   Cardiac Enzymes:  Recent Labs Lab 09/17/15 1308  TROPONINI <0.03   BNP (last 3 results) No results for input(s): PROBNP in the last 8760 hours. HbA1C: No results for input(s): HGBA1C in the last 72 hours. CBG: No results for input(s): GLUCAP in the last 168 hours. Lipid Profile: No results for input(s): CHOL, HDL, LDLCALC, TRIG, CHOLHDL, LDLDIRECT in the last 72 hours. Thyroid Function Tests: No results for input(s): TSH, T4TOTAL, FREET4, T3FREE, THYROIDAB in the last 72 hours. Anemia Panel: No results for input(s): VITAMINB12, FOLATE, FERRITIN, TIBC, IRON, RETICCTPCT in the last 72  hours. Sepsis Labs: No results for input(s): PROCALCITON, LATICACIDVEN in the last 168 hours.  Recent Results (from the past 240 hour(s))  MRSA PCR Screening     Status: None   Collection Time: 09/18/15  8:49 AM  Result Value Ref Range Status   MRSA by PCR NEGATIVE NEGATIVE Final    Comment:        The GeneXpert MRSA Assay (FDA approved for NASAL specimens only), is one component of a comprehensive MRSA colonization surveillance program. It is not intended to diagnose MRSA infection nor to guide or monitor treatment for MRSA infections.          Radiology Studies: Dg Chest Port 1 View  09/17/2015  CLINICAL DATA:  Status post fall, left hip fracture EXAM: PORTABLE CHEST 1 VIEW COMPARISON:  09/06/2015 FINDINGS: The heart size and mediastinal contours are within normal limits. Both lungs are clear. Possible right eighth lateral rib fracture. Mild osteoarthritis of bilateral glenohumeral joints. IMPRESSION: No active disease. Possible right eighth lateral rib fracture. Electronically Signed   By: Kathreen Devoid   On: 09/17/2015 13:22   Dg C-arm 61-120 Min  09/18/2015  CLINICAL DATA:  Left hip surgery. EXAM: DG C-ARM 61-120 MIN; LEFT FEMUR 2 VIEWS COMPARISON:  Radiographs earlier this day. FINDINGS: Four fluoroscopic spot images from the operating room during left femur ORIF demonstrate intra medullary rod with distal and proximal trans trochanteric screws traversing left proximal femur fracture. Fluoroscopy time not provided. IMPRESSION: Intraoperative fluoroscopic spot images post intra medullary rod and screw fixation of proximal left femur fracture. Electronically Signed   By: Jeb Levering M.D.   On: 09/18/2015 01:41   Dg Femur Min 2 Views Left  09/18/2015  CLINICAL DATA:  Left hip surgery. EXAM: DG C-ARM 61-120 MIN; LEFT FEMUR 2 VIEWS COMPARISON:  Radiographs earlier this day. FINDINGS: Four fluoroscopic spot images from the operating room during left femur ORIF demonstrate intra  medullary rod with distal and proximal trans trochanteric screws traversing left proximal femur fracture. Fluoroscopy time not provided. IMPRESSION: Intraoperative fluoroscopic spot images post intra medullary rod and screw fixation of proximal left femur fracture. Electronically Signed   By: Jeb Levering M.D.   On: 09/18/2015 01:41   Dg Hips Bilat With Pelvis Min 5 Views  09/17/2015  CLINICAL DATA:  FALL, BILATERAL HIP PAIN, Pt brought in by EMS [facility]. Pt fell from bed.on Friday 09/16/15,Nursing home note reports pt complaining of pain while being turned to left side, BEST IMAGES OBTAINED DUE TO PATIENTS CONDITION, PATIENT HELD FOR SEVERAL IMAGES. HISTORY OF HTN, STROKE, CAD, TIA, ALZHEIMER'S, DEMENTIA, CKD, INTRAMEDULLARY (IM) NAIL INTERTROCHANTERIC OF RIGHT ON 06/25/2015 EXAM: DG HIP (WITH OR WITHOUT PELVIS) 5+V BILAT COMPARISON:  06/25/2015 and previous FINDINGS: Sliding screw and IM rod transfix right intertrochanteric fracture in near anatomic alignment. The distal into the IM rod is not included. There is impacted midcervical fracture of the left femur. No dislocation. Bony pelvis intact. Bilateral pelvic phleboliths.  IMPRESSION: 1. Acute impacted midcervical left femur fracture. 2. Postop changes across the right femoral neck without acute abnormality. Electronically Signed   By: Lucrezia Europe M.D.   On: 09/17/2015 12:46        Scheduled Meds: . aspirin EC  325 mg Oral Q breakfast  . divalproex  750 mg Oral QHS  . docusate sodium  100 mg Oral BID  . ferrous sulfate  325 mg Oral Q breakfast  . fluticasone  1 spray Each Nare BID  . insulin aspart  0-9 Units Subcutaneous TID WC  . loratadine  10 mg Oral Daily  . metoprolol  12.5 mg Oral BID  . pantoprazole  20 mg Oral Daily  . QUEtiapine  25 mg Oral QHS  . sertraline  100 mg Oral Daily  . cyanocobalamin  500 mcg Oral Daily   Continuous Infusions: . sodium chloride 75 mL/hr at 09/17/15 2330  . dextrose 5 % and 0.45% NaCl 50  mL/hr at 09/17/15 2345     LOS: 1 day    Time spent: > 20 minutes    Velvet Bathe, MD [facility] Pager [phone]  If 7PM-7AM, please contact night-coverage [email or web address] Password [email or web address] 09/18/2015, 3:12 PM

## 2015-09-18 NOTE — Progress Notes (Signed)
Subjective: 1 Day Post-Op Procedure(s) (LRB): INTRAMEDULLARY (IM) NAIL FEMORAL (Left) Patient reports pain as mild.  The patient's family is at his bedside.  He is in his usual state of dementia.they report that since his right hip fracture he was not ambulating, mostly getting out of bed to the chair and sitting in a wheelchair.  Objective: Vital signs in last 24 hours: Temp:  [98 F (36.7 C)-99.1 F (37.3 C)] 98.1 F (36.7 C) (05/28 1029) Pulse Rate:  [55-112] 98 (05/28 1029) Resp:  [13-23] 18 (05/28 1029) BP: (109-151)/(43-105) 117/61 mmHg (05/28 1029) SpO2:  [81 %-100 %] 94 % (05/28 1029)  Intake/Output from previous day: 05/27 0701 - 05/28 0700 In: 900 [I.V.:900] Out: 365 [Urine:265; Blood:100] Intake/Output this shift: Total I/O In: 0  Out: 215 [Urine:215]   Recent Labs  09/17/15 1308 09/18/15 0212  HGB 9.4* 8.8*    Recent Labs  09/17/15 1308 09/18/15 0212  WBC 14.4* 14.9*  RBC 3.21* 3.10*  HCT 30.4* 29.7*  PLT 191 179    Recent Labs  09/18/15 09/18/15 0212  NA 140 140  K 4.1 4.4  CL 109 107  CO2 26 26  BUN 27* 26*  CREATININE 1.35* 1.31*  GLUCOSE 130* 128*  CALCIUM 8.3* 8.6*    Recent Labs  09/17/15 1308  INR 1.32   Left hip exam: Neurovascular intact Sensation intact distally Dorsiflexion/Plantar flexion intact Incision: dressing C/D/I Compartment soft  Assessment/Plan: 1 Day Post-Op Procedure(s) (LRB): INTRAMEDULLARY (IM) NAIL FEMORAL (Left)  Acute blood loss anemia.  Expected postop. Plan: Continue oral iron 1 daily. Aspirin 325 mg enteric-coated 1 daily along with SCDs for DVT prophylaxis. Touchdown weightbearing only on the left.  He will mostly only be able to do bed to chair transfers with significant help. Up with therapy Discharge to SNF  in a couple of days. I talked with the patient's family and explained the patient's situation  And they are accepting/agreeing with the process.  [place] G 09/18/2015, 2:34 PM

## 2015-09-18 NOTE — Op Note (Signed)
NAMEDRAVON, YOUNKIN              ACCOUNT NO.:  [id number]  MEDICAL RECORD NO.:  [id number]  LOCATION:  5N29C                        FACILITY:  [facility]  PHYSICIAN:  Alta Corning, M.D.   DATE OF BIRTH:  1928/09/29  DATE OF PROCEDURE:  09/17/2015 DATE OF DISCHARGE:                              OPERATIVE REPORT   PREOPERATIVE DIAGNOSIS:  Basicervical femoral neck fracture, left.  POSTOPERATIVE DIAGNOSIS:  Basicervical femoral neck fracture, left.  PROCEDURE: 1. Open reduction and internal fixation with intramedullary rod of     left basicervical femoral neck fracture. 2. Interpretation of multiple intraoperative fluoroscopic images.  SURGEON:  Alta Corning, MD  ASSISTANT:  Gary Fleet, PA  ANESTHESIA:  General.  BRIEF HISTORY:  Mr. Barthell is an 82 year old male with a history of having had a right intertrochanteric hip fracture about 6 months ago.  I treated that.  He did well, was back home doing well, but unfortunately fell on his left side today.  He went to the emergency room where he was noted to have a left basicervical femoral neck fracture.  He was transferred down to [facility] for treatment of left basicervical femoral neck fracture.  DESCRIPTION OF PROCEDURE:  The patient was taken to the operating room after adequate anesthesia was obtained with general anesthetic, patient was placed supine on the operating table.  Left leg was prepped and draped in usual sterile fashion.  He was placed on the [place] bed just to make sure if that basicervical femoral neck was a true femoral neck that we might have to do an anterior hemiarthroplasty.  Once he was put on the table, using a manipulative closed reduction, we got an anatomic reduction and it did turn out in fact to be a basicervical femoral neck. At this time, he was prepped and draped in usual sterile fashion. Traction was put in place with slight internal rotation.  At this point, we made an  incision proximal to the trochanter; put a guidewire down the center of the femur on AP and lateral, entered, did an introductory reamer and then put a guidewire down, measured it to a 13 mm reamer and 11 mm rod, put a derotational screw and superior before we put in 105 mm screw and then did compression.  We then tightened it down proximally. We then put a single distal interlock in and once that was placed, we put a single bolt in the oblong screw on the far side.  At this point, the wounds were all irrigated, suctioned dry, closed in layers.  Sterile compressive dressing was applied.  Patient was taken to the recovery room, was noted to be in satisfactory condition.  Estimated blood loss for procedure was 250 mL.     Alta Corning, M.D.     Corliss Skains  D:  09/17/2015  T:  09/18/2015  Job:  [id number]

## 2015-09-19 ENCOUNTER — Encounter (HOSPITAL_COMMUNITY): Payer: Self-pay | Admitting: Orthopedic Surgery

## 2015-09-19 DIAGNOSIS — S72002S Fracture of unspecified part of neck of left femur, sequela: Secondary | ICD-10-CM

## 2015-09-19 LAB — GLUCOSE, CAPILLARY
Glucose-Capillary: 113 mg/dL — ABNORMAL HIGH (ref 65–99)
Glucose-Capillary: 193 mg/dL — ABNORMAL HIGH (ref 65–99)
Glucose-Capillary: 107 mg/dL — ABNORMAL HIGH (ref 65–99)
Glucose-Capillary: 107 mg/dL — ABNORMAL HIGH (ref 65–99)

## 2015-09-19 LAB — CBC
HEMATOCRIT: 25 % — AB (ref 39.0–52.0)
HEMOGLOBIN: 7.6 g/dL — AB (ref 13.0–17.0)
MCH: 28.1 pg (ref 26.0–34.0)
MCHC: 30.4 g/dL (ref 30.0–36.0)
MCV: 92.6 fL (ref 78.0–100.0)
Platelets: 175 10*3/uL (ref 150–400)
RBC: 2.7 MIL/uL — AB (ref 4.22–5.81)
RDW: 14.4 % (ref 11.5–15.5)
WBC: 10.2 10*3/uL (ref 4.0–10.5)

## 2015-09-19 LAB — PREPARE RBC (CROSSMATCH)

## 2015-09-19 MED ORDER — SODIUM CHLORIDE 0.9 % IV SOLN
Freq: Once | INTRAVENOUS | Status: AC
  Administered 2015-09-19: 14:00:00 via INTRAVENOUS

## 2015-09-19 MED ORDER — FUROSEMIDE 10 MG/ML IJ SOLN
10.0000 mg | Freq: Once | INTRAMUSCULAR | Status: AC
  Administered 2015-09-19: 10 mg via INTRAVENOUS
  Filled 2015-09-19: qty 2

## 2015-09-19 MED ORDER — ENSURE ENLIVE PO LIQD
237.0000 mL | Freq: Two times a day (BID) | ORAL | Status: DC
  Administered 2015-09-19 – 2015-09-20 (×2): 237 mL via ORAL

## 2015-09-19 NOTE — Progress Notes (Signed)
Initial Nutrition Assessment  DOCUMENTATION CODES:   Not applicable  INTERVENTION:   Ensure Enlive po BID, each supplement provides 350 kcal and 20 grams of protein  NUTRITION DIAGNOSIS:   Increased nutrient needs related to wound healing as evidenced by estimated needs.  GOAL:   Patient will meet greater than or equal to 90% of their needs  MONITOR:   PO intake, Supplement acceptance, I & O's  REASON FOR ASSESSMENT:   Consult Hip fracture protocol  ASSESSMENT:   82 y/o with dementia that presented to the hospital after a fall and diagnosed with Left closed hip fracture.   5/28 ORIF left hip NFPE completed, no depletion noted.  Pt unable to answer questions about nutrition. No family present.   Medications reviewed and include: colace, ferrous sulfate, vitamin B 12 Labs reviewed. CBG's: 111-115  Diet Order:  DIET SOFT Room service appropriate?: Yes; Fluid consistency:: Thin  Skin:  Reviewed, no issues (Right hip incision)  Last BM:  unknown  Height:   Ht Readings from Last 1 Encounters:  09/19/15 5\' 9"  (1.753 m)    Weight:   Wt Readings from Last 1 Encounters:  09/17/15 195 lb (88.451 kg)    Ideal Body Weight:  65.9 kg  BMI:  Body mass index is 28.78 kg/(m^2).  Estimated Nutritional Needs:   Kcal:  1800-2000  Protein:  90-105 grams  Fluid:  > 1.8 L/day  EDUCATION NEEDS:   No education needs identified at this time  [place], [place], [place] Pager [phone] After Hours Pager

## 2015-09-19 NOTE — Clinical Social Work Note (Signed)
Clinical Social Work Assessment  Patient Details  Name: David Collier MRN: [id number] Date of Birth: [birth year]  Date of referral:  09/19/15               Reason for consult:  Facility Placement                Permission sought to share information with:  Family Supports, Customer service manager Permission granted to share information::  Yes, Verbal Permission Granted  Name::     David Collier, daughter [phone]  Agency::     Relationship::     Contact Information:     Housing/Transportation Living arrangements for the past 2 months:  [place] of Information:  Adult Children (daughter David Collier) Patient Interpreter Needed:  None Criminal Activity/Legal Involvement Pertinent to Current Situation/Hospitalization:    Significant Relationships:  Adult Children Lives with:  Spouse Do you feel safe going back to the place where you live?    Need for family participation in patient care:  No (Coment)  Care giving concerns:  Daughter has concerns regarding patient's return to [place].  Patient has fallen twice.   Social Worker assessment / plan:  Patient is alert though only oriented to self.  Patient has hx dx of advanced dementia.  CSW spoke with patient's daughter, David Collier who reports the patient is a LTC resident at [facility].  Daughter reports they would not like patient to return.  CSW educated patient's daughter regarding LTC placement and how typically there is a waiting list for LTC at SNF facilities and it may not be possible to place patient at another facility, but CSW would make every attempt.  Daughter acknowledged understanding.  Of note, patient was also hospitalized in March for hip surgery where when he was discharged, he was discharged to SNF so unsure of patient's available Medicare days.  Daughter acknowledged understanding.  Employment status:  Retired Forensic scientist:  Medicare PT Recommendations:  [place] / Referral to community resources:  [place]  Patient/Family's Response to care:  Daughter is agreeable for placement and understands that the patient may have to return to [place], but CSW will attempt LTC placement.  Patient/Family's Understanding of and Emotional Response to Diagnosis, Current Treatment, and Prognosis:  Daughter, David Collier is very realistic regarding patient's prognosis and projected quality of life.  David Collier expressed guilt in not being able to move the patient from [place] sooner. CSW offered support.  Emotional Assessment Appearance:    Attitude/Demeanor/Rapport:    Affect (typically observed):  Unable to Assess Orientation:  Oriented to Self Alcohol / Substance use:  Not Applicable Psych involvement (Current and /or in the community):  No (Comment)  Discharge Needs  Concerns to be addressed:  No discharge needs identified Readmission within the last 30 days:  No Current discharge risk:  Cognitively Impaired Barriers to Discharge:  Continued Medical Work up   [employer], LCSW 09/19/2015, 10:24 AM

## 2015-09-19 NOTE — Clinical Social Work Placement (Signed)
   CLINICAL SOCIAL WORK PLACEMENT  NOTE  Date:  09/19/2015  Patient Details  Name: David Collier MRN: [id number] Date of Birth: [birth year]  Clinical Social Work is seeking post-discharge placement for this patient at the [place] level of care (*CSW will initial, date and re-position this form in  chart as items are completed):  Yes   Patient/family provided with [place] Work Department's list of facilities offering this level of care within the geographic area requested by the patient (or if unable, by the patient's family).  Yes   Patient/family informed of their freedom to choose among providers that offer the needed level of care, that participate in Medicare, Medicaid or managed care program needed by the patient, have an available bed and are willing to accept the patient.  Yes   Patient/family informed of [facility]'s ownership interest in [facility] and [facility], as well as of the fact that they are under no obligation to receive care at these facilities.  PASRR submitted to EDS on 09/19/15     PASRR number received on       Existing PASRR number confirmed on 09/19/15     FL2 transmitted to all facilities in geographic area requested by pt/family on 09/19/15     FL2 transmitted to all facilities within larger geographic area on       Patient informed that his/her managed care company has contracts with or will negotiate with certain facilities, including the following:            Patient/family informed of bed offers received.  Patient chooses bed at       Physician recommends and patient chooses bed at      Patient to be transferred to   on  .  Patient to be transferred to facility by       Patient family notified on   of transfer.  Name of family member notified:        PHYSICIAN Please sign FL2     Additional Comment:    _______________________________________________ Dulcy Fanny, LCSW 09/19/2015,  10:37 AM

## 2015-09-19 NOTE — Progress Notes (Signed)
PROGRESS NOTE    David Collier  [id number] DOB: Sep 10, 1928 DOA: 09/17/2015 PCP: Renata Caprice, DO   Brief Narrative:  82 y/o with dementia that presented to the hospital after a fall and diagnosed with Left closed hip fracture.    Assessment & Plan:    Principal Problem:   Closed left hip fracture (HCC) -  2 Day Post-Op Procedure(s) (LRB): INTRAMEDULLARY (IM) NAIL FEMORAL (Left) - Ortho on board and managing - PT on board - Continue supportive therapy  Active Problems:   Essential hypertension -Stable on beta blocker    CAD (coronary artery disease), native coronary artery - Continue aspirin    CKD (chronic kidney disease), stage III - Stable    Dementia - Stable continue current regimen    Personal history of transient ischemic attack (TIA) and cerebral infarction without residual deficit   Acute blood loss anemia - Blood levels steady   DVT prophylaxis: Deferred to orthopedic surgery given principal problem Code Status: DO NOT RESUSCITATE Family Communication: Discussed with daughter at bedside Disposition Plan: Pending physical therapy evaluation and once patient is cleared for discharge by Ortho   Consultants:   Orthopedic surgery   Procedures: please see above   Antimicrobials: None   Subjective: Pt has no new complaints. No acute issues overnight.  Objective: Filed Vitals:   09/19/15 0428 09/19/15 1111 09/19/15 1347 09/19/15 1424  BP: 145/78  91/42 90/69  Pulse: 107  75 77  Temp: 97.9 F (36.6 C)  98.8 F (37.1 C) 97.7 F (36.5 C)  TempSrc: Axillary  Oral Axillary  Resp: 18  18 18   Height:  5\' 9"  (1.753 m)    Weight:      SpO2: 90%  98% 95%    Intake/Output Summary (Last 24 hours) at 09/19/15 1528 Last data filed at 09/19/15 1424  Gross per 24 hour  Intake    392 ml  Output    650 ml  Net   -258 ml   Filed Weights   09/17/15 1127  Weight: 88.451 kg (195 lb)    Examination:  General exam: awake and alert, in  nad Respiratory system: Clear to auscultation. Respiratory effort normal. Cardiovascular system: S1 & S2 heard,No JVD, murmurs, rubs, gallops or clicks. Gastrointestinal system: Abdomen is nondistended, soft and nontender. No organomegaly or masses felt. Normal bowel sounds heard. Central nervous system: Alert. Equal tone Extremities: Symmetric 5 x 5 power. Skin: No rashes, lesions or ulcers Psychiatry: unable to accurately assess due to dementia  Data Reviewed: I have personally reviewed following labs and imaging studies  CBC:  Recent Labs Lab 09/17/15 1308 09/18/15 0212 09/19/15 0356  WBC 14.4* 14.9* 10.2  NEUTROABS 12.2*  --   --   HGB 9.4* 8.8* 7.6*  HCT 30.4* 29.7* 25.0*  MCV 94.7 95.8 92.6  PLT 191 179 0000000   Basic Metabolic Panel:  Recent Labs Lab 09/17/15 1308 09/18/15 09/18/15 0212  NA 139 140 140  K 4.2 4.1 4.4  CL 108 109 107  CO2 26 26 26   GLUCOSE 134* 130* 128*  BUN 32* 27* 26*  CREATININE 1.32* 1.35* 1.31*  CALCIUM 8.6* 8.3* 8.6*   GFR: Estimated Creatinine Clearance: 46.2 mL/min (by C-G formula based on Cr of 1.31). Liver Function Tests: No results for input(s): AST, ALT, ALKPHOS, BILITOT, PROT, ALBUMIN in the last 168 hours. No results for input(s): LIPASE, AMYLASE in the last 168 hours. No results for input(s): AMMONIA in the last 168 hours. Coagulation Profile:  Recent  Labs Lab 09/17/15 1308  INR 1.32   Cardiac Enzymes:  Recent Labs Lab 09/17/15 1308  TROPONINI <0.03   BNP (last 3 results) No results for input(s): PROBNP in the last 8760 hours. HbA1C: No results for input(s): HGBA1C in the last 72 hours. CBG:  Recent Labs Lab 09/18/15 1709 09/18/15 2110 09/19/15 0635 09/19/15 1231  GLUCAP 111* 115* 113* 193*   Lipid Profile: No results for input(s): CHOL, HDL, LDLCALC, TRIG, CHOLHDL, LDLDIRECT in the last 72 hours. Thyroid Function Tests: No results for input(s): TSH, T4TOTAL, FREET4, T3FREE, THYROIDAB in the last 72  hours. Anemia Panel: No results for input(s): VITAMINB12, FOLATE, FERRITIN, TIBC, IRON, RETICCTPCT in the last 72 hours. Sepsis Labs: No results for input(s): PROCALCITON, LATICACIDVEN in the last 168 hours.  Recent Results (from the past 240 hour(s))  MRSA PCR Screening     Status: None   Collection Time: 09/18/15  8:49 AM  Result Value Ref Range Status   MRSA by PCR NEGATIVE NEGATIVE Final    Comment:        The GeneXpert MRSA Assay (FDA approved for NASAL specimens only), is one component of a comprehensive MRSA colonization surveillance program. It is not intended to diagnose MRSA infection nor to guide or monitor treatment for MRSA infections.   Urine culture     Status: None (Preliminary result)   Collection Time: 09/18/15  8:54 AM  Result Value Ref Range Status   Specimen Description URINE, CATHETERIZED  Final   Special Requests NONE  Final   Culture CULTURE REINCUBATED FOR BETTER GROWTH  Final   Report Status PENDING  Incomplete         Radiology Studies: Dg C-arm 61-120 Min  09/18/2015  CLINICAL DATA:  Left hip surgery. EXAM: DG C-ARM 61-120 MIN; LEFT FEMUR 2 VIEWS COMPARISON:  Radiographs earlier this day. FINDINGS: Four fluoroscopic spot images from the operating room during left femur ORIF demonstrate intra medullary rod with distal and proximal trans trochanteric screws traversing left proximal femur fracture. Fluoroscopy time not provided. IMPRESSION: Intraoperative fluoroscopic spot images post intra medullary rod and screw fixation of proximal left femur fracture. Electronically Signed   By: Jeb Levering M.D.   On: 09/18/2015 01:41   Dg Femur Min 2 Views Left  09/18/2015  CLINICAL DATA:  Left hip surgery. EXAM: DG C-ARM 61-120 MIN; LEFT FEMUR 2 VIEWS COMPARISON:  Radiographs earlier this day. FINDINGS: Four fluoroscopic spot images from the operating room during left femur ORIF demonstrate intra medullary rod with distal and proximal trans trochanteric  screws traversing left proximal femur fracture. Fluoroscopy time not provided. IMPRESSION: Intraoperative fluoroscopic spot images post intra medullary rod and screw fixation of proximal left femur fracture. Electronically Signed   By: Jeb Levering M.D.   On: 09/18/2015 01:41        Scheduled Meds: . aspirin EC  325 mg Oral Q breakfast  . divalproex  750 mg Oral QHS  . docusate sodium  100 mg Oral BID  . feeding supplement (ENSURE ENLIVE)  237 mL Oral BID BM  . ferrous sulfate  325 mg Oral Q breakfast  . fluticasone  1 spray Each Nare BID  . furosemide  10 mg Intravenous Once  . insulin aspart  0-9 Units Subcutaneous TID WC  . loratadine  10 mg Oral Daily  . metoprolol  12.5 mg Oral BID  . pantoprazole  20 mg Oral Daily  . QUEtiapine  25 mg Oral QHS  . sertraline  100 mg  Oral Daily  . cyanocobalamin  500 mcg Oral Daily   Continuous Infusions: . dextrose 5 % and 0.45% NaCl 50 mL/hr at 09/17/15 2345     LOS: 2 days    Time spent: > 20 minutes    Velvet Bathe, MD [facility] Pager [phone]  If 7PM-7AM, please contact night-coverage [email or web address] Password [facility] 09/19/2015, 3:28 PM

## 2015-09-19 NOTE — Progress Notes (Signed)
Subjective: 2 Days Post-Op Procedure(s) (LRB): INTRAMEDULLARY (IM) NAIL FEMORAL (Left) Patient reports pain as mild.  Patient's family in room. They report that he is "sleeping a lot "  Objective: Vital signs in last 24 hours: Temp:  [97.9 F (36.6 C)-98.2 F (36.8 C)] 97.9 F (36.6 C) (05/29 0428) Pulse Rate:  [93-107] 107 (05/29 0428) Resp:  [18] 18 (05/29 0428) BP: (101-145)/(62-78) 145/78 mmHg (05/29 0428) SpO2:  [90 %-98 %] 90 % (05/29 0428)  Intake/Output from previous day: 05/28 0701 - 05/29 0700 In: 360 [P.O.:360] Out: 865 [Urine:865] Intake/Output this shift:     Recent Labs  09/17/15 1308 09/18/15 0212 09/19/15 0356  HGB 9.4* 8.8* 7.6*    Recent Labs  09/18/15 0212 09/19/15 0356  WBC 14.9* 10.2  RBC 3.10* 2.70*  HCT 29.7* 25.0*  PLT 179 175    Recent Labs  09/18/15 09/18/15 0212  NA 140 140  K 4.1 4.4  CL 109 107  CO2 26 26  BUN 27* 26*  CREATININE 1.35* 1.31*  GLUCOSE 130* 128*  CALCIUM 8.3* 8.6*    Recent Labs  09/17/15 1308  INR 1.32  Patient appears somnolent, but arousable. He is hard of hearing. Left hip exam:  Neurovascular intact Intact pulses distally Dorsiflexion/Plantar flexion intact Incision: scant drainage Compartment soft Some mild bleeding under proximal left hip dressing. Nothing active. Assessment/Plan: 2 Days Post-Op Procedure(s) (LRB): INTRAMEDULLARY (IM) NAIL FEMORAL (Left) Acute blood loss anemia. Plan: Transfuse 2 units of packed RBCs today. Recheck CBC in a.m. Touchdown weightbearing only on left leg. Up with therapy Discharge to SNF possibly tomorrow. Aspirin 325 mg enteric-coated 1 daily along with SCDs for DVT prophylaxis. [place] G 09/19/2015, 12:46 PM

## 2015-09-20 ENCOUNTER — Inpatient Hospital Stay (HOSPITAL_COMMUNITY): Payer: Medicare Other

## 2015-09-20 DIAGNOSIS — I10 Essential (primary) hypertension: Secondary | ICD-10-CM | POA: Diagnosis not present

## 2015-09-20 DIAGNOSIS — F0150 Vascular dementia without behavioral disturbance: Secondary | ICD-10-CM | POA: Diagnosis present

## 2015-09-20 DIAGNOSIS — A419 Sepsis, unspecified organism: Secondary | ICD-10-CM | POA: Diagnosis not present

## 2015-09-20 DIAGNOSIS — E785 Hyperlipidemia, unspecified: Secondary | ICD-10-CM | POA: Diagnosis not present

## 2015-09-20 DIAGNOSIS — Z7982 Long term (current) use of aspirin: Secondary | ICD-10-CM | POA: Diagnosis not present

## 2015-09-20 DIAGNOSIS — F0280 Dementia in other diseases classified elsewhere without behavioral disturbance: Secondary | ICD-10-CM | POA: Diagnosis present

## 2015-09-20 DIAGNOSIS — N183 Chronic kidney disease, stage 3 (moderate): Secondary | ICD-10-CM | POA: Diagnosis not present

## 2015-09-20 DIAGNOSIS — A4151 Sepsis due to Escherichia coli [E. coli]: Secondary | ICD-10-CM | POA: Diagnosis not present

## 2015-09-20 DIAGNOSIS — G309 Alzheimer's disease, unspecified: Secondary | ICD-10-CM | POA: Diagnosis not present

## 2015-09-20 DIAGNOSIS — Z66 Do not resuscitate: Secondary | ICD-10-CM | POA: Diagnosis not present

## 2015-09-20 DIAGNOSIS — J189 Pneumonia, unspecified organism: Secondary | ICD-10-CM | POA: Diagnosis not present

## 2015-09-20 DIAGNOSIS — Z8673 Personal history of transient ischemic attack (TIA), and cerebral infarction without residual deficits: Secondary | ICD-10-CM | POA: Diagnosis not present

## 2015-09-20 DIAGNOSIS — M6281 Muscle weakness (generalized): Secondary | ICD-10-CM | POA: Diagnosis not present

## 2015-09-20 DIAGNOSIS — Z79891 Long term (current) use of opiate analgesic: Secondary | ICD-10-CM | POA: Diagnosis not present

## 2015-09-20 DIAGNOSIS — J811 Chronic pulmonary edema: Secondary | ICD-10-CM | POA: Diagnosis not present

## 2015-09-20 DIAGNOSIS — H3530 Unspecified macular degeneration: Secondary | ICD-10-CM | POA: Diagnosis not present

## 2015-09-20 DIAGNOSIS — R1312 Dysphagia, oropharyngeal phase: Secondary | ICD-10-CM | POA: Diagnosis not present

## 2015-09-20 DIAGNOSIS — S72002A Fracture of unspecified part of neck of left femur, initial encounter for closed fracture: Secondary | ICD-10-CM | POA: Diagnosis not present

## 2015-09-20 DIAGNOSIS — Z7951 Long term (current) use of inhaled steroids: Secondary | ICD-10-CM | POA: Diagnosis not present

## 2015-09-20 DIAGNOSIS — Z955 Presence of coronary angioplasty implant and graft: Secondary | ICD-10-CM | POA: Diagnosis not present

## 2015-09-20 DIAGNOSIS — N179 Acute kidney failure, unspecified: Secondary | ICD-10-CM | POA: Diagnosis not present

## 2015-09-20 DIAGNOSIS — I200 Unstable angina: Secondary | ICD-10-CM | POA: Diagnosis not present

## 2015-09-20 DIAGNOSIS — S72002D Fracture of unspecified part of neck of left femur, subsequent encounter for closed fracture with routine healing: Secondary | ICD-10-CM | POA: Diagnosis not present

## 2015-09-20 DIAGNOSIS — Z87891 Personal history of nicotine dependence: Secondary | ICD-10-CM | POA: Diagnosis not present

## 2015-09-20 DIAGNOSIS — Z8249 Family history of ischemic heart disease and other diseases of the circulatory system: Secondary | ICD-10-CM | POA: Diagnosis not present

## 2015-09-20 DIAGNOSIS — R4182 Altered mental status, unspecified: Secondary | ICD-10-CM | POA: Diagnosis present

## 2015-09-20 DIAGNOSIS — D62 Acute posthemorrhagic anemia: Secondary | ICD-10-CM | POA: Diagnosis not present

## 2015-09-20 DIAGNOSIS — F0391 Unspecified dementia with behavioral disturbance: Secondary | ICD-10-CM | POA: Diagnosis not present

## 2015-09-20 DIAGNOSIS — F0390 Unspecified dementia without behavioral disturbance: Secondary | ICD-10-CM

## 2015-09-20 DIAGNOSIS — R509 Fever, unspecified: Secondary | ICD-10-CM | POA: Diagnosis not present

## 2015-09-20 DIAGNOSIS — R7881 Bacteremia: Secondary | ICD-10-CM | POA: Diagnosis not present

## 2015-09-20 DIAGNOSIS — N390 Urinary tract infection, site not specified: Secondary | ICD-10-CM | POA: Diagnosis not present

## 2015-09-20 DIAGNOSIS — R404 Transient alteration of awareness: Secondary | ICD-10-CM | POA: Diagnosis not present

## 2015-09-20 DIAGNOSIS — B9620 Unspecified Escherichia coli [E. coli] as the cause of diseases classified elsewhere: Secondary | ICD-10-CM | POA: Diagnosis not present

## 2015-09-20 DIAGNOSIS — I252 Old myocardial infarction: Secondary | ICD-10-CM | POA: Diagnosis not present

## 2015-09-20 DIAGNOSIS — R278 Other lack of coordination: Secondary | ICD-10-CM | POA: Diagnosis not present

## 2015-09-20 DIAGNOSIS — I129 Hypertensive chronic kidney disease with stage 1 through stage 4 chronic kidney disease, or unspecified chronic kidney disease: Secondary | ICD-10-CM | POA: Diagnosis not present

## 2015-09-20 DIAGNOSIS — I2510 Atherosclerotic heart disease of native coronary artery without angina pectoris: Secondary | ICD-10-CM | POA: Diagnosis not present

## 2015-09-20 DIAGNOSIS — I25811 Atherosclerosis of native coronary artery of transplanted heart without angina pectoris: Secondary | ICD-10-CM | POA: Diagnosis not present

## 2015-09-20 DIAGNOSIS — M19012 Primary osteoarthritis, left shoulder: Secondary | ICD-10-CM | POA: Diagnosis not present

## 2015-09-20 DIAGNOSIS — I672 Cerebral atherosclerosis: Secondary | ICD-10-CM | POA: Diagnosis not present

## 2015-09-20 DIAGNOSIS — S72002S Fracture of unspecified part of neck of left femur, sequela: Secondary | ICD-10-CM | POA: Diagnosis not present

## 2015-09-20 DIAGNOSIS — Z809 Family history of malignant neoplasm, unspecified: Secondary | ICD-10-CM | POA: Diagnosis not present

## 2015-09-20 LAB — TYPE AND SCREEN
ABO/RH(D): A POS
Antibody Screen: NEGATIVE
UNIT DIVISION: 0
UNIT DIVISION: 0

## 2015-09-20 LAB — URINE CULTURE: Culture: 50000 — AB

## 2015-09-20 LAB — CBC
HCT: 31.8 % — ABNORMAL LOW (ref 39.0–52.0)
Hemoglobin: 9.9 g/dL — ABNORMAL LOW (ref 13.0–17.0)
MCH: 28 pg (ref 26.0–34.0)
MCHC: 31.1 g/dL (ref 30.0–36.0)
MCV: 90.1 fL (ref 78.0–100.0)
PLATELETS: 188 10*3/uL (ref 150–400)
RBC: 3.53 MIL/uL — ABNORMAL LOW (ref 4.22–5.81)
RDW: 15.6 % — AB (ref 11.5–15.5)
WBC: 10.5 10*3/uL (ref 4.0–10.5)

## 2015-09-20 LAB — GLUCOSE, CAPILLARY
GLUCOSE-CAPILLARY: 116 mg/dL — AB (ref 65–99)
Glucose-Capillary: 104 mg/dL — ABNORMAL HIGH (ref 65–99)
Glucose-Capillary: 100 mg/dL — ABNORMAL HIGH (ref 65–99)

## 2015-09-20 MED ORDER — LEVOFLOXACIN 500 MG PO TABS: 500.0000 mg | ORAL_TABLET | Freq: Every day | ORAL | Status: DC

## 2015-09-20 MED ORDER — METOPROLOL TARTRATE 25 MG PO TABS: 12.5000 mg | ORAL_TABLET | Freq: Two times a day (BID) | ORAL | Status: DC

## 2015-09-20 MED ORDER — ENSURE ENLIVE PO LIQD: 237.0000 mL | Freq: Two times a day (BID) | ORAL | Status: DC

## 2015-09-20 MED ORDER — ASPIRIN 325 MG PO TBEC: 325.0000 mg | DELAYED_RELEASE_TABLET | Freq: Every day | ORAL | Status: AC

## 2015-09-20 MED ORDER — ACETAMINOPHEN 325 MG PO TABS: 650.0000 mg | ORAL_TABLET | Freq: Four times a day (QID) | ORAL | Status: DC | PRN

## 2015-09-20 MED ORDER — LORAZEPAM 0.5 MG PO TABS: 0.5000 mg | ORAL_TABLET | Freq: Every day | ORAL | Status: DC | PRN

## 2015-09-20 NOTE — Care Management Note (Signed)
Case Management Note  Patient Details  Name: David Collier MRN: [id number] Date of Birth: 08/24/1928  Subjective/Objective:           Admitted with left hip fracture, s/p left hip IM nail         Action/Plan: PT/OT recommended SNF, referral made to [place], [place] working on SNF placement for short term rehab.  Expected Discharge Date:  09/20/15               Expected Discharge Plan:  Skilled Nursing Facility  In-House Referral:  Clinical Social Work  Discharge planning Services  CM Consult  Post Acute Care Choice:    Choice offered to:     DME Arranged:    DME Agency:     HH Arranged:    [place] Agency:     Status of Service:  In process, will continue to follow  Medicare Important Message Given:  Yes Date Medicare IM Given:    Medicare IM give by:    Date Additional Medicare IM Given:    Additional Medicare Important Message give by:     If discussed at [place] of Stay Meetings, dates discussed:    Additional Comments:  Nila Nephew, RN 09/20/2015, 10:26 AM

## 2015-09-20 NOTE — Progress Notes (Signed)
Subjective: 3 Days Post-Op Procedure(s) (LRB): INTRAMEDULLARY (IM) NAIL FEMORAL (Left) Patient reports pain as mild.    Objective: Vital signs in last 24 hours: Temp:  [97.6 F (36.4 C)-98.9 F (37.2 C)] 97.6 F (36.4 C) (05/30 0556) Pulse Rate:  [73-95] 95 (05/30 0556) Resp:  [16-18] 16 (05/30 0556) BP: (90-153)/(42-81) 153/72 mmHg (05/30 0556) SpO2:  [95 %-100 %] 100 % (05/30 0556)  Intake/Output from previous day: 05/29 0701 - 05/30 0700 In: 1410.5 [P.O.:440; I.V.:550; Blood:420.5] Out: -  Intake/Output this shift: Total I/O In: 120 [P.O.:120] Out: -    Recent Labs  09/17/15 1308 09/18/15 0212 09/19/15 0356 09/20/15 0333  HGB 9.4* 8.8* 7.6* 9.9*    Recent Labs  09/19/15 0356 09/20/15 0333  WBC 10.2 10.5  RBC 2.70* 3.53*  HCT 25.0* 31.8*  PLT 175 188    Recent Labs  09/18/15 09/18/15 0212  NA 140 140  K 4.1 4.4  CL 109 107  CO2 26 26  BUN 27* 26*  CREATININE 1.35* 1.31*  GLUCOSE 130* 128*  CALCIUM 8.3* 8.6*    Recent Labs  09/17/15 1308  INR 1.32  left hip exam:  Intact pulses distally Dorsiflexion/Plantar flexion intact Incision: dressing C/D/I No cellulitis present Compartment soft  Assessment/Plan: 3 Days Post-Op Procedure(s) (LRB): INTRAMEDULLARY (IM) NAIL FEMORAL (Left)   Plan: Up with therapy Discharge to SNF Touchdown weightbearing only on left. Aspirin 325 mg enteric-coated 1 daily for DVT prophylaxis. Follow-up with Dr. Berenice Primas in 2 weeks. [place] G 09/20/2015, 10:20 AM

## 2015-09-20 NOTE — Progress Notes (Signed)
Physical Therapy Treatment Patient Details Name: David Collier MRN: [id number] DOB: 12/02/1928 Today's Date: 09/20/2015    History of Present Illness David Collier is a 82 y.o. male with PMH significant of advanced dementia, CAD status post stenting, CKD III, chronic anemia, TIA was brought to the ER patient had persistent pain in his left hip. As per the daughter patient had a fall yesterday after trying to get out of the bed. Xray showed left hip fx, underwent IM nail. Of note: pt presented with same dx on right in 3/17    PT Comments    Pt making gradual progress with PT. Pt with improved tolerance to bed mobility but unable to safely attempt transfers at this time. Based upon the patient's current mobility level, continue to recommend SNF upon D/C.   Follow Up Recommendations  SNF;Supervision/Assistance - 24 hour     Equipment Recommendations  None recommended by PT    Recommendations for Other Services       Precautions / Restrictions Precautions Precautions: Fall Restrictions Weight Bearing Restrictions: Yes LLE Weight Bearing: Touchdown weight bearing    Mobility  Bed Mobility Overal bed mobility: Needs Assistance;+2 for physical assistance Bed Mobility: Supine to Sit;Sit to Supine     Supine to sit: Total assist;+2 for physical assistance Sit to supine: +2 for physical assistance;Mod assist   General bed mobility comments: Pt wanting to lay back down in bed once sitting. Improved sitting tolerance with distraction. Pt able to lean trunk back and partially raise LEs to get into bed. Poor safety awareness.   Transfers                 General transfer comment: unable to safety perform  Ambulation/Gait                 Stairs            Wheelchair Mobility    Modified Rankin (Stroke Patients Only)       Balance Overall balance assessment: Needs assistance Sitting-balance support: Feet supported;Bilateral upper extremity  supported Sitting balance-Leahy Scale: Poor Sitting balance - Comments: balance varying from max assist to min guard.                             Cognition Arousal/Alertness: Awake/alert Behavior During Therapy: Anxious Overall Cognitive Status: History of cognitive impairments - at baseline                      Exercises      General Comments        Pertinent Vitals/Pain Pain Assessment: Faces Faces Pain Scale: Hurts little more Pain Location: Lt hip Pain Descriptors / Indicators: Guarding;Grimacing Pain Intervention(s): Limited activity within patient's tolerance;Monitored during session    Home Living                      Prior Function            PT Goals (current goals can now be found in the care plan section) Acute Rehab PT Goals Patient Stated Goal: not expressed PT Goal Formulation: With family Time For Goal Achievement: 10/02/15 Potential to Achieve Goals: Fair Progress towards PT goals: Progressing toward goals    Frequency  Min 2X/week    PT Plan Current plan remains appropriate    Co-evaluation             End of Session  Activity Tolerance: No increased pain (limited by confusion and pt lack of participation. ) Patient left: in bed;with call bell/phone within reach;with bed alarm set;with SCD's reapplied;with family/visitor present     Time: 1152-1209 PT Time Calculation (min) (ACUTE ONLY): 17 min  Charges:  $Therapeutic Activity: 8-22 mins                    G Codes:      David Collier, PT, CSCS Pager [phone] Office [phone]  09/20/2015, 3:39 PM

## 2015-09-20 NOTE — Clinical Social Work Note (Addendum)
11:17am- CSW spoke with Debbie at [facility] who has made a bed offer.  CSW made Debbie aware that patient is LTC patient.  Jackelyn Poling states she will review for LTC and have a decision to [place] shortly.  CSW awaiting a decision from SNF.  11:08am- CSW met with patient's daughter at bedside.  Patient's daughters are wishing for patient not to return to [facility] and wishes for CSW to send referral to [facility].  CSW sent referral Monday 5/29, but due to holiday and no admission's coordinator at SNF, patient will be reviewed today.  CSW advised daughter that if [facility] is not able to accept the patient for LTC, the patient's options will be home vs return to [facility] SNF.  Daughter acknowledged understanding.  Of note, patient will need PTAR transportation.  Nonnie Done, LCSW [phone]  2H 1-14; [place] Licensed Clinical Social Worker

## 2015-09-20 NOTE — Care Management Important Message (Signed)
Important Message  Patient Details  Name: David Collier MRN: [id number] Date of Birth: 1929/01/01   Medicare Important Message Given:  Yes    Loann Quill 09/20/2015, 8:02 AM

## 2015-09-20 NOTE — Discharge Summary (Addendum)
Physician Discharge Summary  David Collier [id number] DOB: January 07, 1929 DOA: 09/17/2015  PCP: Renata Caprice, DO  Admit date: 09/17/2015 Discharge date: 09/20/2015  Time spent: > 35 minutes  Recommendations for Outpatient Follow-up:  1. Monitor hemoglobin levels 2. 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   Hospital Course:  Principal Problem:  Closed left hip fracture (HCC) - 2 Day Post-Op Procedure(s) (LRB): INTRAMEDULLARY (IM) NAIL FEMORAL (Left) - Ortho on board and recommended the following: Up with therapy Discharge to SNF Touchdown weightbearing only on left. Aspirin 325 mg enteric-coated 1 daily for DVT prophylaxis. Follow-up with Dr. Berenice Primas in 2 weeks.  Active Problems:  Essential hypertension -Stable on beta blocker   CAD (coronary artery disease), native coronary artery - Continue aspirin   CKD (chronic kidney disease), stage III - Stable   Dementia - Stable continue current regimen   Personal history of transient ischemic attack (TIA) and cerebral infarction without residual deficit  Acute blood loss anemia - Blood levels steady  Procedures: 3 Days Post-Op Procedure(s) (LRB): INTRAMEDULLARY (IM) NAIL FEMORAL (Left)   Consultations: ORtho  Discharge Exam: Filed  Vitals:   09/20/15 0200 09/20/15 0556  BP:  153/72  Pulse:  95  Temp:  97.6 F (36.4 C)  Resp: 16 16    General: Pt in nad, alert and awake Cardiovascular: rrr, no rubs Respiratory: no increased wob, cta BL  Discharge Instructions   Discharge Instructions    Call MD for:  difficulty breathing, headache or visual disturbances    Complete by:  As directed      Call MD for:  severe uncontrolled pain    Complete by:  As directed      Call MD for:  temperature >100.4    Complete by:  As directed      Diet - low sodium heart healthy    Complete by:  As directed      Discharge instructions    Complete by:  As directed   Pt to f/u with his orthopaedic surgeon after discharge, please call their offices to confirm follow up appointment.     Increase activity slowly    Complete by:  As directed      Touch down weight bearing    Complete by:  As directed   Laterality:  left  Extremity:  Lower          Current Discharge Medication List    START taking these medications   Details  acetaminophen (TYLENOL) 325 MG tablet Take 2 tablets (650 mg total) by mouth every 6 (six) hours as needed for mild pain (or Fever >/= 101). Qty: 30 tablet, Refills: 0    feeding supplement, ENSURE ENLIVE, (ENSURE ENLIVE) LIQD Take 237 mLs by mouth 2 (two) times daily between meals. Qty:  237 mL, Refills: 0    levofloxacin (LEVAQUIN) 500 MG tablet Take 1 tablet (500 mg total) by mouth daily. Qty: 7 tablet, Refills: 0      CONTINUE these medications which have CHANGED   Details  aspirin EC 325 MG EC tablet Take 1 tablet (325 mg total) by mouth daily with breakfast. Qty: 30 tablet, Refills: 0    HYDROcodone-acetaminophen (NORCO) 5-325 MG tablet Take 1 tablet by mouth every 6 (six) hours as needed for moderate pain. Qty: 40 tablet, Refills: 0    metoprolol (LOPRESSOR) 25 MG tablet Take 0.5 tablets (12.5 mg total) by mouth 2 (two) times daily. Qty: 30 tablet, Refills: 0      CONTINUE these  medications which have NOT CHANGED   Details  cyanocobalamin 500 MCG tablet Take 500 mcg by mouth daily.    divalproex (DEPAKOTE ER) 500 MG 24 hr tablet Take 750 mg by mouth at bedtime.     ferrous sulfate 325 (65 FE) MG tablet Take 325 mg by mouth daily with breakfast.    fluticasone (VERAMYST) 27.5 MCG/SPRAY nasal spray Place 1 spray into the nose 2 (two) times daily.    NITROSTAT 0.4 MG SL tablet DISSOLVE ONE TABLET UNDER THE TONGUE EVERY 5 MINUTES AS NEEDED FOR CHEST PAIN Qty: 25 tablet, Refills: 3    pantoprazole (PROTONIX) 20 MG tablet Take 1 tablet (20 mg total) by mouth daily. Qty: 30 tablet, Refills: 0    QUEtiapine (SEROQUEL) 25 MG tablet Take 25 mg by mouth at bedtime.     Red Yeast Rice 600 MG CAPS Take 1 capsule by mouth every morning.     sertraline (ZOLOFT) 100 MG tablet Take 100 mg by mouth daily.    Skin Protectants, Misc. (EUCERIN) cream Apply 1 application topically 2 (two) times daily.    docusate sodium (COLACE) 100 MG capsule Take 1 capsule (100 mg total) by mouth 2 (two) times daily. Qty: 10 capsule, Refills: 0      STOP taking these medications     loratadine (CLARITIN) 10 MG tablet      LORazepam (ATIVAN) 0.5 MG tablet      clopidogrel (PLAVIX) 75 MG tablet        Allergies  Allergen Reactions  . Aricept [Donepezil Hcl] Other (See Comments)    Altered mental status  . Ativan [Lorazepam] Other (See Comments)    Altered mental status  . Atorvastatin Nausea And Vomiting and Other (See Comments)    Joint pain  . Namenda [Memantine] Other (See Comments)    Altered mental status  . Other     Wife says pt was given 2 different medications for his nerves but said they made him have SI.  Marland Kitchen Simvastatin Nausea And Vomiting and Other (See Comments)    Joint pain  . Sorbitan Nausea And Vomiting    headache   Follow-up Information    Follow up with GRAVES,JOHN L, MD. Schedule an appointment as soon as possible for a visit in 2 weeks.   Specialty:   Orthopedic Surgery   Contact information:   [place] [address] [phone]       Follow up with [facility] .   Specialty:  [place] information:   [address] [place] [phone]       The results of significant diagnostics from this hospitalization (including imaging, microbiology, ancillary and laboratory) are listed below for reference.    Significant  Diagnostic Studies: Dg Chest Port 1 View  09/17/2015  CLINICAL DATA:  Status post fall, left hip fracture EXAM: PORTABLE CHEST 1 VIEW COMPARISON:  09/06/2015 FINDINGS: The heart size and mediastinal contours are within normal limits. Both lungs are clear. Possible right eighth lateral rib fracture. Mild osteoarthritis of bilateral glenohumeral joints. IMPRESSION: No active disease. Possible right eighth lateral rib fracture. Electronically Signed   By: Kathreen Devoid   On: 09/17/2015 13:22   Dg Chest Portable 1 View  09/06/2015  CLINICAL DATA:  Mid sternal chest pain for 1.5 hours, history hypertension, coronary artery disease post MI and coronary angioplasty, stroke, TIA, chronic kidney disease, Alzheimer's, former smoker EXAM: PORTABLE CHEST 1 VIEW COMPARISON:  Portable exam 1955 hours compared to 06/24/2015 FINDINGS: Upper normal heart size. Atherosclerotic calcification aorta. Mediastinal contours and pulmonary vascularity normal. Bibasilar atelectasis. Lungs otherwise clear. No pleural effusion or pneumothorax. Bones demineralized. IMPRESSION: Bibasilar atelectasis. Electronically Signed   By: Lavonia Dana M.D.   On: 09/06/2015 20:12   Dg Shoulder Left Port  09/20/2015  CLINICAL DATA:  Pain following fall EXAM: LEFT SHOULDER - 2 VIEW COMPARISON:  None. FINDINGS: Frontal and Y scapular images were obtained. There is no demonstrable acute fracture or dislocation. There is mild generalized osteoarthritic change with superior migration of the humeral  head on the left. No erosive change. Visualized left lung clear. IMPRESSION: Mild generalized osteoarthritic change. Apparent chronic rotator cuff tear on the left with superior migration of the left humeral head. No acute fracture or dislocation. Electronically Signed   By: Lowella Grip III M.D.   On: 09/20/2015 13:44   Dg C-arm 61-120 Min  09/18/2015  CLINICAL DATA:  Left hip surgery. EXAM: DG C-ARM 61-120 MIN; LEFT FEMUR 2 VIEWS COMPARISON:  Radiographs earlier this day. FINDINGS: Four fluoroscopic spot images from the operating room during left femur ORIF demonstrate intra medullary rod with distal and proximal trans trochanteric screws traversing left proximal femur fracture. Fluoroscopy time not provided. IMPRESSION: Intraoperative fluoroscopic spot images post intra medullary rod and screw fixation of proximal left femur fracture. Electronically Signed   By: Jeb Levering M.D.   On: 09/18/2015 01:41   Dg Femur Min 2 Views Left  09/18/2015  CLINICAL DATA:  Left hip surgery. EXAM: DG C-ARM 61-120 MIN; LEFT FEMUR 2 VIEWS COMPARISON:  Radiographs earlier this day. FINDINGS: Four fluoroscopic spot images from the operating room during left femur ORIF demonstrate intra medullary rod with distal and proximal trans trochanteric screws traversing left proximal femur fracture. Fluoroscopy time not provided. IMPRESSION: Intraoperative fluoroscopic spot images post intra medullary rod and screw fixation of proximal left femur fracture. Electronically Signed   By: Jeb Levering M.D.   On: 09/18/2015 01:41   Dg Hips Bilat With Pelvis Min 5 Views  09/17/2015  CLINICAL DATA:  FALL, BILATERAL HIP PAIN, Pt brought in by EMS from [facility]. Pt fell from bed.on Friday 09/16/15,Nursing home note reports pt complaining of pain while being turned to left side, BEST IMAGES OBTAINED DUE TO PATIENTS CONDITION, PATIENT HELD FOR SEVERAL IMAGES. HISTORY OF HTN, STROKE, CAD, TIA, ALZHEIMER'S, DEMENTIA, CKD,  INTRAMEDULLARY (IM) NAIL INTERTROCHANTERIC OF RIGHT ON 06/25/2015 EXAM: DG HIP (WITH OR WITHOUT PELVIS) 5+V BILAT COMPARISON:  06/25/2015 and previous FINDINGS: Sliding screw and IM rod transfix right intertrochanteric fracture in near anatomic alignment. The distal into the IM rod is not included. There is impacted midcervical fracture of the left femur. No dislocation. Bony pelvis intact. Bilateral pelvic phleboliths. IMPRESSION: 1.  Acute impacted midcervical left femur fracture. 2. Postop changes across the right femoral neck without acute abnormality. Electronically Signed   By: Lucrezia Europe M.D.   On: 09/17/2015 12:46    Microbiology: Recent Results (from the past 240 hour(s))  MRSA PCR Screening     Status: None   Collection Time: 09/18/15  8:49 AM  Result Value Ref Range Status   MRSA by PCR NEGATIVE NEGATIVE Final    Comment:        The GeneXpert MRSA Assay (FDA approved for NASAL specimens only), is one component of a comprehensive MRSA colonization surveillance program. It is not intended to diagnose MRSA infection nor to guide or monitor treatment for MRSA infections.   Urine culture     Status: Abnormal   Collection Time: 09/18/15  8:54 AM  Result Value Ref Range Status   Specimen Description URINE, CATHETERIZED  Final   Special Requests NONE  Final   Culture 50,000 COLONIES/mL ENTEROCOCCUS SPECIES (A)  Final   Report Status 09/20/2015 FINAL  Final   Organism ID, Bacteria ENTEROCOCCUS SPECIES (A)  Final      Susceptibility   Enterococcus species - MIC*    AMPICILLIN <=2 SENSITIVE Sensitive     LEVOFLOXACIN 1 SENSITIVE Sensitive     NITROFURANTOIN <=16 SENSITIVE Sensitive     VANCOMYCIN 2 SENSITIVE Sensitive     * 50,000 COLONIES/mL ENTEROCOCCUS SPECIES     Labs: Basic Metabolic Panel:  Recent Labs Lab 09/17/15 1308 09/18/15 09/18/15 0212  NA 139 140 140  K 4.2 4.1 4.4  CL 108 109 107  CO2 26 26 26   GLUCOSE 134* 130* 128*  BUN 32* 27* 26*  CREATININE 1.32*  1.35* 1.31*  CALCIUM 8.6* 8.3* 8.6*   Liver Function Tests: No results for input(s): AST, ALT, ALKPHOS, BILITOT, PROT, ALBUMIN in the last 168 hours. No results for input(s): LIPASE, AMYLASE in the last 168 hours. No results for input(s): AMMONIA in the last 168 hours. CBC:  Recent Labs Lab 09/17/15 1308 09/18/15 0212 09/19/15 0356 09/20/15 0333  WBC 14.4* 14.9* 10.2 10.5  NEUTROABS 12.2*  --   --   --   HGB 9.4* 8.8* 7.6* 9.9*  HCT 30.4* 29.7* 25.0* 31.8*  MCV 94.7 95.8 92.6 90.1  PLT 191 179 175 188   Cardiac Enzymes:  Recent Labs Lab 09/17/15 1308  TROPONINI <0.03   BNP: BNP (last 3 results) No results for input(s): BNP in the last 8760 hours.  ProBNP (last 3 results) No results for input(s): PROBNP in the last 8760 hours.  CBG:  Recent Labs Lab 09/19/15 1231 09/19/15 1650 09/19/15 2146 09/20/15 0633 09/20/15 1121  GLUCAP 193* 107* 107* 104* 116*    Signed:  Velvet Bathe MD.  [facility] 09/20/2015, 2:18 PM    UTI - Growing enterococcus sensitive to levaquin. Will d/c on levaquin for 7 days  Daughter has requested that patient's ativan be discontinued on discharge day.

## 2015-09-22 DIAGNOSIS — D62 Acute posthemorrhagic anemia: Secondary | ICD-10-CM | POA: Diagnosis not present

## 2015-09-22 DIAGNOSIS — E785 Hyperlipidemia, unspecified: Secondary | ICD-10-CM | POA: Diagnosis not present

## 2015-09-22 DIAGNOSIS — S72002D Fracture of unspecified part of neck of left femur, subsequent encounter for closed fracture with routine healing: Secondary | ICD-10-CM | POA: Diagnosis not present

## 2015-10-06 ENCOUNTER — Inpatient Hospital Stay (HOSPITAL_COMMUNITY)
Admission: EM | Admit: 2015-10-06 | Discharge: 2015-10-10 | DRG: 872 | Disposition: A | Discharge status: Skilled Nursing Facility | Payer: Medicare Other | Attending: Internal Medicine | Admitting: Internal Medicine

## 2015-10-06 ENCOUNTER — Emergency Department (HOSPITAL_COMMUNITY): Payer: Medicare Other

## 2015-10-06 ENCOUNTER — Encounter (HOSPITAL_COMMUNITY): Payer: Self-pay | Admitting: Emergency Medicine

## 2015-10-06 DIAGNOSIS — F0150 Vascular dementia without behavioral disturbance: Secondary | ICD-10-CM | POA: Diagnosis present

## 2015-10-06 DIAGNOSIS — I2510 Atherosclerotic heart disease of native coronary artery without angina pectoris: Secondary | ICD-10-CM | POA: Diagnosis present

## 2015-10-06 DIAGNOSIS — N179 Acute kidney failure, unspecified: Secondary | ICD-10-CM | POA: Diagnosis present

## 2015-10-06 DIAGNOSIS — Z809 Family history of malignant neoplasm, unspecified: Secondary | ICD-10-CM

## 2015-10-06 DIAGNOSIS — B9620 Unspecified Escherichia coli [E. coli] as the cause of diseases classified elsewhere: Secondary | ICD-10-CM | POA: Diagnosis present

## 2015-10-06 DIAGNOSIS — Z955 Presence of coronary angioplasty implant and graft: Secondary | ICD-10-CM | POA: Diagnosis not present

## 2015-10-06 DIAGNOSIS — I25811 Atherosclerosis of native coronary artery of transplanted heart without angina pectoris: Secondary | ICD-10-CM | POA: Diagnosis not present

## 2015-10-06 DIAGNOSIS — Z66 Do not resuscitate: Secondary | ICD-10-CM | POA: Diagnosis not present

## 2015-10-06 DIAGNOSIS — F0390 Unspecified dementia without behavioral disturbance: Secondary | ICD-10-CM | POA: Diagnosis present

## 2015-10-06 DIAGNOSIS — Z8249 Family history of ischemic heart disease and other diseases of the circulatory system: Secondary | ICD-10-CM | POA: Diagnosis not present

## 2015-10-06 DIAGNOSIS — D62 Acute posthemorrhagic anemia: Secondary | ICD-10-CM | POA: Diagnosis not present

## 2015-10-06 DIAGNOSIS — F0280 Dementia in other diseases classified elsewhere without behavioral disturbance: Secondary | ICD-10-CM | POA: Diagnosis present

## 2015-10-06 DIAGNOSIS — I252 Old myocardial infarction: Secondary | ICD-10-CM | POA: Diagnosis not present

## 2015-10-06 DIAGNOSIS — Z452 Encounter for adjustment and management of vascular access device: Secondary | ICD-10-CM | POA: Diagnosis not present

## 2015-10-06 DIAGNOSIS — N390 Urinary tract infection, site not specified: Secondary | ICD-10-CM

## 2015-10-06 DIAGNOSIS — F0391 Unspecified dementia with behavioral disturbance: Secondary | ICD-10-CM | POA: Diagnosis not present

## 2015-10-06 DIAGNOSIS — J189 Pneumonia, unspecified organism: Secondary | ICD-10-CM | POA: Diagnosis not present

## 2015-10-06 DIAGNOSIS — R7881 Bacteremia: Secondary | ICD-10-CM

## 2015-10-06 DIAGNOSIS — H3530 Unspecified macular degeneration: Secondary | ICD-10-CM | POA: Diagnosis present

## 2015-10-06 DIAGNOSIS — Z8673 Personal history of transient ischemic attack (TIA), and cerebral infarction without residual deficits: Secondary | ICD-10-CM

## 2015-10-06 DIAGNOSIS — G309 Alzheimer's disease, unspecified: Secondary | ICD-10-CM | POA: Diagnosis not present

## 2015-10-06 DIAGNOSIS — Z87891 Personal history of nicotine dependence: Secondary | ICD-10-CM

## 2015-10-06 DIAGNOSIS — R509 Fever, unspecified: Secondary | ICD-10-CM

## 2015-10-06 DIAGNOSIS — S72002D Fracture of unspecified part of neck of left femur, subsequent encounter for closed fracture with routine healing: Secondary | ICD-10-CM | POA: Diagnosis not present

## 2015-10-06 DIAGNOSIS — N289 Disorder of kidney and ureter, unspecified: Secondary | ICD-10-CM

## 2015-10-06 DIAGNOSIS — A4151 Sepsis due to Escherichia coli [E. coli]: Principal | ICD-10-CM | POA: Diagnosis present

## 2015-10-06 DIAGNOSIS — Z79891 Long term (current) use of opiate analgesic: Secondary | ICD-10-CM

## 2015-10-06 DIAGNOSIS — I129 Hypertensive chronic kidney disease with stage 1 through stage 4 chronic kidney disease, or unspecified chronic kidney disease: Secondary | ICD-10-CM | POA: Diagnosis not present

## 2015-10-06 DIAGNOSIS — S72002A Fracture of unspecified part of neck of left femur, initial encounter for closed fracture: Secondary | ICD-10-CM

## 2015-10-06 DIAGNOSIS — Z7951 Long term (current) use of inhaled steroids: Secondary | ICD-10-CM

## 2015-10-06 DIAGNOSIS — N183 Chronic kidney disease, stage 3 unspecified: Secondary | ICD-10-CM | POA: Diagnosis present

## 2015-10-06 DIAGNOSIS — S72009A Fracture of unspecified part of neck of unspecified femur, initial encounter for closed fracture: Secondary | ICD-10-CM | POA: Diagnosis present

## 2015-10-06 DIAGNOSIS — J811 Chronic pulmonary edema: Secondary | ICD-10-CM | POA: Diagnosis not present

## 2015-10-06 DIAGNOSIS — M6281 Muscle weakness (generalized): Secondary | ICD-10-CM | POA: Diagnosis not present

## 2015-10-06 DIAGNOSIS — I10 Essential (primary) hypertension: Secondary | ICD-10-CM

## 2015-10-06 DIAGNOSIS — A419 Sepsis, unspecified organism: Secondary | ICD-10-CM

## 2015-10-06 DIAGNOSIS — Z7982 Long term (current) use of aspirin: Secondary | ICD-10-CM

## 2015-10-06 DIAGNOSIS — E785 Hyperlipidemia, unspecified: Secondary | ICD-10-CM | POA: Diagnosis present

## 2015-10-06 DIAGNOSIS — R4182 Altered mental status, unspecified: Secondary | ICD-10-CM | POA: Diagnosis not present

## 2015-10-06 DIAGNOSIS — I672 Cerebral atherosclerosis: Secondary | ICD-10-CM | POA: Diagnosis not present

## 2015-10-06 DIAGNOSIS — F4489 Other dissociative and conversion disorders: Secondary | ICD-10-CM | POA: Diagnosis not present

## 2015-10-06 DIAGNOSIS — R404 Transient alteration of awareness: Secondary | ICD-10-CM | POA: Diagnosis not present

## 2015-10-06 DIAGNOSIS — Z7401 Bed confinement status: Secondary | ICD-10-CM | POA: Diagnosis not present

## 2015-10-06 DIAGNOSIS — R278 Other lack of coordination: Secondary | ICD-10-CM | POA: Diagnosis not present

## 2015-10-06 DIAGNOSIS — R1312 Dysphagia, oropharyngeal phase: Secondary | ICD-10-CM | POA: Diagnosis not present

## 2015-10-06 DIAGNOSIS — R1310 Dysphagia, unspecified: Secondary | ICD-10-CM | POA: Diagnosis not present

## 2015-10-06 DIAGNOSIS — I200 Unstable angina: Secondary | ICD-10-CM | POA: Diagnosis not present

## 2015-10-06 LAB — COMPREHENSIVE METABOLIC PANEL
ALBUMIN: 2.2 g/dL — AB (ref 3.5–5.0)
ALT: 7 U/L — AB (ref 17–63)
AST: 10 U/L — AB (ref 15–41)
Alkaline Phosphatase: 128 U/L — ABNORMAL HIGH (ref 38–126)
Anion gap: 4 — ABNORMAL LOW (ref 5–15)
BUN: 45 mg/dL — AB (ref 6–20)
CHLORIDE: 106 mmol/L (ref 101–111)
CO2: 24 mmol/L (ref 22–32)
CREATININE: 1.74 mg/dL — AB (ref 0.61–1.24)
Calcium: 7.3 mg/dL — ABNORMAL LOW (ref 8.9–10.3)
GFR calc Af Amer: 40 mL/min — ABNORMAL LOW (ref >60)
GFR calc non Af Amer: 34 mL/min — ABNORMAL LOW (ref >60)
Glucose, Bld: 105 mg/dL — ABNORMAL HIGH (ref 65–99)
Potassium: 3.7 mmol/L (ref 3.5–5.1)
SODIUM: 134 mmol/L — AB (ref 135–145)
Total Bilirubin: 0.5 mg/dL (ref 0.3–1.2)
Total Protein: 5.2 g/dL — ABNORMAL LOW (ref 6.5–8.1)

## 2015-10-06 LAB — CBC WITH DIFFERENTIAL/PLATELET
BASOS ABS: 0 10*3/uL (ref 0.0–0.1)
BASOS PCT: 0 %
Eosinophils Absolute: 0 10*3/uL (ref 0.0–0.7)
Eosinophils Relative: 0 %
HEMATOCRIT: 30.4 % — AB (ref 39.0–52.0)
HEMOGLOBIN: 10.1 g/dL — AB (ref 13.0–17.0)
LYMPHS ABS: 0.7 10*3/uL (ref 0.7–4.0)
LYMPHS PCT: 3 %
MCH: 29.2 pg (ref 26.0–34.0)
MCHC: 33.2 g/dL (ref 30.0–36.0)
MCV: 87.9 fL (ref 78.0–100.0)
MONOS PCT: 5 %
Monocytes Absolute: 1 10*3/uL (ref 0.1–1.0)
Neutro Abs: 19.6 10*3/uL — ABNORMAL HIGH (ref 1.7–7.7)
Neutrophils Relative %: 92 %
Platelets: 274 10*3/uL (ref 150–400)
RBC: 3.46 MIL/uL — ABNORMAL LOW (ref 4.22–5.81)
RDW: 15.2 % (ref 11.5–15.5)
WBC: 21.3 10*3/uL — ABNORMAL HIGH (ref 4.0–10.5)

## 2015-10-06 LAB — URINALYSIS, ROUTINE W REFLEX MICROSCOPIC
Bilirubin Urine: NEGATIVE
GLUCOSE, UA: NEGATIVE mg/dL
KETONES UR: NEGATIVE mg/dL
NITRITE: POSITIVE — AB
PH: 5.5 (ref 5.0–8.0)
Protein, ur: 100 mg/dL — AB
SPECIFIC GRAVITY, URINE: 1.015 (ref 1.005–1.030)

## 2015-10-06 LAB — URINE MICROSCOPIC-ADD ON

## 2015-10-06 LAB — TROPONIN I: Troponin I: 0.03 ng/mL (ref <0.031)

## 2015-10-06 LAB — I-STAT CG4 LACTIC ACID, ED: LACTIC ACID, VENOUS: 1.47 mmol/L (ref 0.5–2.0)

## 2015-10-06 MED ORDER — VANCOMYCIN HCL IN DEXTROSE 1-5 GM/200ML-% IV SOLN
1000.0000 mg | Freq: Once | INTRAVENOUS | Status: AC
  Administered 2015-10-06: 1000 mg via INTRAVENOUS
  Filled 2015-10-06: qty 200

## 2015-10-06 MED ORDER — PIPERACILLIN-TAZOBACTAM 3.375 G IVPB
3.3750 g | Freq: Three times a day (TID) | INTRAVENOUS | Status: DC
  Administered 2015-10-07 – 2015-10-08 (×5): 3.375 g via INTRAVENOUS
  Filled 2015-10-06 (×4): qty 50

## 2015-10-06 MED ORDER — ACETAMINOPHEN 650 MG RE SUPP
650.0000 mg | Freq: Once | RECTAL | Status: AC
  Administered 2015-10-06: 650 mg via RECTAL
  Filled 2015-10-06: qty 1

## 2015-10-06 MED ORDER — SODIUM CHLORIDE 0.9 % IV BOLUS (SEPSIS)
1000.0000 mL | Freq: Once | INTRAVENOUS | Status: AC
  Administered 2015-10-06: 1000 mL via INTRAVENOUS

## 2015-10-06 MED ORDER — PIPERACILLIN-TAZOBACTAM 3.375 G IVPB 30 MIN
3.3750 g | Freq: Once | INTRAVENOUS | Status: AC
  Administered 2015-10-06: 3.375 g via INTRAVENOUS
  Filled 2015-10-06: qty 50

## 2015-10-06 MED ORDER — VANCOMYCIN HCL 10 G IV SOLR
1250.0000 mg | INTRAVENOUS | Status: DC
  Administered 2015-10-07: 1250 mg via INTRAVENOUS
  Filled 2015-10-06 (×3): qty 1250

## 2015-10-06 MED ORDER — ACETAMINOPHEN 325 MG PO TABS
650.0000 mg | ORAL_TABLET | Freq: Four times a day (QID) | ORAL | Status: DC | PRN
  Administered 2015-10-06 – 2015-10-09 (×2): 650 mg via ORAL
  Filled 2015-10-06 (×2): qty 2

## 2015-10-06 NOTE — H&P (Signed)
History and Physical  David Collier [id number] DOB: 1929-02-04 DOA: 10/06/2015  Referring physician: Dr. Thurnell Garbe, ED physician PCP: Renata Caprice, DO  Outpatient Specialists:   Dr. Berenice Primas (orthopedics)  Chief Complaint: Altered mental status  HPI: David Collier is a 82 y.o. male with a history of coronary artery disease with stenting, stage III chronic kidney disease, vascular dementia, macular degeneration, history of myocardial infarction, status post stroke, headache.  Patient was sent here from [facility] home due to concerns of sepsis the patient extremely confused and initially unresponsive. His symptoms improved after fluid resuscitation and measures in the emergency department. The history is extremely limited due to the dementia and altered mental status. Patient has no current complaints.  Of note, the patient did have a left hip ORIF after a basicervical femoral neck fracture. This was done on 09/18/15.  Review of Systems:  Unable to obtain due to dementia and altered mental status  Past Medical History  Diagnosis Date  . Hyperlipidemia   . Coronary atherosclerosis of native coronary artery     BMS circumflex and OM 2006, PTCA ISR 2010, moderate LAD disease  . DJD (degenerative joint disease)   . Macular degeneration   . Alzheimer's dementia   . CKD (chronic kidney disease), stage III   . History of stroke     Seen radiographically-left PICA; unknown to patient.  Marland Kitchen TIA (transient ischemic attack) 03/05/2014  . 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 CAD (coronary artery disease), native coronary artery     Initially diagnosed 2003 2006 catheterization with bare metal stenting of the first marginal branch of the circumflex and continuation branch of the circumflex 2010 catheterization showing normal left main, 50-60% LAD unchanged from before, proximal stent of marginal patent, severe in-stent restenosis treated with cutting balloon angioplasty,  patent stent in continuation branch with mild in-stent restenosis, occluded nondominant right coronary artery.   . Myocardial infarction ([place])     x5    1980s  sees DR. MICHAEL COOPER  . Anginal pain (HCC)     NONE IN 1 MONTH   . Hypertension   . Stroke ([place])   . Depression   . Headache   . Seizures [facility])    Past Surgical History  Procedure Laterality Date  . Appendectomy    . Cardiac catheterization      3 STENTS  AT  [place]  80S OR 90S  . Coronary angioplasty    . Cholecystectomy    . Multiple extractions with alveoloplasty N/A 10/22/2014    Procedure: MULTIPLE EXTRACTIONS  # 4,6, 7, 8, 10, 21, 22, 23, 24, 25, 26, 27, 28 and ALVEOLOPLASTY;  Surgeon: Diona Browner, DDS;  Location: [place];  Service: Oral Surgery;  Laterality: N/A;  . Intramedullary (im) nail intertrochanteric Right 06/25/2015    Procedure: INTRAMEDULLARY (IM) NAIL INTERTROCHANTRIC;  Surgeon: Dorna Leitz, MD;  Location: [place];  Service: Orthopedics;  Laterality: Right;  . Femur im nail Left 09/17/2015    Procedure: INTRAMEDULLARY (IM) NAIL FEMORAL;  Surgeon: Dorna Leitz, MD;  Location: [place];  Service: Orthopedics;  Laterality: Left;   Social History:  reports that he quit smoking about 49 years ago. His smoking use included Cigarettes. He has never used smokeless tobacco. He reports that he does not drink alcohol or use illicit drugs. Patient lives at [facility]  Allergies  Allergen Reactions  . Aricept [Donepezil Hcl] Other (See Comments)    Altered mental status  . Ativan [Lorazepam] Other (  See Comments)    Altered mental status  . Atorvastatin Nausea And Vomiting and Other (See Comments)    Joint pain  . Namenda [Memantine] Other (See Comments)    Altered mental status  . Other     Wife says pt was given 2 different medications for his nerves but said they made him have SI.  Marland Kitchen Simvastatin Nausea And Vomiting and Other (See Comments)    Joint pain  . Sorbitan Nausea And Vomiting    headache     Family History  Problem Relation Age of Onset  . Heart failure    . Heart failure Father   . Heart attack Father   . Heart failure Mother   . CAD Brother   . CAD Brother   . Cancer Brother   . Cancer Brother   . Hypertension Sister   . Heart attack Sister      Prior to Admission medications   Medication Sig Start Date End Date Taking? Authorizing Provider  acetaminophen (TYLENOL) 325 MG tablet Take 2 tablets (650 mg total) by mouth every 6 (six) hours as needed for mild pain (or Fever >/= 101). 09/20/15  Yes Velvet Bathe, MD  aspirin EC 325 MG EC tablet Take 1 tablet (325 mg total) by mouth daily with breakfast. 09/20/15  Yes Velvet Bathe, MD  cyanocobalamin 500 MCG tablet Take 500 mcg by mouth daily.   Yes Historical Provider, MD  divalproex (DEPAKOTE ER) 500 MG 24 hr tablet Take 750 mg by mouth at bedtime.  06/11/15  Yes Historical Provider, MD  docusate sodium (COLACE) 100 MG capsule Take 1 capsule (100 mg total) by mouth 2 (two) times daily. 06/27/15  Yes Thurnell Lose, MD  ferrous sulfate 325 (65 FE) MG tablet Take 325 mg by mouth daily with breakfast.   Yes Historical Provider, MD  fluticasone (VERAMYST) 27.5 MCG/SPRAY nasal spray Place 1 spray into the nose 2 (two) times daily.   Yes Historical Provider, MD  HYDROcodone-acetaminophen (NORCO) 5-325 MG tablet Take 1 tablet by mouth every 6 (six) hours as needed for moderate pain. 09/17/15  Yes Gary Fleet, PA-C  metoprolol (LOPRESSOR) 25 MG tablet Take 0.5 tablets (12.5 mg total) by mouth 2 (two) times daily. 09/20/15  Yes Velvet Bathe, MD  pantoprazole (PROTONIX) 20 MG tablet Take 1 tablet (20 mg total) by mouth daily. 09/06/15  Yes Daleen Bo, MD  QUEtiapine (SEROQUEL) 25 MG tablet Take 25 mg by mouth at bedtime.  06/10/15  Yes Historical Provider, MD  sertraline (ZOLOFT) 100 MG tablet Take 100 mg by mouth daily.   Yes Historical Provider, MD  feeding supplement, ENSURE ENLIVE, (ENSURE ENLIVE) LIQD Take 237 mLs by mouth 2  (two) times daily between meals. 09/20/15   Velvet Bathe, MD  NITROSTAT 0.4 MG SL tablet DISSOLVE ONE TABLET UNDER THE TONGUE EVERY 5 MINUTES AS NEEDED FOR CHEST PAIN 04/12/14   [name], MD  Skin Protectants, Misc. (EUCERIN) cream Apply 1 application topically 2 (two) times daily.    Historical Provider, MD    Physical Exam: BP 90/60 mmHg  Pulse 91  Temp(Src) 98.7 F (37.1 C) (Oral)  Resp 21  Wt 89.812 kg (198 lb)  SpO2 96%  General: Elderly Caucasian male. Awake and alert, although completely not oriented. No acute cardiopulmonary distress.  HEENT: Normocephalic atraumatic.  Right and left ears normal in appearance.  Pupils equal, round, reactive to light. Extraocular muscles are intact. Sclerae anicteric and noninjected.  Moist mucosal membranes. No mucosal  lesions.  Neck: Neck supple without lymphadenopathy. No carotid bruits. No masses palpated.  Cardiovascular: Regular rate with normal S1-S2 sounds. No murmurs, rubs, gallops auscultated. No JVD.  Respiratory: Good respiratory effort with no wheezes, rales, rhonchi. Lungs clear to auscultation bilaterally.  No accessory muscle use. Abdomen: Soft, nontender, nondistended. Active bowel sounds. No masses or hepatosplenomegaly  Skin: No rashes, lesions, or ulcerations.  Dry, warm to touch. 2+ dorsalis pedis and radial pulses. There is slight redness over the edge of the incision for his ORIF on the left side. This appears to be more irritation than cellulitis. Musculoskeletal: No calf or leg pain. All major joints not erythematous nontender.  No upper or lower joint deformation.  Good ROM.  No contractures  Psychiatric: Intact judgment and insight. Pleasant and cooperative. Neurologic: No focal neurological deficits, although patient is unable to follow instructions.  He is able to sit himself up in bed. Cranial nerves II through XII are grossly intact.           Labs on Admission: I have personally reviewed following labs and  imaging studies  CBC:  Recent Labs Lab 10/06/15 1808  WBC 21.3*  NEUTROABS 19.6*  HGB 10.1*  HCT 30.4*  MCV 87.9  PLT 123456   Basic Metabolic Panel:  Recent Labs Lab 10/06/15 1854  NA 134*  K 3.7  CL 106  CO2 24  GLUCOSE 105*  BUN 45*  CREATININE 1.74*  CALCIUM 7.3*   GFR: Estimated Creatinine Clearance: 35 mL/min (by C-G formula based on Cr of 1.74). Liver Function Tests:  Recent Labs Lab 10/06/15 1854  AST 10*  ALT 7*  ALKPHOS 128*  BILITOT 0.5  PROT 5.2*  ALBUMIN 2.2*   No results for input(s): LIPASE, AMYLASE in the last 168 hours. No results for input(s): AMMONIA in the last 168 hours. Coagulation Profile: No results for input(s): INR, PROTIME in the last 168 hours. Cardiac Enzymes:  Recent Labs Lab 10/06/15 1854  TROPONINI 0.03   BNP (last 3 results) No results for input(s): PROBNP in the last 8760 hours. HbA1C: No results for input(s): HGBA1C in the last 72 hours. CBG: No results for input(s): GLUCAP in the last 168 hours. Lipid Profile: No results for input(s): CHOL, HDL, LDLCALC, TRIG, CHOLHDL, LDLDIRECT in the last 72 hours. Thyroid Function Tests: No results for input(s): TSH, T4TOTAL, FREET4, T3FREE, THYROIDAB in the last 72 hours. Anemia Panel: No results for input(s): VITAMINB12, FOLATE, FERRITIN, TIBC, IRON, RETICCTPCT in the last 72 hours. Urine analysis:    Component Value Date/Time   COLORURINE YELLOW 10/06/2015 1825   APPEARANCEUR HAZY* 10/06/2015 1825   LABSPEC 1.015 10/06/2015 1825   PHURINE 5.5 10/06/2015 1825   GLUCOSEU NEGATIVE 10/06/2015 1825   HGBUR LARGE* 10/06/2015 1825   BILIRUBINUR NEGATIVE 10/06/2015 1825   KETONESUR NEGATIVE 10/06/2015 1825   PROTEINUR 100* 10/06/2015 1825   UROBILINOGEN 0.2 03/04/2015 1840   NITRITE POSITIVE* 10/06/2015 1825   LEUKOCYTESUR LARGE* 10/06/2015 1825   Sepsis Labs: @LABRCNTIP (procalcitonin:4,lacticidven:4) ) Recent Results (from the past 240 hour(s))  Blood Culture (routine  x 2)     Status: None (Preliminary result)   Collection Time: 10/06/15  6:47 PM  Result Value Ref Range Status   Specimen Description BLOOD RIGHT ARM  Final   Special Requests BOTTLES DRAWN AEROBIC AND ANAEROBIC 6CC  Final   Culture PENDING  Incomplete   Report Status PENDING  Incomplete  Blood Culture (routine x 2)     Status: None (Preliminary result)  Collection Time: 10/06/15  6:54 PM  Result Value Ref Range Status   Specimen Description BLOOD LEFT HAND  Final   Special Requests BOTTLES DRAWN AEROBIC AND ANAEROBIC [facility]  Final   Culture PENDING  Incomplete   Report Status PENDING  Incomplete     Radiological Exams on Admission: Dg Chest Port 1 View  10/06/2015  CLINICAL DATA:  Acute onset of altered mental status. Code sepsis. Fever and lethargy. Initial encounter. EXAM: PORTABLE CHEST 1 VIEW COMPARISON:  Chest radiograph performed 09/17/2015 FINDINGS: The lungs are well-aerated. Mild vascular congestion is noted, with mild left basilar opacity possibly reflecting atelectasis or mild pneumonia. There is no evidence of pleural effusion or pneumothorax. The cardiomediastinal silhouette is borderline normal in size. No acute osseous abnormalities are seen. IMPRESSION: Mild vascular congestion noted. Mild left basilar opacity may reflect atelectasis or mild pneumonia. Electronically Signed   By: Garald Balding M.D.   On: 10/06/2015 18:34    Assessment/Plan: Principal Problem:   Sepsis ([place]) Active Problems:   Essential hypertension   CAD (coronary artery disease), native coronary artery   Dementia   Hip fracture (HCC)   UTI (lower urinary tract infection)   Acute renal failure superimposed on stage 3 chronic kidney disease ([place])    This patient was discussed with the ED physician, including pertinent vitals, physical exam findings, labs, and imaging.  We also discussed care given by the ED provider.  #1 sepsis  Admit to stepdown  Blood pressures continue to be a little soft -  patient fluid resuscitated  Hold antihypertensive  Continue broad-spectrum antibiotics  Source appears to be UTI, although possible left base pneumonia on chest x-ray  Blood cultures and urine culture sent  Lactic acid level normal #2 UTI  Urine culture  Continue vancomycin and Zosyn #3 hip fracture  We'll continue to keep an eye on the skin incision #4  acute renal failure superimposed on chronic kidney disease stage III  Recheck creatinine in the morning after fluid resuscitation #5 essential hypertension.  Hold beta blocker #6 coronary artery disease  Continue aspirin #7 dementia  Continue mood stabilizers  Will reorient as frequently as possible  DVT prophylaxis: Lovenox Consultants: None Code Status: DO NOT RESUSCITATE Family Communication: Daughters in the room - his daughter Glenard Haring is the medical power of attorney  Disposition Plan: Admit   Truett Mainland, DO [facility] Pager [phone]  If 7PM-7AM, please contact night-coverage [email or web address] Password [email or web address]

## 2015-10-06 NOTE — ED Provider Notes (Signed)
CSN: [id number]     Arrival date & time 10/06/15  1743 History   First MD Initiated Contact with Patient 10/06/15 1757     Chief Complaint  Patient presents with  . Altered Mental Status      The history is limited by the condition of the patient (Hx dementia).  Pt was seen at 1755. Per EMS and NH report:  Pt sent to ED for "concern for sepsis." Pt has significant hx of dementia.   Past Medical History  Diagnosis Date  . Hyperlipidemia   . Coronary atherosclerosis of native coronary artery     BMS circumflex and OM 2006, PTCA ISR 2010, moderate LAD disease  . DJD (degenerative joint disease)   . Macular degeneration   . Alzheimer's dementia   . CKD (chronic kidney disease), stage III   . History of stroke     Seen radiographically-left PICA; unknown to patient.  Marland Kitchen TIA (transient ischemic attack) 03/05/2014  . 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 CAD (coronary artery disease), native coronary artery     Initially diagnosed 2003 2006 catheterization with bare metal stenting of the first marginal branch of the circumflex and continuation branch of the circumflex 2010 catheterization showing normal left main, 50-60% LAD unchanged from before, proximal stent of marginal patent, severe in-stent restenosis treated with cutting balloon angioplasty, patent stent in continuation branch with mild in-stent restenosis, occluded nondominant right coronary artery.   . Myocardial infarction ([place])     x5    1980s  sees DR. MICHAEL COOPER  . Anginal pain (HCC)     NONE IN 1 MONTH   . Hypertension   . Stroke ([place])   . Depression   . Headache   . Seizures [facility])    Past Surgical History  Procedure Laterality Date  . Appendectomy    . Cardiac catheterization      3 STENTS  AT  [place]  80S OR 90S  . Coronary angioplasty    . Cholecystectomy    . Multiple extractions with alveoloplasty N/A 10/22/2014    Procedure: MULTIPLE EXTRACTIONS  # 4,6, 7, 8, 10, 21, 22, 23, 24, 25,  26, 27, 28 and ALVEOLOPLASTY;  Surgeon: Diona Browner, DDS;  Location: [place];  Service: Oral Surgery;  Laterality: N/A;  . Intramedullary (im) nail intertrochanteric Right 06/25/2015    Procedure: INTRAMEDULLARY (IM) NAIL INTERTROCHANTRIC;  Surgeon: Dorna Leitz, MD;  Location: [place];  Service: Orthopedics;  Laterality: Right;  . Femur im nail Left 09/17/2015    Procedure: INTRAMEDULLARY (IM) NAIL FEMORAL;  Surgeon: Dorna Leitz, MD;  Location: [place];  Service: Orthopedics;  Laterality: Left;   Family History  Problem Relation Age of Onset  . Heart failure     Social History  Substance Use Topics  . Smoking status: Former Smoker    Types: Cigarettes    Quit date: 04/23/1966  . Smokeless tobacco: Never Used  . Alcohol Use: No    Review of Systems  Unable to perform ROS: Dementia     Allergies  Aricept; Ativan; Atorvastatin; Namenda; Other; Simvastatin; and Sorbitan  Home Medications   Prior to Admission medications   Medication Sig Start Date End Date Taking? Authorizing Provider  acetaminophen (TYLENOL) 325 MG tablet Take 2 tablets (650 mg total) by mouth every 6 (six) hours as needed for mild pain (or Fever >/= 101). 09/20/15   Velvet Bathe, MD  aspirin EC 325 MG EC tablet Take 1 tablet (325 mg total) by mouth  daily with breakfast. 09/20/15   Velvet Bathe, MD  cyanocobalamin 500 MCG tablet Take 500 mcg by mouth daily.    Historical Provider, MD  divalproex (DEPAKOTE ER) 500 MG 24 hr tablet Take 750 mg by mouth at bedtime.  06/11/15   Historical Provider, MD  docusate sodium (COLACE) 100 MG capsule Take 1 capsule (100 mg total) by mouth 2 (two) times daily. Patient not taking: Reported on 09/06/2015 06/27/15   Thurnell Lose, MD  feeding supplement, ENSURE ENLIVE, (ENSURE ENLIVE) LIQD Take 237 mLs by mouth 2 (two) times daily between meals. 09/20/15   Velvet Bathe, MD  ferrous sulfate 325 (65 FE) MG tablet Take 325 mg by mouth daily with breakfast.    Historical Provider, MD  fluticasone  (VERAMYST) 27.5 MCG/SPRAY nasal spray Place 1 spray into the nose 2 (two) times daily.    Historical Provider, MD  HYDROcodone-acetaminophen (NORCO) 5-325 MG tablet Take 1 tablet by mouth every 6 (six) hours as needed for moderate pain. 09/17/15   Gary Fleet, PA-C  levofloxacin (LEVAQUIN) 500 MG tablet Take 1 tablet (500 mg total) by mouth daily. 09/20/15   Velvet Bathe, MD  metoprolol (LOPRESSOR) 25 MG tablet Take 0.5 tablets (12.5 mg total) by mouth 2 (two) times daily. 09/20/15   Velvet Bathe, MD  NITROSTAT 0.4 MG SL tablet DISSOLVE ONE TABLET UNDER THE TONGUE EVERY 5 MINUTES AS NEEDED FOR CHEST PAIN 04/12/14   [name], MD  pantoprazole (PROTONIX) 20 MG tablet Take 1 tablet (20 mg total) by mouth daily. 09/06/15   Daleen Bo, MD  QUEtiapine (SEROQUEL) 25 MG tablet Take 25 mg by mouth at bedtime.  06/10/15   Historical Provider, MD  Red Yeast Rice 600 MG CAPS Take 1 capsule by mouth every morning.     Historical Provider, MD  sertraline (ZOLOFT) 100 MG tablet Take 100 mg by mouth daily.    Historical Provider, MD  Skin Protectants, Misc. (EUCERIN) cream Apply 1 application topically 2 (two) times daily.    Historical Provider, MD   BP 118/93 mmHg  Pulse 101  Temp(Src) 101 F (38.3 C) (Oral)  Resp 22  Wt 198 lb (89.812 kg)  SpO2 95%   Patient Vitals for the past 24 hrs:  BP Temp Temp src Pulse Resp SpO2 Weight  10/06/15 1957 - 98.7 F (37.1 C) Oral - - - -  10/06/15 1941 (!) 98/54 mmHg - - - 18 - -  10/06/15 1930 (!) 90/51 mmHg - - 93 18 90 % -  10/06/15 1900 105/55 mmHg - - 97 26 97 % -  10/06/15 1830 (!) 101/54 mmHg - - 98 20 92 % -  10/06/15 1750 118/93 mmHg 101 F (38.3 C) Oral 101 22 95 % 198 lb (89.812 kg)      Physical Exam  1800: Physical examination:  Nursing notes reviewed; Vital signs and O2 SAT reviewed; +febrile.;;  Constitutional: Well developed, Well nourished, In no acute distress; Head:  Normocephalic, atraumatic; Eyes: EOMI, PERRL, No scleral icterus;  ENMT: Mouth and pharynx normal, Mucous membranes dry; Neck: Supple, Full range of motion, No lymphadenopathy; Cardiovascular: Tachycardic rate and rhythm, No gallop; Respiratory: Breath sounds coarse & equal bilaterally, No wheezes. Normal respiratory effort/excursion; Chest: Nontender, Movement normal; Abdomen: Soft, Nontender, Nondistended, Normal bowel sounds; Genitourinary: No CVA tenderness; Extremities: Pulses normal, No tenderness, No edema, No calf edema or asymmetry.; Neuro: Awake, alert, confused per hx dementia. No facial droop. Speech mumbling. Moves all extremities spontaneously without apparent gross focal  motor deficits.; Skin: Color normal, Warm, Dry.   ED Course  Procedures (including critical care time) Labs Review   Imaging Review  I have personally reviewed and evaluated these images and lab results as part of my medical decision-making.   EKG Interpretation None      MDM  MDM Reviewed: previous chart, nursing note and vitals Reviewed previous: labs and ECG Interpretation: labs, ECG and x-ray Total time providing critical care: 30-74 minutes. This excludes time spent performing separately reportable procedures and services. Consults: admitting MD     CRITICAL CARE Performed by: Alfonzo Feller Total critical care time: 35 minutes Critical care time was exclusive of separately billable procedures and treating other patients. Critical care was necessary to treat or prevent imminent or life-threatening deterioration. Critical care was time spent personally by me on the following activities: development of treatment plan with patient and/or surrogate as well as nursing, discussions with consultants, evaluation of patient's response to treatment, examination of patient, obtaining history from patient or surrogate, ordering and performing treatments and interventions, ordering and review of laboratory studies, ordering and review of radiographic studies, pulse  oximetry and re-evaluation of patient's condition.   ED ECG REPORT   Date: 10/06/2015  Rate: 100  Rhythm: sinus tachycardia  QRS Axis: left  Intervals: normal  ST/T Wave abnormalities: normal  Conduction Disutrbances:right bundle branch block  Narrative Interpretation:   Old EKG Reviewed: unchanged; no significant changes from previous EKG dated 09/17/15.   Results for orders placed or performed during the hospital encounter of 10/06/15  Comprehensive metabolic panel  Result Value Ref Range   Sodium 134 (L) 135 - 145 mmol/L   Potassium 3.7 3.5 - 5.1 mmol/L   Chloride 106 101 - 111 mmol/L   CO2 24 22 - 32 mmol/L   Glucose, Bld 105 (H) 65 - 99 mg/dL   BUN 45 (H) 6 - 20 mg/dL   Creatinine, Ser 1.74 (H) 0.61 - 1.24 mg/dL   Calcium 7.3 (L) 8.9 - 10.3 mg/dL   Total Protein 5.2 (L) 6.5 - 8.1 g/dL   Albumin 2.2 (L) 3.5 - 5.0 g/dL   AST 10 (L) 15 - 41 U/L   ALT 7 (L) 17 - 63 U/L   Alkaline Phosphatase 128 (H) 38 - 126 U/L   Total Bilirubin 0.5 0.3 - 1.2 mg/dL   GFR calc non Af Amer 34 (L) >60 mL/min   GFR calc Af Amer 40 (L) >60 mL/min   Anion gap 4 (L) 5 - 15  CBC WITH DIFFERENTIAL  Result Value Ref Range   WBC 21.3 (H) 4.0 - 10.5 K/uL   RBC 3.46 (L) 4.22 - 5.81 MIL/uL   Hemoglobin 10.1 (L) 13.0 - 17.0 g/dL   HCT 30.4 (L) 39.0 - 52.0 %   MCV 87.9 78.0 - 100.0 fL   MCH 29.2 26.0 - 34.0 pg   MCHC 33.2 30.0 - 36.0 g/dL   RDW 15.2 11.5 - 15.5 %   Platelets 274 150 - 400 K/uL   Neutrophils Relative % 92 %   Neutro Abs 19.6 (H) 1.7 - 7.7 K/uL   Lymphocytes Relative 3 %   Lymphs Abs 0.7 0.7 - 4.0 K/uL   Monocytes Relative 5 %   Monocytes Absolute 1.0 0.1 - 1.0 K/uL   Eosinophils Relative 0 %   Eosinophils Absolute 0.0 0.0 - 0.7 K/uL   Basophils Relative 0 %   Basophils Absolute 0.0 0.0 - 0.1 K/uL  Urinalysis, Routine w reflex microscopic (not  at [facility])  Result Value Ref Range   Color, Urine YELLOW YELLOW   APPearance HAZY (A) CLEAR   Specific Gravity, Urine 1.015 1.005 -  1.030   pH 5.5 5.0 - 8.0   Glucose, UA NEGATIVE NEGATIVE mg/dL   Hgb urine dipstick LARGE (A) NEGATIVE   Bilirubin Urine NEGATIVE NEGATIVE   Ketones, ur NEGATIVE NEGATIVE mg/dL   Protein, ur 100 (A) NEGATIVE mg/dL   Nitrite POSITIVE (A) NEGATIVE   Leukocytes, UA LARGE (A) NEGATIVE  Troponin I  Result Value Ref Range   Troponin I 0.03 <0.031 ng/mL  Urine microscopic-add on  Result Value Ref Range   Squamous Epithelial / LPF 0-5 (A) NONE SEEN   WBC, UA TOO NUMEROUS TO COUNT 0 - 5 WBC/hpf   RBC / HPF 6-30 0 - 5 RBC/hpf   Bacteria, UA MANY (A) NONE SEEN   Dg Chest Port 1 View 10/06/2015  CLINICAL DATA:  Acute onset of altered mental status. Code sepsis. Fever and lethargy. Initial encounter. EXAM: PORTABLE CHEST 1 VIEW COMPARISON:  Chest radiograph performed 09/17/2015 FINDINGS: The lungs are well-aerated. Mild vascular congestion is noted, with mild left basilar opacity possibly reflecting atelectasis or mild pneumonia. There is no evidence of pleural effusion or pneumothorax. The cardiomediastinal silhouette is borderline normal in size. No acute osseous abnormalities are seen. IMPRESSION: Mild vascular congestion noted. Mild left basilar opacity may reflect atelectasis or mild pneumonia. Electronically Signed   By: Garald Balding M.D.   On: 10/06/2015 18:34    I-stat Lactic acid: 1.47 (did not cross over into EPIC)   Results for ISILELI, GATSON (MRN [id number]) as of 10/06/2015 20:19  Ref. Range 09/17/2015 13:08 09/18/2015 00:00 09/18/2015 02:12 10/06/2015 18:54  BUN Latest Ref Range: 6-20 mg/dL 32 (H) 27 (H) 26 (H) 45 (H)  Creatinine Latest Ref Range: 0.61-1.24 mg/dL 1.32 (H) 1.35 (H) 1.31 (H) 1.74 (H)   Results for GRADYN, FEIK (MRN [id number]) as of 10/06/2015 20:19  Ref. Range 09/17/2015 13:08 09/18/2015 02:12 09/19/2015 03:56 09/20/2015 03:33 10/06/2015 18:08  Hemoglobin Latest Ref Range: 13.0-17.0 g/dL 9.4 (L) 8.8 (L) 7.6 (L) 9.9 (L) 10.1 (L)  HCT Latest Ref Range: 39.0-52.0 % 30.4  (L) 29.7 (L) 25.0 (L) 31.8 (L) 30.4 (L)   2010:  Code Sepsis called on pt's arrival. IV abx started after [facility] and UC. IVF bolus 30mg /kg infusing. BP labile but near baseline, per EPIC chart review. T/C to [facility] Dr. Nehemiah Settle, case discussed, including:  HPI, pertinent PM/SHx, VS/PE, dx testing, ED course and treatment:  Agreeable to admit, requests to write temporary orders, obtain ICU bed to team APAdmits.      Francine Graven, DO 10/08/15 [phone]

## 2015-10-06 NOTE — ED Notes (Signed)
Patient from [place], here for evaluation for sepsis. Alert to voice. Confused

## 2015-10-06 NOTE — ED Notes (Signed)
Labs done.

## 2015-10-06 NOTE — Progress Notes (Addendum)
Pharmacy Note:  Initial antibiotics for Vancomycin and Zosyn ordered by EDP for Sepsis.  Estimated Creatinine Clearance: 46.5 mL/min (by C-G formula based on Cr of 1.31).   Allergies  Allergen Reactions  . Aricept [Donepezil Hcl] Other (See Comments)    Altered mental status  . Ativan [Lorazepam] Other (See Comments)    Altered mental status  . Atorvastatin Nausea And Vomiting and Other (See Comments)    Joint pain  . Namenda [Memantine] Other (See Comments)    Altered mental status  . Other     Wife says pt was given 2 different medications for his nerves but said they made him have SI.  Marland Kitchen Simvastatin Nausea And Vomiting and Other (See Comments)    Joint pain  . Sorbitan Nausea And Vomiting    headache    Filed Vitals:   10/06/15 1750  BP: 118/93  Pulse: 101  Temp: 101 F (38.3 C)  Resp: 22    Anti-infectives    Start     Dose/Rate Route Frequency Ordered Stop   10/06/15 1800  piperacillin-tazobactam (ZOSYN) IVPB 3.375 g     3.375 g 100 mL/hr over 30 Minutes Intravenous  Once 10/06/15 1758     10/06/15 1800  vancomycin (VANCOCIN) IVPB 1000 mg/200 mL premix     1,000 mg 200 mL/hr over 60 Minutes Intravenous  Once 10/06/15 1758        Plan: Initial doses of Vancomycin and Zosyn X 1 ordered. F/U admission orders for further dosing if therapy continued.  Pricilla Larsson, [facility] 10/06/2015 6:29 PM   PM: Admission H&P pending. Vancomycin and Zosyn to continue. Zosyn 3.375gm IV every 8 hours. Follow-up micro data, labs, vitals.  Vancomycin 1250mg  IV every 24 hours.  Pricilla Larsson, [facility]  10/06/2015 9:59 PM

## 2015-10-06 NOTE — ED Notes (Signed)
Hospitalist at the bedside 

## 2015-10-06 NOTE — ED Notes (Signed)
Lactic acid was drawn at 18:20, results are 1.47, Dr Thurnell Garbe aware, istat's are having problems with crossing over information to chart,

## 2015-10-06 NOTE — ED Notes (Signed)
Family upset with waiting time for ICU bed, Cherlyn Roberts called to bedside to speak with family, another nurse coming in 20 minute, pt will be first to take to floor. Family understands, just tire and wanting pt in the unit. Family states they are not upset with staff just the wait. Vitals reassesed, pt is calm and resting.

## 2015-10-07 DIAGNOSIS — R7881 Bacteremia: Secondary | ICD-10-CM

## 2015-10-07 LAB — BASIC METABOLIC PANEL
Anion gap: 8 (ref 5–15)
BUN: 43 mg/dL — AB (ref 6–20)
CALCIUM: 7.7 mg/dL — AB (ref 8.9–10.3)
CO2: 21 mmol/L — AB (ref 22–32)
CREATININE: 1.6 mg/dL — AB (ref 0.61–1.24)
Chloride: 109 mmol/L (ref 101–111)
GFR calc Af Amer: 44 mL/min — ABNORMAL LOW (ref >60)
GFR, EST NON AFRICAN AMERICAN: 38 mL/min — AB (ref >60)
GLUCOSE: 115 mg/dL — AB (ref 65–99)
Potassium: 3.8 mmol/L (ref 3.5–5.1)
Sodium: 138 mmol/L (ref 135–145)

## 2015-10-07 LAB — BLOOD CULTURE ID PANEL (REFLEXED)
Acinetobacter baumannii: NOT DETECTED
CANDIDA KRUSEI: NOT DETECTED
CANDIDA PARAPSILOSIS: NOT DETECTED
CANDIDA TROPICALIS: NOT DETECTED
CARBAPENEM RESISTANCE: NOT DETECTED
Candida albicans: NOT DETECTED
Candida glabrata: NOT DETECTED
ENTEROBACTERIACEAE SPECIES: DETECTED — AB
ENTEROCOCCUS SPECIES: NOT DETECTED
Enterobacter cloacae complex: NOT DETECTED
Escherichia coli: DETECTED — AB
Haemophilus influenzae: NOT DETECTED
KLEBSIELLA OXYTOCA: NOT DETECTED
KLEBSIELLA PNEUMONIAE: NOT DETECTED
Listeria monocytogenes: NOT DETECTED
Methicillin resistance: NOT DETECTED
Neisseria meningitidis: NOT DETECTED
PROTEUS SPECIES: NOT DETECTED
PSEUDOMONAS AERUGINOSA: NOT DETECTED
STAPHYLOCOCCUS SPECIES: NOT DETECTED
STREPTOCOCCUS AGALACTIAE: NOT DETECTED
STREPTOCOCCUS PNEUMONIAE: NOT DETECTED
Serratia marcescens: NOT DETECTED
Staphylococcus aureus (BCID): NOT DETECTED
Streptococcus pyogenes: NOT DETECTED
Streptococcus species: NOT DETECTED
Vancomycin resistance: NOT DETECTED

## 2015-10-07 LAB — CBC
HCT: 27.4 % — ABNORMAL LOW (ref 39.0–52.0)
Hemoglobin: 9 g/dL — ABNORMAL LOW (ref 13.0–17.0)
MCH: 29 pg (ref 26.0–34.0)
MCHC: 32.8 g/dL (ref 30.0–36.0)
MCV: 88.4 fL (ref 78.0–100.0)
PLATELETS: 193 10*3/uL (ref 150–400)
RBC: 3.1 MIL/uL — ABNORMAL LOW (ref 4.22–5.81)
RDW: 15.3 % (ref 11.5–15.5)
WBC: 16.7 10*3/uL — ABNORMAL HIGH (ref 4.0–10.5)

## 2015-10-07 LAB — MRSA PCR SCREENING: MRSA by PCR: NEGATIVE

## 2015-10-07 MED ORDER — SODIUM CHLORIDE 0.9 % IV SOLN
250.0000 mL | INTRAVENOUS | Status: DC | PRN
  Administered 2015-10-08: 250 mL via INTRAVENOUS

## 2015-10-07 MED ORDER — SERTRALINE HCL 50 MG PO TABS
100.0000 mg | ORAL_TABLET | Freq: Every day | ORAL | Status: DC
  Administered 2015-10-07 – 2015-10-10 (×4): 100 mg via ORAL
  Filled 2015-10-07 (×4): qty 2

## 2015-10-07 MED ORDER — DIVALPROEX SODIUM 125 MG PO CSDR
250.0000 mg | DELAYED_RELEASE_CAPSULE | Freq: Every day | ORAL | Status: AC
  Administered 2015-10-07: 250 mg via ORAL
  Filled 2015-10-07: qty 2

## 2015-10-07 MED ORDER — HYDROCODONE-ACETAMINOPHEN 5-325 MG PO TABS: 1.0000 | ORAL_TABLET | Freq: Four times a day (QID) | ORAL | Status: DC | PRN

## 2015-10-07 MED ORDER — ONDANSETRON HCL 4 MG/2ML IJ SOLN: 4.0000 mg | Freq: Four times a day (QID) | INTRAMUSCULAR | Status: DC | PRN

## 2015-10-07 MED ORDER — DOCUSATE SODIUM 100 MG PO CAPS
100.0000 mg | ORAL_CAPSULE | Freq: Two times a day (BID) | ORAL | Status: DC
  Administered 2015-10-07 – 2015-10-10 (×3): 100 mg via ORAL
  Filled 2015-10-07 (×6): qty 1

## 2015-10-07 MED ORDER — FLUTICASONE PROPIONATE 50 MCG/ACT NA SUSP
1.0000 | Freq: Two times a day (BID) | NASAL | Status: DC
  Administered 2015-10-07 – 2015-10-10 (×7): 1 via NASAL
  Filled 2015-10-07 (×2): qty 16

## 2015-10-07 MED ORDER — DIVALPROEX SODIUM ER 500 MG PO TB24
750.0000 mg | ORAL_TABLET | Freq: Every day | ORAL | Status: DC
  Filled 2015-10-07: qty 1

## 2015-10-07 MED ORDER — PANTOPRAZOLE SODIUM 20 MG PO TBEC
20.0000 mg | DELAYED_RELEASE_TABLET | Freq: Every day | ORAL | Status: DC
  Filled 2015-10-07: qty 1

## 2015-10-07 MED ORDER — DIVALPROEX SODIUM 125 MG PO CSDR
750.0000 mg | DELAYED_RELEASE_CAPSULE | Freq: Every day | ORAL | Status: DC
  Filled 2015-10-07 (×3): qty 6

## 2015-10-07 MED ORDER — PANTOPRAZOLE SODIUM 40 MG PO TBEC
40.0000 mg | DELAYED_RELEASE_TABLET | Freq: Every day | ORAL | Status: DC
  Administered 2015-10-07: 40 mg via ORAL
  Filled 2015-10-07: qty 1

## 2015-10-07 MED ORDER — SODIUM CHLORIDE 0.9 % IV SOLN
INTRAVENOUS | Status: AC
  Administered 2015-10-07: 01:00:00 via INTRAVENOUS

## 2015-10-07 MED ORDER — NITROGLYCERIN 0.4 MG SL SUBL: 0.4000 mg | SUBLINGUAL_TABLET | SUBLINGUAL | Status: DC | PRN

## 2015-10-07 MED ORDER — FLUTICASONE FUROATE 27.5 MCG/SPRAY NA SUSP: 1.0000 | Freq: Two times a day (BID) | NASAL | Status: DC

## 2015-10-07 MED ORDER — DIVALPROEX SODIUM 125 MG PO CSDR
250.0000 mg | DELAYED_RELEASE_CAPSULE | Freq: Three times a day (TID) | ORAL | Status: DC
  Filled 2015-10-07 (×10): qty 2

## 2015-10-07 MED ORDER — DIVALPROEX SODIUM 125 MG PO CSDR
750.0000 mg | DELAYED_RELEASE_CAPSULE | Freq: Every day | ORAL | Status: DC
  Administered 2015-10-07 – 2015-10-09 (×3): 750 mg via ORAL
  Filled 2015-10-07 (×4): qty 6

## 2015-10-07 MED ORDER — QUETIAPINE FUMARATE 25 MG PO TABS
25.0000 mg | ORAL_TABLET | Freq: Every day | ORAL | Status: DC
  Administered 2015-10-07 – 2015-10-09 (×4): 25 mg via ORAL
  Filled 2015-10-07 (×6): qty 1

## 2015-10-07 MED ORDER — ASPIRIN EC 325 MG PO TBEC
325.0000 mg | DELAYED_RELEASE_TABLET | Freq: Every day | ORAL | Status: DC
  Administered 2015-10-07: 325 mg via ORAL
  Filled 2015-10-07: qty 1

## 2015-10-07 MED ORDER — ENSURE ENLIVE PO LIQD
237.0000 mL | Freq: Two times a day (BID) | ORAL | Status: DC
  Administered 2015-10-07 – 2015-10-10 (×7): 237 mL via ORAL

## 2015-10-07 MED ORDER — DIVALPROEX SODIUM ER 250 MG PO TB24
ORAL_TABLET | ORAL | Status: AC
  Filled 2015-10-07: qty 3

## 2015-10-07 MED ORDER — PANTOPRAZOLE SODIUM 40 MG PO PACK
40.0000 mg | PACK | Freq: Every day | ORAL | Status: DC
  Administered 2015-10-08 – 2015-10-10 (×3): 40 mg
  Filled 2015-10-07 (×3): qty 20

## 2015-10-07 MED ORDER — ASPIRIN 325 MG PO TABS
325.0000 mg | ORAL_TABLET | Freq: Every day | ORAL | Status: DC
  Administered 2015-10-08 – 2015-10-10 (×3): 325 mg via ORAL
  Filled 2015-10-07 (×3): qty 1

## 2015-10-07 MED ORDER — ENOXAPARIN SODIUM 40 MG/0.4ML ~~LOC~~ SOLN
40.0000 mg | SUBCUTANEOUS | Status: DC
  Administered 2015-10-07 – 2015-10-09 (×3): 40 mg via SUBCUTANEOUS
  Filled 2015-10-07 (×4): qty 0.4

## 2015-10-07 MED ORDER — DIVALPROEX SODIUM 250 MG PO DR TAB
250.0000 mg | DELAYED_RELEASE_TABLET | Freq: Three times a day (TID) | ORAL | Status: DC
  Administered 2015-10-07 (×2): 250 mg via ORAL
  Filled 2015-10-07 (×11): qty 1

## 2015-10-07 NOTE — Care Management Note (Signed)
Case Management Note  Patient Details  Name: David Collier MRN: [id number] Date of Birth: 05-06-1928  Subjective/Objective:                  Pt admitted with sepsis, from [facility]. Anticipate return to SNF at [facility] is aware and will make arrangements for return to facility.   Action/Plan: No CM needs anticipated.   Expected Discharge Date:  10/11/15               Expected Discharge Plan:  Skilled Nursing Facility  In-House Referral:  Clinical Social Work  Discharge planning Services  CM Consult  Post Acute Care Choice:  NA Choice offered to:  NA  DME Arranged:    DME Agency:     HH Arranged:    [place] Agency:     Status of Service:  Completed, signed off  Medicare Important Message Given:  Yes Date Medicare IM Given:    Medicare IM give by:    Date Additional Medicare IM Given:    Additional Medicare Important Message give by:     If discussed at [place] of Stay Meetings, dates discussed:    Additional Comments:  Sherald Barge, RN 10/07/2015, 12:22 PM

## 2015-10-07 NOTE — Progress Notes (Signed)
MEDICATION RELATED CONSULT NOTE - INITIAL   Pharmacy Consult for Depakote sprinkles.  Pt cannot take ER Depakote tablets.   Indication: Conversion of home med  Allergies  Allergen Reactions  . Aricept [Donepezil Hcl] Other (See Comments)    Altered mental status  . Ativan [Lorazepam] Other (See Comments)    Altered mental status  . Atorvastatin Nausea And Vomiting and Other (See Comments)    Joint pain  . Namenda [Memantine] Other (See Comments)    Altered mental status  . Other     Wife says pt was given 2 different medications for his nerves but said they made him have SI.  Marland Kitchen Simvastatin Nausea And Vomiting and Other (See Comments)    Joint pain  . Sorbitan Nausea And Vomiting    headache   Estimated Creatinine Clearance: 34.4 mL/min (by C-G formula based on Cr of 1.6).  Medical History: Past Medical History  Diagnosis Date  . Hyperlipidemia   . Coronary atherosclerosis of native coronary artery     BMS circumflex and OM 2006, PTCA ISR 2010, moderate LAD disease  . DJD (degenerative joint disease)   . Macular degeneration   . Alzheimer's dementia   . CKD (chronic kidney disease), stage III   . History of stroke     Seen radiographically-left PICA; unknown to patient.  Marland Kitchen TIA (transient ischemic attack) 03/05/2014  . 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 CAD (coronary artery disease), native coronary artery     Initially diagnosed 2003 2006 catheterization with bare metal stenting of the first marginal branch of the circumflex and continuation branch of the circumflex 2010 catheterization showing normal left main, 50-60% LAD unchanged from before, proximal stent of marginal patent, severe in-stent restenosis treated with cutting balloon angioplasty, patent stent in continuation branch with mild in-stent restenosis, occluded nondominant right coronary artery.   . Myocardial infarction ([place])     x5    1980s  sees DR. MICHAEL COOPER  . Anginal pain (HCC)    NONE IN 1 MONTH   . Hypertension   . Stroke ([place])   . Depression   . Headache   . Seizures ([place])    Medications: (PTA med list) Prescriptions prior to admission  Medication Sig Dispense Refill Last Dose  . acetaminophen (TYLENOL) 325 MG tablet Take 2 tablets (650 mg total) by mouth every 6 (six) hours as needed for mild pain (or Fever >/= 101). 30 tablet 0 09/27/2015 at Unknown time  . aspirin EC 325 MG EC tablet Take 1 tablet (325 mg total) by mouth daily with breakfast. 30 tablet 0 10/06/2015 at Unknown time  . cyanocobalamin 500 MCG tablet Take 500 mcg by mouth daily.   10/06/2015 at Unknown time  . divalproex (DEPAKOTE ER) 500 MG 24 hr tablet Take 750 mg by mouth at bedtime.    10/05/2015 at 2200  . docusate sodium (COLACE) 100 MG capsule Take 1 capsule (100 mg total) by mouth 2 (two) times daily. 10 capsule 0 10/06/2015 at Unknown time  . ferrous sulfate 325 (65 FE) MG tablet Take 325 mg by mouth daily with breakfast.   10/06/2015 at Unknown time  . fluticasone (VERAMYST) 27.5 MCG/SPRAY nasal spray Place 1 spray into the nose 2 (two) times daily.   10/06/2015 at Unknown time  . HYDROcodone-acetaminophen (NORCO) 5-325 MG tablet Take 1 tablet by mouth every 6 (six) hours as needed for moderate pain. 40 tablet 0 10/01/2015 at Unknown time  . metoprolol (LOPRESSOR) 25 MG tablet Take  0.5 tablets (12.5 mg total) by mouth 2 (two) times daily. 30 tablet 0 10/06/2015 at [place]  . pantoprazole (PROTONIX) 20 MG tablet Take 1 tablet (20 mg total) by mouth daily. 30 tablet 0 10/05/2015 at Unknown time  . QUEtiapine (SEROQUEL) 25 MG tablet Take 25 mg by mouth at bedtime.    10/05/2015 at Unknown time  . sertraline (ZOLOFT) 100 MG tablet Take 100 mg by mouth daily.   10/06/2015 at Unknown time  . feeding supplement, ENSURE ENLIVE, (ENSURE ENLIVE) LIQD Take 237 mLs by mouth 2 (two) times daily between meals. 237 mL 0   . NITROSTAT 0.4 MG SL tablet DISSOLVE ONE TABLET UNDER THE TONGUE EVERY 5 MINUTES AS NEEDED FOR CHEST  PAIN 25 tablet 3 unknown  . Skin Protectants, Misc. (EUCERIN) cream Apply 1 application topically 2 (two) times daily.   09/17/2015 at Unknown time   Assessment: Per PTA med list pt was taking Depakote ER 750mg  po QHS.  D/W Dr Waldron Labs, pt very sleepy and difficult to arouse, potential adverse effect of medication.  Also pt has difficulty taking whole tablets at this time.  Will switch to Depakote Sprinkles, asked to keep same dosing regimen of 750mg  at bedtime to see if pt will be more awake and alert during daytime hours.  Capsule can be opened and contents sprinkled on food.    Plan: Depakote Sprinkles 250mg  tonight x 1 at bedtime then Depakote Sprinkles 750mg  at bedtime starting 6/17 Monitor for s/sx of seizures, monitor for somnolence  Hart Robinsons A 10/07/2015,4:10 PM

## 2015-10-07 NOTE — Care Management Important Message (Signed)
Important Message  Patient Details  Name: CRUSOE OATLEY MRN: [id number] Date of Birth: 01-09-1929   Medicare Important Message Given:  Yes    Sherald Barge, RN 10/07/2015, 7:37 AM

## 2015-10-07 NOTE — NC FL2 (Signed)
[place] LEVEL OF CARE SCREENING TOOL     IDENTIFICATION  Patient Name: David Collier Birthdate: [birth year] Sex: male Admission Date (Current Location): 10/06/2015  [facility] [place] Number:  [employer] and Address:  [facility] [address] [place]      Provider Number: [id number]  Attending Physician Name and Address:  Albertine Patricia, MD  Relative Name and Phone Number:       Current Level of Care: Hospital Recommended Level of Care: [place] Prior Approval Number:    Date Approved/Denied:   PASRR Number:  ([id number])  Discharge Plan: SNF    Current Diagnoses: Patient Active Problem List   Diagnosis Date Noted  . Sepsis ([place]) 10/06/2015  . UTI (lower urinary tract infection) 10/06/2015  . Acute renal failure superimposed on stage 3 chronic kidney disease ([place]) 10/06/2015  . Acute blood loss anemia 09/18/2015  . Closed left hip fracture ([place]) 09/17/2015  . Trochanteric fracture of right femur ([place]) 06/24/2015  . Community acquired pneumonia 06/24/2015  . Hip fracture ([place]) 06/24/2015  . Obesity (BMI 30-39.9) 07/11/2014  . Dementia 07/11/2014  . Unstable angina pectoris ([place]) 07/11/2014  . 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 CKD (chronic kidney disease), stage III 03/05/2014  . Macular degeneration (senile) of retina 02/16/2009  . Essential hypertension 02/16/2009  . Hyperlipidemia   . CAD (coronary artery disease), native coronary artery     Orientation RESPIRATION BLADDER Height & Weight        Normal Incontinent Weight: 164 lb 7.4 oz (74.6 kg) Height:  5\' 9"  (175.3 cm)  BEHAVIORAL SYMPTOMS/MOOD NEUROLOGICAL BOWEL NUTRITION STATUS      Incontinent Diet (see discharge summary)  AMBULATORY STATUS COMMUNICATION OF NEEDS Skin   Total Care Verbally  Normal                       Personal Care Assistance Level of Assistance  Total care           Functional Limitations Info             SPECIAL CARE FACTORS FREQUENCY                       Contractures      Additional Factors Info  Code Status, Allergies, Psychotropic Code Status Info: DNR Allergies Info: Anricept, Ativan, Atrovastatin, Namenda, Other, Simvastatin, Sorbitan Psychotropic Info: Depakote Sprinkle, Seroquel, Zoloft         Current Medications (10/07/2015):  This is the current hospital active medication list Current Facility-Administered Medications  Medication Dose Route Frequency Provider Last Rate Last Dose  . 0.9 %  sodium chloride infusion  250 mL Intravenous PRN Tanna Savoy Stinson, DO      . acetaminophen (TYLENOL) tablet 650 mg  650 mg Oral Q6H PRN Tanna Savoy Stinson, DO   650 mg at 10/06/15 2356  . [START ON 10/08/2015] aspirin tablet 325 mg  325 mg Oral Daily Dawood S Elgergawy, MD      . divalproex (DEPAKOTE SPRINKLE) capsule 250 mg  250 mg Oral Q8H Silver Huguenin Elgergawy, MD   0 mg at 10/07/15 1530  . docusate sodium (COLACE) capsule 100 mg  100 mg Oral BID Tanna Savoy Stinson, DO   100 mg at 10/07/15 [id number]  . enoxaparin (LOVENOX) injection 40 mg  40 mg  Subcutaneous Q24H Tanna Savoy Stinson, DO   40 mg at 10/07/15 0834  . feeding supplement (ENSURE ENLIVE) (ENSURE ENLIVE) liquid 237 mL  237 mL Oral BID BM Tanna Savoy Stinson, DO   237 mL at 10/07/15 1400  . fluticasone (FLONASE) 50 MCG/ACT nasal spray 1 spray  1 spray Each Nare BID Truett Mainland, DO   1 spray at 10/07/15 0834  . HYDROcodone-acetaminophen (NORCO/VICODIN) 5-325 MG per tablet 1 tablet  1 tablet Oral Q6H PRN Tanna Savoy Stinson, DO      . nitroGLYCERIN (NITROSTAT) SL tablet 0.4 mg  0.4 mg Sublingual Q5 min PRN Tanna Savoy Stinson, DO      . ondansetron [facility]) injection 4 mg  4 mg Intravenous Q6H PRN Tanna Savoy Stinson, DO      . pantoprazole sodium (PROTONIX) 40 mg/20 mL oral suspension 40 mg  40 mg Per  Tube Daily Albertine Patricia, MD   0 mg at 10/07/15 1430  . piperacillin-tazobactam (ZOSYN) IVPB 3.375 g  3.375 g Intravenous Q8H Tanna Savoy Stinson, DO   3.375 g at 10/07/15 0831  . QUEtiapine (SEROQUEL) tablet 25 mg  25 mg Oral QHS Tanna Savoy Stinson, DO   25 mg at 10/07/15 0053  . sertraline (ZOLOFT) tablet 100 mg  100 mg Oral Daily Tanna Savoy Stinson, DO   100 mg at 10/07/15 0834  . vancomycin (VANCOCIN) 1,250 mg in sodium chloride 0.9 % 250 mL IVPB  1,250 mg Intravenous Q24H Truett Mainland, DO         Discharge Medications: Please see discharge summary for a list of discharge medications.  Relevant Imaging Results:  Relevant Lab Results:   Additional Information    Jarmon Javid, Clydene Pugh, LCSW

## 2015-10-07 NOTE — Evaluation (Signed)
Clinical/Bedside Swallow Evaluation Patient Details  Name: David Collier MRN: [id number] Date of Birth: 19-Oct-1928  Today's Date: 10/07/2015 Time: SLP Start Time (ACUTE ONLY): 72 SLP Stop Time (ACUTE ONLY): 1718 SLP Time Calculation (min) (ACUTE ONLY): 48 min  Past Medical History:  Past Medical History  Diagnosis Date  . Hyperlipidemia   . Coronary atherosclerosis of native coronary artery     BMS circumflex and OM 2006, PTCA ISR 2010, moderate LAD disease  . DJD (degenerative joint disease)   . Macular degeneration   . Alzheimer's dementia   . CKD (chronic kidney disease), stage III   . History of stroke     Seen radiographically-left PICA; unknown to patient.  Marland Kitchen TIA (transient ischemic attack) 03/05/2014  . 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 CAD (coronary artery disease), native coronary artery     Initially diagnosed 2003 2006 catheterization with bare metal stenting of the first marginal branch of the circumflex and continuation branch of the circumflex 2010 catheterization showing normal left main, 50-60% LAD unchanged from before, proximal stent of marginal patent, severe in-stent restenosis treated with cutting balloon angioplasty, patent stent in continuation branch with mild in-stent restenosis, occluded nondominant right coronary artery.   . Myocardial infarction ([place])     x5    1980s  sees DR. MICHAEL COOPER  . Anginal pain (HCC)     NONE IN 1 MONTH   . Hypertension   . Stroke ([place])   . Depression   . Headache   . Seizures ([place])    Past Surgical History:  Past Surgical History  Procedure Laterality Date  . Appendectomy    . Cardiac catheterization      3 STENTS  AT  [place]  80S OR 90S  . Coronary angioplasty    . Cholecystectomy    . Multiple extractions with alveoloplasty N/A 10/22/2014    Procedure: MULTIPLE EXTRACTIONS  # 4,6, 7, 8, 10, 21, 22, 23, 24, 25, 26, 27, 28 and ALVEOLOPLASTY;  Surgeon: Diona Browner, DDS;  Location: [facility];   Service: Oral Surgery;  Laterality: N/A;  . Intramedullary (im) nail intertrochanteric Right 06/25/2015    Procedure: INTRAMEDULLARY (IM) NAIL INTERTROCHANTRIC;  Surgeon: Dorna Leitz, MD;  Location: [facility];  Service: Orthopedics;  Laterality: Right;  . Femur im nail Left 09/17/2015    Procedure: INTRAMEDULLARY (IM) NAIL FEMORAL;  Surgeon: Dorna Leitz, MD;  Location: [facility];  Service: Orthopedics;  Laterality: Left;   HPI:  David Collier is a 82 y.o. male with a history of coronary artery disease with stenting, stage III chronic kidney disease, vascular dementia, macular degeneration, history of myocardial infarction, status post stroke, headache. Patient was sent here from [place] home due to concerns of sepsis the patient extremely confused and initially unresponsive. His symptoms improved after fluid resuscitation and measures in the emergency department. The history is extremely limited due to the dementia and altered mental status. Patient has no current complaints.   Assessment / Plan / Recommendation Clinical Impression  Pt was evaluated at bedside noted to be lethargic although he was roused with repositioning and max verbal cues. Pt was unable to follow one step commands seemingly d/t underlying dementia; oral care was provided by means of toothettes revealing bilabial intraoral desquamation extending back in both lateral sulcii which was cleared, SLP further noted white coating on lingual surface resembling thrush which was reported to nursing. Pt consumed thin liquids demonstrating a consistent wet vocal quality and one delayed weak cough.  Pt demonstrated improved vocal clarity with NTL. Pt is reportedly on D2/ground textures at SNF. Pocketing has been noted by nursing with meds since admission, although, he tolerated puree/pudding textures without pocketing during trials with small bites administered and max verbal cues and visual cues and alternating bites and sips (NTL). Recommend NTL  and puree textures. Meds should be crushed in puree. Pt's daughter was educated at bedside extensively regarding oral care and adjustment to diet textures/consistencies. Aspiration precautions were reviewed and posted in the pt's room. Pt requires 100% supervision for all PO intake and 1:1 feeder. Small bites should be alternated with cup sips of NTL. He should only be fed when completely alert and when sitting at 90 degrees. ST to follow while in acute setting.    Aspiration Risk  Mild/Moderate Aspiration Risk    Diet Recommendation Dysphagia 1 (Puree);Nectar-thick liquid   Liquid Administration via: Cup Medication Administration: Crushed with puree Compensations: Slow rate;Small sips/bites;Minimize environmental distractions;Follow solids with liquid Postural Changes: Seated upright at 90 degrees    Other  Recommendations Oral Care Recommendations: Oral care BID   Follow up Recommendations  Skilled Nursing facility    Frequency and Duration min 2x/week  2 weeks       Prognosis Prognosis for Safe Diet Advancement: Guarded Barriers to Reach Goals: Cognitive deficits;Severity of deficits      Swallow Study   General Date of Onset: 10/06/15 HPI: David Collier is a 82 y.o. male with a history of coronary artery disease with stenting, stage III chronic kidney disease, vascular dementia, macular degeneration, history of myocardial infarction, status post stroke, headache. Patient was sent here from [place] home due to concerns of sepsis the patient extremely confused and initially unresponsive. His symptoms improved after fluid resuscitation and measures in the emergency department. The history is extremely limited due to the dementia and altered mental status. Patient has no current complaints. Type of Study: Bedside Swallow Evaluation Previous Swallow Assessment: none on record Diet Prior to this Study: NPO Temperature Spikes Noted: No Respiratory Status: Room air History of  Recent Intubation: No Behavior/Cognition: Cooperative;Pleasant mood;Confused;Requires cueing;Lethargic/Drowsy Oral Cavity Assessment: Dried secretions Oral Care Completed by SLP: Yes Oral Cavity - Dentition: Edentulous Vision:  (unknown) Self-Feeding Abilities: Total assist Patient Positioning: Upright in bed Baseline Vocal Quality: Normal Volitional Cough: Cognitively unable to elicit Volitional Swallow: Unable to elicit    Oral/Motor/Sensory Function Overall Oral Motor/Sensory Function: Within functional limits   Ice Chips Ice chips: Within functional limits   Thin Liquid Thin Liquid: Impaired Presentation: Cup Oral Phase Impairments: Reduced labial seal;Poor awareness of bolus Oral Phase Functional Implications: Right anterior spillage Pharyngeal  Phase Impairments: Suspected delayed Swallow;Multiple swallows;Wet Vocal Quality;Throat Clearing - Delayed;Cough - Delayed    Nectar Thick Nectar Thick Liquid: Within functional limits   Honey Thick Honey Thick Liquid: Not tested   Puree Puree: Impaired Presentation: Spoon Oral Phase Impairments: Poor awareness of bolus;Reduced lingual movement/coordination Oral Phase Functional Implications: Prolonged oral transit Pharyngeal Phase Impairments: Multiple swallows   Solid   [place] H. Roddie Mc, CCC-SLP Speech Language Pathologist   Solid: Not tested        Wende Bushy 10/07/2015,5:34 PM

## 2015-10-07 NOTE — Progress Notes (Signed)
MD informed of amended blood culture report.  Will adjust med orders if necessary.

## 2015-10-07 NOTE — Clinical Social Work Note (Signed)
Clinical Social Work Assessment  Patient Details  Name: David Collier MRN: [id number] Date of Birth: 07-Jan-1929  Date of referral:  10/07/15               Reason for consult:  Facility Placement, Other (Comment Required) (From [facility])                Permission sought to share information with:    Permission granted to share information::     Name::        Agency::     Relationship::     Contact Information:     Housing/Transportation Living arrangements for the past 2 months:  [place] of Information:  Adult Children Patient Interpreter Needed:  None Criminal Activity/Legal Involvement Pertinent to Current Situation/Hospitalization:    Significant Relationships:  Adult Children, Spouse Lives with:  Facility Resident Do you feel safe going back to the place where you live?  Yes Need for family participation in patient care:  Yes (Comment)  Care giving concerns:  None identified, patent is a resident at [facility].    Social Worker assessment / plan:  CSW met with patient's daughter, Montel Clock, who was at bedside. Ms. [place] Crane advised that patient has been at [facility] for about a month.  She stated that prior to that being at [facility], patient was at [facility] from [employer].  Ms. [place] Crane advised that it is the family's desire for patient to return to [facility] at discharge. She advised that patient was total care. CSW spoke with Jackelyn Poling at [facility]. She confirmed Ms. Hall's statements. She advised that patient can return to the facility and that he will likely become a long term resident.   Employment status:  Retired Nurse, adult PT Recommendations:  Not assessed at this time Information / Referral to community resources:     Patient/Family's Response to care: Family is agreeable for patient to return to [facility] at discharge.   Patient/Family's Understanding of and Emotional Response to Diagnosis, Current Treatment, and Prognosis:   Family is aware of patient's diagnosis, treatment and prognosis and believe he can be best supported in SNF.   Emotional Assessment Appearance:  Appears stated age Attitude/Demeanor/Rapport:  Unable to Assess Affect (typically observed):  Unable to Assess Orientation:    Alcohol / Substance use:  Not Applicable Psych involvement (Current and /or in the community):  No (Comment)  Discharge Needs  Concerns to be addressed:  Discharge Planning Concerns Readmission within the last 30 days:  No Current discharge risk:  Cognitively Impaired Barriers to Discharge:  No Barriers Identified   Ihor Gully, LCSW 10/07/2015, 3:26 PM

## 2015-10-07 NOTE — Progress Notes (Signed)
PROGRESS NOTE                                                                                                                                                                                                             Patient Demographics:    David Collier, is a 82 y.o. male, DOB - [birth year], RD:[id number]  Admit date - 10/06/2015   Admitting Physician Truett Mainland, DO  Outpatient Primary MD for the patient is David Caprice, DO  LOS - 1  Chief Complaint  Patient presents with  . Altered Mental Status       Brief Narrative   82 y.o. male with a history of coronary artery disease with stenting, stage III chronic kidney disease, vascular dementia, macular degeneration, history of myocardial infarction, status post stroke, headache.Presents with sepsis, most likely due to UTi, Blood Cx positive for GPC.   Subjective:    [name] today has, No headache, No chest pain, No abdominal pain -    Assessment  & Plan :    Principal Problem:   Sepsis ([place]) Active Problems:   Essential hypertension   CAD (coronary artery disease), native coronary artery   Dementia   Hip fracture (HCC)   UTI (lower urinary tract infection)   Acute renal failure superimposed on stage 3 chronic kidney disease (HCC)    Sepsis - This is most likely related to UTI and bacteremia -  Continue with broad-spectrum antibiotics   UTI - Continue with broad-spectrum antibiotic, follow urine culture  Bacteremia - Culture growing gram-positive cocci, continue the IV vancomycin, will repeat blood cultures in a.m.  Hip fracture - We'll continue to monitor skin incision, looks clean   Acute renal failure on CKD stage III - Improving with IV fluids  Hypertension -  hold beta blockers giving sepsis and soft blood pressure  CAD - Continue with aspirin  Dementia - Continue with home medication   Code Status : DNR  Family Communication  : Daughter at  bedside  Disposition Plan  : Back to SNF when stable, in 2-3 days  Consults  :  none  Procedures  : none  DVT Prophylaxis  :  Lovenox   Lab Results  Component Value Date   PLT 193 10/07/2015    Antibiotics  :    Anti-infectives    Start  Dose/Rate Route Frequency Ordered Stop   10/07/15 1600  vancomycin (VANCOCIN) 1,250 mg in sodium chloride 0.9 % 250 mL IVPB     1,250 mg 166.7 mL/hr over 90 Minutes Intravenous Every 24 hours 10/06/15 2201     10/07/15 0200  piperacillin-tazobactam (ZOSYN) IVPB 3.375 g     3.375 g 12.5 mL/hr over 240 Minutes Intravenous Every 8 hours 10/06/15 2201     10/06/15 1800  piperacillin-tazobactam (ZOSYN) IVPB 3.375 g     3.375 g 100 mL/hr over 30 Minutes Intravenous  Once 10/06/15 1758 10/06/15 1946   10/06/15 1800  vancomycin (VANCOCIN) IVPB 1000 mg/200 mL premix     1,000 mg 200 mL/hr over 60 Minutes Intravenous  Once 10/06/15 1758 10/06/15 1958        Objective:   Filed Vitals:   10/07/15 0300 10/07/15 0400 10/07/15 0842 10/07/15 1240  BP: 97/52 83/71    Pulse: 84 81    Temp:   96.9 F (36.1 C) 96.9 F (36.1 C)  TempSrc:   Axillary Axillary  Resp: 17 20    Height:      Weight:      SpO2: 95% 94%      Wt Readings from Last 3 Encounters:  10/07/15 74.6 kg (164 lb 7.4 oz)  09/17/15 88.451 kg (195 lb)  09/06/15 88.451 kg (195 lb)     Intake/Output Summary (Last 24 hours) at 10/07/15 1536 Last data filed at 10/07/15 1043  Gross per 24 hour  Intake    370 ml  Output     35 ml  Net    335 ml     Physical Exam  Awake , Demented, confused Supple Neck,No JVD,  Symmetrical Chest wall movement, Good air movement bilaterally, CTAB RRR,No Gallops,Rubs or new Murmurs, No Parasternal Heave +ve B.Sounds, Abd Soft, No tenderness,  No Cyanosis, Clubbing or edema, No new Rash or bruise      Data Review:    CBC  Recent Labs Lab 10/06/15 1808 10/07/15 0507  WBC 21.3* 16.7*  HGB 10.1* 9.0*  HCT 30.4* 27.4*  PLT 274 193   MCV 87.9 88.4  MCH 29.2 29.0  MCHC 33.2 32.8  RDW 15.2 15.3  LYMPHSABS 0.7  --   MONOABS 1.0  --   EOSABS 0.0  --   BASOSABS 0.0  --     Chemistries   Recent Labs Lab 10/06/15 1854 10/07/15 0507  NA 134* 138  K 3.7 3.8  CL 106 109  CO2 24 21*  GLUCOSE 105* 115*  BUN 45* 43*  CREATININE 1.74* 1.60*  CALCIUM 7.3* 7.7*  AST 10*  --   ALT 7*  --   ALKPHOS 128*  --   BILITOT 0.5  --    ------------------------------------------------------------------------------------------------------------------ No results for input(s): CHOL, HDL, LDLCALC, TRIG, CHOLHDL, LDLDIRECT in the last 72 hours.  Lab Results  Component Value Date   HGBA1C 5.5 03/05/2014   ------------------------------------------------------------------------------------------------------------------ No results for input(s): TSH, T4TOTAL, T3FREE, THYROIDAB in the last 72 hours.  Invalid input(s): FREET3 ------------------------------------------------------------------------------------------------------------------ No results for input(s): VITAMINB12, FOLATE, FERRITIN, TIBC, IRON, RETICCTPCT in the last 72 hours.  Coagulation profile No results for input(s): INR, PROTIME in the last 168 hours.  No results for input(s): DDIMER in the last 72 hours.  Cardiac Enzymes  Recent Labs Lab 10/06/15 1854  TROPONINI 0.03   ------------------------------------------------------------------------------------------------------------------    Component Value Date/Time   BNP 82.1 07/11/2014 1958    Inpatient Medications  Scheduled Meds: . Derrill Memo  ON 10/08/2015] aspirin  325 mg Oral Daily  . divalproex  250 mg Oral Q8H  . docusate sodium  100 mg Oral BID  . enoxaparin (LOVENOX) injection  40 mg Subcutaneous Q24H  . feeding supplement (ENSURE ENLIVE)  237 mL Oral BID BM  . fluticasone  1 spray Each Nare BID  . pantoprazole sodium  40 mg Per Tube Daily  . piperacillin-tazobactam (ZOSYN)  IV  3.375 g  Intravenous Q8H  . QUEtiapine  25 mg Oral QHS  . sertraline  100 mg Oral Daily  . vancomycin  1,250 mg Intravenous Q24H   Continuous Infusions:  PRN Meds:.sodium chloride, acetaminophen, HYDROcodone-acetaminophen, nitroGLYCERIN, ondansetron (ZOFRAN) IV  Micro Results Recent Results (from the past 240 hour(s))  Blood Culture (routine x 2)     Status: None (Preliminary result)   Collection Time: 10/06/15  6:47 PM  Result Value Ref Range Status   Specimen Description BLOOD RIGHT ARM  Final   Special Requests BOTTLES DRAWN AEROBIC AND ANAEROBIC 6CC  Final   Culture  Setup Time   Final    GRAM POSITIVE RODS CRITICAL RESULT CALLED TO, READ BACK BY AND VERIFIED WITH: HANEY,L AT 1100 BY HUFFINES,S ON 10/07/15   Culture PENDING  Incomplete   Report Status PENDING  Incomplete  Blood Culture (routine x 2)     Status: None (Preliminary result)   Collection Time: 10/06/15  6:54 PM  Result Value Ref Range Status   Specimen Description BLOOD LEFT HAND  Final   Special Requests BOTTLES DRAWN AEROBIC AND ANAEROBIC 6CC  Final   Culture  Setup Time   Final    GRAM POSITIVE RODS CRITICAL RESULT CALLED TO, READ BACK BY AND VERIFIED WITH: JARRELL,W AT 1150 BY HUFFINES,S ON 10/07/15   Culture PENDING  Incomplete   Report Status PENDING  Incomplete  MRSA PCR Screening     Status: None   Collection Time: 10/07/15 12:42 AM  Result Value Ref Range Status   MRSA by PCR NEGATIVE NEGATIVE Final    Comment:        The GeneXpert MRSA Assay (FDA approved for NASAL specimens only), is one component of a comprehensive MRSA colonization surveillance program. It is not intended to diagnose MRSA infection nor to guide or monitor treatment for MRSA infections.     Radiology Reports Dg Chest Port 1 View  10/06/2015  CLINICAL DATA:  Acute onset of altered mental status. Code sepsis. Fever and lethargy. Initial encounter. EXAM: PORTABLE CHEST 1 VIEW COMPARISON:  Chest radiograph performed 09/17/2015 FINDINGS:  The lungs are well-aerated. Mild vascular congestion is noted, with mild left basilar opacity possibly reflecting atelectasis or mild pneumonia. There is no evidence of pleural effusion or pneumothorax. The cardiomediastinal silhouette is borderline normal in size. No acute osseous abnormalities are seen. IMPRESSION: Mild vascular congestion noted. Mild left basilar opacity may reflect atelectasis or mild pneumonia. Electronically Signed   By: Garald Balding M.D.   On: 10/06/2015 18:34   Dg Chest Port 1 View  09/17/2015  CLINICAL DATA:  Status post fall, left hip fracture EXAM: PORTABLE CHEST 1 VIEW COMPARISON:  09/06/2015 FINDINGS: The heart size and mediastinal contours are within normal limits. Both lungs are clear. Possible right eighth lateral rib fracture. Mild osteoarthritis of bilateral glenohumeral joints. IMPRESSION: No active disease. Possible right eighth lateral rib fracture. Electronically Signed   By: Kathreen Devoid   On: 09/17/2015 13:22   Dg Shoulder Left Port  09/20/2015  CLINICAL DATA:  Pain following fall  EXAM: LEFT SHOULDER - 2 VIEW COMPARISON:  None. FINDINGS: Frontal and Y scapular images were obtained. There is no demonstrable acute fracture or dislocation. There is mild generalized osteoarthritic change with superior migration of the humeral head on the left. No erosive change. Visualized left lung clear. IMPRESSION: Mild generalized osteoarthritic change. Apparent chronic rotator cuff tear on the left with superior migration of the left humeral head. No acute fracture or dislocation. Electronically Signed   By: Lowella Grip III M.D.   On: 09/20/2015 13:44   Dg C-arm 61-120 Min  09/18/2015  CLINICAL DATA:  Left hip surgery. EXAM: DG C-ARM 61-120 MIN; LEFT FEMUR 2 VIEWS COMPARISON:  Radiographs earlier this day. FINDINGS: Four fluoroscopic spot images from the operating room during left femur ORIF demonstrate intra medullary rod with distal and proximal trans trochanteric screws  traversing left proximal femur fracture. Fluoroscopy time not provided. IMPRESSION: Intraoperative fluoroscopic spot images post intra medullary rod and screw fixation of proximal left femur fracture. Electronically Signed   By: Jeb Levering M.D.   On: 09/18/2015 01:41   Dg Femur Min 2 Views Left  09/18/2015  CLINICAL DATA:  Left hip surgery. EXAM: DG C-ARM 61-120 MIN; LEFT FEMUR 2 VIEWS COMPARISON:  Radiographs earlier this day. FINDINGS: Four fluoroscopic spot images from the operating room during left femur ORIF demonstrate intra medullary rod with distal and proximal trans trochanteric screws traversing left proximal femur fracture. Fluoroscopy time not provided. IMPRESSION: Intraoperative fluoroscopic spot images post intra medullary rod and screw fixation of proximal left femur fracture. Electronically Signed   By: Jeb Levering M.D.   On: 09/18/2015 01:41   Dg Hips Bilat With Pelvis Min 5 Views  09/17/2015  CLINICAL DATA:  FALL, BILATERAL HIP PAIN, Pt brought in by EMS from [facility]. Pt fell from bed.on Friday 09/16/15,Nursing home note reports pt complaining of pain while being turned to left side, BEST IMAGES OBTAINED DUE TO PATIENTS CONDITION, PATIENT HELD FOR SEVERAL IMAGES. HISTORY OF HTN, STROKE, CAD, TIA, ALZHEIMER'S, DEMENTIA, CKD, INTRAMEDULLARY (IM) NAIL INTERTROCHANTERIC OF RIGHT ON 06/25/2015 EXAM: DG HIP (WITH OR WITHOUT PELVIS) 5+V BILAT COMPARISON:  06/25/2015 and previous FINDINGS: Sliding screw and IM rod transfix right intertrochanteric fracture in near anatomic alignment. The distal into the IM rod is not included. There is impacted midcervical fracture of the left femur. No dislocation. Bony pelvis intact. Bilateral pelvic phleboliths. IMPRESSION: 1. Acute impacted midcervical left femur fracture. 2. Postop changes across the right femoral neck without acute abnormality. Electronically Signed   By: Lucrezia Europe M.D.   On: 09/17/2015 12:46    Time Spent in minutes  25  minutes   Schyler Counsell M.D on 10/07/2015 at 3:36 PM  Between 7am to 7pm - Pager - [phone]  After 7pm go to [email or web address] - password [facility]  [facility] -  Office  [phone]

## 2015-10-07 NOTE — Plan of Care (Signed)
Problem: Fluid Volume: Goal: Hemodynamic stability will improve Outcome: Progressing Patient admitted to the floor and IVF started per [facility]

## 2015-10-07 NOTE — Progress Notes (Signed)
PHARMACY - PHYSICIAN COMMUNICATION CRITICAL VALUE ALERT - BLOOD CULTURE IDENTIFICATION (BCID)  Name of physician (or Provider) Contacted / D/W: Dr [facility]ron [facility]  Changes to prescribed antibiotics required: Continue Vancomycin and Zosyn pending finalized cultures.     Recent Results (from the past 240 hour(s))  Blood Culture (routine x 2)     Status: None (Preliminary result)   Collection Time: 10/06/15  6:47 PM  Result Value Ref Range Status   Specimen Description BLOOD RIGHT ARM  Final   Special Requests BOTTLES DRAWN AEROBIC AND ANAEROBIC 6CC  Final   Culture  Setup Time   Final    GRAM POSITIVE RODS CRITICAL RESULT CALLED TO, READ BACK BY AND VERIFIED WITH: HANEY,L AT 1100 BY HUFFINES,S ON 10/07/15   Culture PENDING  Incomplete   Report Status PENDING  Incomplete  Blood Culture (routine x 2)     Status: None (Preliminary result)   Collection Time: 10/06/15  6:54 PM  Result Value Ref Range Status   Specimen Description BLOOD LEFT HAND  Final   Special Requests BOTTLES DRAWN AEROBIC AND ANAEROBIC 6CC  Final   Culture  Setup Time   Final    GRAM POSITIVE RODS CRITICAL RESULT CALLED TO, READ BACK BY AND VERIFIED WITH: JARRELL,W AT 1150 BY HUFFINES,S ON 10/07/15   Culture PENDING  Incomplete   Report Status PENDING  Incomplete  MRSA PCR Screening     Status: None   Collection Time: 10/07/15 12:42 AM  Result Value Ref Range Status   MRSA by PCR NEGATIVE NEGATIVE Final    Comment:        The GeneXpert MRSA Assay (FDA approved for NASAL specimens only), is one component of a comprehensive MRSA colonization surveillance program. It is not intended to diagnose MRSA infection nor to guide or monitor treatment for MRSA infections.    Hart Robinsons, PharmD Clinical Pharmacist Pager:  [phone] 10/07/2015 2:31 PM

## 2015-10-07 NOTE — Progress Notes (Signed)
Critical value called to RN by microbiology @ [phone]. Pt positive for enterobacteriaceae and e coli in blood cultures. MD notified by text page.

## 2015-10-08 DIAGNOSIS — R7881 Bacteremia: Secondary | ICD-10-CM

## 2015-10-08 LAB — BASIC METABOLIC PANEL
ANION GAP: 7 (ref 5–15)
BUN: 39 mg/dL — ABNORMAL HIGH (ref 6–20)
CALCIUM: 8.1 mg/dL — AB (ref 8.9–10.3)
CHLORIDE: 113 mmol/L — AB (ref 101–111)
CO2: 20 mmol/L — AB (ref 22–32)
CREATININE: 1.36 mg/dL — AB (ref 0.61–1.24)
GFR calc non Af Amer: 46 mL/min — ABNORMAL LOW (ref >60)
GFR, EST AFRICAN AMERICAN: 53 mL/min — AB (ref >60)
Glucose, Bld: 91 mg/dL (ref 65–99)
Potassium: 4.1 mmol/L (ref 3.5–5.1)
SODIUM: 140 mmol/L (ref 135–145)

## 2015-10-08 LAB — CBC
HCT: 31.3 % — ABNORMAL LOW (ref 39.0–52.0)
Hemoglobin: 9.9 g/dL — ABNORMAL LOW (ref 13.0–17.0)
MCH: 28.4 pg (ref 26.0–34.0)
MCHC: 31.6 g/dL (ref 30.0–36.0)
MCV: 89.9 fL (ref 78.0–100.0)
PLATELETS: 142 10*3/uL — AB (ref 150–400)
RBC: 3.48 MIL/uL — AB (ref 4.22–5.81)
RDW: 15.5 % (ref 11.5–15.5)
WBC: 11.9 10*3/uL — AB (ref 4.0–10.5)

## 2015-10-08 LAB — VALPROIC ACID LEVEL: VALPROIC ACID LVL: 23 ug/mL — AB (ref 50.0–100.0)

## 2015-10-08 MED ORDER — SODIUM CHLORIDE 0.9 % IV SOLN
INTRAVENOUS | Status: DC
  Administered 2015-10-09: 22:00:00 via INTRAVENOUS

## 2015-10-08 MED ORDER — DEXTROSE 5 % IV SOLN
2.0000 g | INTRAVENOUS | Status: DC
  Administered 2015-10-08 – 2015-10-09 (×2): 2 g via INTRAVENOUS
  Filled 2015-10-08 (×3): qty 2

## 2015-10-08 MED ORDER — STARCH (THICKENING) PO POWD
ORAL | Status: DC | PRN
  Filled 2015-10-08: qty 227

## 2015-10-08 NOTE — Progress Notes (Addendum)
PT WILL BE TRANSFERRING TO ROOM 339 AS MED/SURG PT. PT ALERT. TALKING,BUT MOST IS  INTELIGABLE. IV'S PATENT. CONDOM CATH DRAINING CLEAR YELLOW URINE. DENIES ANY DISCOMFORT. 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 HEARS BETTER IN THE LEFT EAR.Marland Kitchen REPORT CALLED TO RN ON 300.

## 2015-10-08 NOTE — Progress Notes (Signed)
RT was able to ask the patient's daughter ( she is an Therapist, sports) if she could help instruct him on the use of IS. She stated she had attempted to use the device with him and he reacted negatively and stated  he "was scared." She asked that perhaps we could try again tomorrow. RT will attempt again tomorrow .

## 2015-10-08 NOTE — Progress Notes (Signed)
RT attempted to instruct patient on use of IS and he seemed unable to follow the directions . Per family friend the patients daughter maybe able to help  RT instruct  patient on IS use.  RT will try again later.

## 2015-10-08 NOTE — Progress Notes (Signed)
PHARMACY - PHYSICIAN COMMUNICATION CRITICAL VALUE ALERT - BLOOD CULTURE IDENTIFICATION (BCID)  Name of physician (or Provider) Contacted / D/W: Dr [facility]ron [facility]  Changes to prescribed antibiotics required:  Rocephin 2gm IV q24hrs   Recent Results (from the past 240 hour(s))  Urine culture     Status: Abnormal (Preliminary result)   Collection Time: 10/06/15  6:20 PM  Result Value Ref Range Status   Specimen Description URINE, CATHETERIZED  Final   Special Requests NONE  Final   Culture >=100,000 COLONIES/mL ESCHERICHIA COLI (A)  Final   Report Status PENDING  Incomplete  Blood Culture (routine x 2)     Status: Abnormal (Preliminary result)   Collection Time: 10/06/15  6:47 PM  Result Value Ref Range Status   Specimen Description BLOOD RIGHT ARM  Final   Special Requests BOTTLES DRAWN AEROBIC AND ANAEROBIC 6CC  Final   Culture  Setup Time   Final    GRAM POSITIVE RODS CRITICAL RESULT CALLED TO, READ BACK BY AND VERIFIED WITH: HANEY,L AT 1100 BY HUFFINES,S ON 10/07/15 GRAM NEGATIVE RODS AEROBIC BOTTLE ONLY CORRECTED REPORT PREVIOUSLY REPORTED AS GRAM POSITIVE RODS CRITICAL RESULT CALLED TO, READ BACK BY AND VERIFIED WITH: Jonetta Osgood RN [id number] 10/07/15 A BROWNING Performed at [place] (A)  Final   Report Status PENDING  Incomplete  Blood Culture (routine x 2)     Status: Abnormal (Preliminary result)   Collection Time: 10/06/15  6:54 PM  Result Value Ref Range Status   Specimen Description BLOOD LEFT HAND  Final   Special Requests BOTTLES DRAWN AEROBIC AND ANAEROBIC 6CC  Final   Culture  Setup Time   Final    GRAM POSITIVE RODS CRITICAL RESULT CALLED TO, READ BACK BY AND VERIFIED WITH: JARRELL,W AT 1150 BY HUFFINES,S ON 10/07/15 GRAM NEGATIVE RODS IN BOTH AEROBIC AND ANAEROBIC BOTTLES CRITICAL RESULT CALLED TO, READ BACK BY AND VERIFIED WITH: Jonetta Osgood RN [id number] 10/07/15 A BROWNING Organism ID to follow CRITICAL RESULT CALLED TO, READ BACK BY AND VERIFIED  WITH: Carleene Mains RN [id number] 10/07/15 A BROWNING Performed at [place] (A)  Final   Report Status PENDING  Incomplete  Blood Culture ID Panel (Reflexed)     Status: Abnormal   Collection Time: 10/06/15  6:54 PM  Result Value Ref Range Status   Enterococcus species NOT DETECTED NOT DETECTED Final   Vancomycin resistance NOT DETECTED NOT DETECTED Final   Listeria monocytogenes NOT DETECTED NOT DETECTED Final   Staphylococcus species NOT DETECTED NOT DETECTED Final   Staphylococcus aureus NOT DETECTED NOT DETECTED Final   Methicillin resistance NOT DETECTED NOT DETECTED Final   Streptococcus species NOT DETECTED NOT DETECTED Final   Streptococcus agalactiae NOT DETECTED NOT DETECTED Final   Streptococcus pneumoniae NOT DETECTED NOT DETECTED Final   Streptococcus pyogenes NOT DETECTED NOT DETECTED Final   Acinetobacter baumannii NOT DETECTED NOT DETECTED Final   Enterobacteriaceae species DETECTED (A) NOT DETECTED Final    Comment: CRITICAL RESULT CALLED TO, READ BACK BY AND VERIFIED WITH: Jerilynn Mages MCDANIEL RN [id number] 10/07/15 A BROWNING    Enterobacter cloacae complex NOT DETECTED NOT DETECTED Final   Escherichia coli DETECTED (A) NOT DETECTED Final    Comment: CRITICAL RESULT CALLED TO, READ BACK BY AND VERIFIED WITHJerilynn Mages [facility] RN [id number] 10/07/15 A BROWNING    Klebsiella oxytoca NOT DETECTED NOT DETECTED Final   Klebsiella pneumoniae NOT DETECTED NOT DETECTED Final  Proteus species NOT DETECTED NOT DETECTED Final   Serratia marcescens NOT DETECTED NOT DETECTED Final   Carbapenem resistance NOT DETECTED NOT DETECTED Final   Haemophilus influenzae NOT DETECTED NOT DETECTED Final   Neisseria meningitidis NOT DETECTED NOT DETECTED Final   Pseudomonas aeruginosa NOT DETECTED NOT DETECTED Final   Candida albicans NOT DETECTED NOT DETECTED Final   Candida glabrata NOT DETECTED NOT DETECTED Final   Candida krusei NOT DETECTED NOT DETECTED Final   Candida  parapsilosis NOT DETECTED NOT DETECTED Final   Candida tropicalis NOT DETECTED NOT DETECTED Final    Comment: Performed at [facility]  MRSA PCR Screening     Status: None   Collection Time: 10/07/15 12:42 AM  Result Value Ref Range Status   MRSA by PCR NEGATIVE NEGATIVE Final    Comment:        The GeneXpert MRSA Assay (FDA approved for NASAL specimens only), is one component of a comprehensive MRSA colonization surveillance program. It is not intended to diagnose MRSA infection nor to guide or monitor treatment for MRSA infections.   Culture, blood (Routine X 2) w Reflex to ID Panel     Status: None (Preliminary result)   Collection Time: 10/08/15  4:29 AM  Result Value Ref Range Status   Specimen Description BLOOD LEFT ARM  Final   Special Requests BOTTLES DRAWN AEROBIC ONLY 4CC  Final   Culture NO GROWTH < 12 HOURS  Final   Report Status PENDING  Incomplete  Culture, blood (Routine X 2) w Reflex to ID Panel     Status: None (Preliminary result)   Collection Time: 10/08/15  8:04 AM  Result Value Ref Range Status   Specimen Description BLOOD RIGHT ARM  Final   Special Requests BOTTLES DRAWN AEROBIC AND ANAEROBIC [place]  Final   Culture NO GROWTH < 12 HOURS  Final   Report Status PENDING  Incomplete   Hart Robinsons, PharmD Clinical Pharmacist Pager:  [phone] 10/08/2015 12:28 PM

## 2015-10-08 NOTE — Progress Notes (Signed)
PROGRESS NOTE                                                                                                                                                                                                             Patient Demographics:    David Collier, is a 82 y.o. male, DOB - [birth year], [id number]  Admit date - 10/06/2015   Admitting Physician David Mainland, DO  Outpatient Primary MD for the patient is David Caprice, DO  LOS - 2  Chief Complaint  Patient presents with  . Altered Mental Status       Brief Narrative   82 y.o. male with a history of coronary artery disease with stenting, stage III chronic kidney disease, vascular dementia, macular degeneration, history of myocardial infarction, status post stroke, headache.Presents with sepsis, most likely due to UTI And bacteremia.   Subjective:    David Collier today has, No headache, No chest pain, No abdominal pain , Reports he is feeling better today.   Assessment  & Plan :    Principal Problem:   Sepsis ([place]) Active Problems:   Essential hypertension   CAD (coronary artery disease), native coronary artery   Dementia   Hip fracture (HCC)   UTI (lower urinary tract infection)   Acute renal failure superimposed on stage 3 chronic kidney disease (HCC)    Sepsis - This is most likely related to UTI and bacteremia - Physiology of sepsis appears to be improving, leukocytosis trending down, afebrile over last 24 hours, no further hypotension - Initially on IV vancomycin and Zosyn, currently transitioned to Rocephin on 6/17 - Appears to be improving, will transfer to medical floor   UTI - Urine culture growing Escherichia coli, continue with Rocephin  Bacteremia  - Blood culture on 6/15 growing gram-positive rods(contamination ??), And Escherichia coli - Repeat blood cultures 6/17 with no growth to date  - Initially on IV vancomycin and Zosyn, transition 6/17 to Rocephin  .  Hip fracture - We'll continue to monitor skin incision, looks clean   Acute renal failure on CKD stage III - Improving with IV fluids  Hypertension -  hold beta blockers giving sepsis and soft blood pressure  CAD - Continue with aspirin  Dementia - Continue with home medication   Code Status : DNR  Family Communication  : Daughter at  bedside  Disposition Plan  : Back to SNF when stable, in 2-3 days  Consults  :  none  Procedures  : none  DVT Prophylaxis  :  Lovenox   Lab Results  Component Value Date   PLT 142* 10/08/2015    Antibiotics  :    Anti-infectives    Start     Dose/Rate Route Frequency Ordered Stop   10/08/15 1400  cefTRIAXone (ROCEPHIN) 2 g in dextrose 5 % 50 mL IVPB     2 g 100 mL/hr over 30 Minutes Intravenous Every 24 hours 10/08/15 1223     10/07/15 1600  vancomycin (VANCOCIN) 1,250 mg in sodium chloride 0.9 % 250 mL IVPB  Status:  Discontinued     1,250 mg 166.7 mL/hr over 90 Minutes Intravenous Every 24 hours 10/06/15 2201 10/08/15 1223   10/07/15 0200  piperacillin-tazobactam (ZOSYN) IVPB 3.375 g  Status:  Discontinued     3.375 g 12.5 mL/hr over 240 Minutes Intravenous Every 8 hours 10/06/15 2201 10/08/15 1223   10/06/15 1800  piperacillin-tazobactam (ZOSYN) IVPB 3.375 g     3.375 g 100 mL/hr over 30 Minutes Intravenous  Once 10/06/15 1758 10/06/15 1946   10/06/15 1800  vancomycin (VANCOCIN) IVPB 1000 mg/200 mL premix     1,000 mg 200 mL/hr over 60 Minutes Intravenous  Once 10/06/15 1758 10/06/15 1958        Objective:   Filed Vitals:   10/08/15 0500 10/08/15 0600 10/08/15 0852 10/08/15 1234  BP: 144/77 139/81    Pulse: 90 87    Temp:   99 F (37.2 C) 98.8 F (37.1 C)  TempSrc:   Axillary Axillary  Resp: 15 19    Height:      Weight:      SpO2: 100% 96%      Wt Readings from Last 3 Encounters:  10/08/15 91.4 kg (201 lb 8 oz)  09/17/15 88.451 kg (195 lb)  09/06/15 88.451 kg (195 lb)     Intake/Output Summary (Last  24 hours) at 10/08/15 1401 Last data filed at 10/08/15 0600  Gross per 24 hour  Intake    750 ml  Output      0 ml  Net    750 ml     Physical Exam  Awake , Demented, confused Supple Neck,No JVD,  Symmetrical Chest wall movement, Good air movement bilaterally, CTAB RRR,No Gallops,Rubs or new Murmurs, No Parasternal Heave +ve B.Sounds, Abd Soft, No tenderness,  No Cyanosis, Clubbing or edema, No new Rash or bruise      Data Review:    CBC  Recent Labs Lab 10/06/15 1808 10/07/15 0507 10/08/15 0429  WBC 21.3* 16.7* 11.9*  HGB 10.1* 9.0* 9.9*  HCT 30.4* 27.4* 31.3*  PLT 274 193 142*  MCV 87.9 88.4 89.9  MCH 29.2 29.0 28.4  MCHC 33.2 32.8 31.6  RDW 15.2 15.3 15.5  LYMPHSABS 0.7  --   --   MONOABS 1.0  --   --   EOSABS 0.0  --   --   BASOSABS 0.0  --   --     Chemistries   Recent Labs Lab 10/06/15 1854 10/07/15 0507 10/08/15 0429  NA 134* 138 140  K 3.7 3.8 4.1  CL 106 109 113*  CO2 24 21* 20*  GLUCOSE 105* 115* 91  BUN 45* 43* 39*  CREATININE 1.74* 1.60* 1.36*  CALCIUM 7.3* 7.7* 8.1*  AST 10*  --   --   ALT 7*  --   --  ALKPHOS 128*  --   --   BILITOT 0.5  --   --    ------------------------------------------------------------------------------------------------------------------ No results for input(s): CHOL, HDL, LDLCALC, TRIG, CHOLHDL, LDLDIRECT in the last 72 hours.  Lab Results  Component Value Date   HGBA1C 5.5 03/05/2014   ------------------------------------------------------------------------------------------------------------------ No results for input(s): TSH, T4TOTAL, T3FREE, THYROIDAB in the last 72 hours.  Invalid input(s): FREET3 ------------------------------------------------------------------------------------------------------------------ No results for input(s): VITAMINB12, FOLATE, FERRITIN, TIBC, IRON, RETICCTPCT in the last 72 hours.  Coagulation profile No results for input(s): INR, PROTIME in the last 168 hours.  No  results for input(s): DDIMER in the last 72 hours.  Cardiac Enzymes  Recent Labs Lab 10/06/15 1854  TROPONINI 0.03   ------------------------------------------------------------------------------------------------------------------    Component Value Date/Time   BNP 82.1 07/11/2014 1958    Inpatient Medications  Scheduled Meds: . aspirin  325 mg Oral Daily  . cefTRIAXone (ROCEPHIN)  IV  2 g Intravenous Q24H  . divalproex  750 mg Oral QHS  . docusate sodium  100 mg Oral BID  . enoxaparin (LOVENOX) injection  40 mg Subcutaneous Q24H  . feeding supplement (ENSURE ENLIVE)  237 mL Oral BID BM  . fluticasone  1 spray Each Nare BID  . pantoprazole sodium  40 mg Per Tube Daily  . QUEtiapine  25 mg Oral QHS  . sertraline  100 mg Oral Daily   Continuous Infusions: . sodium chloride     PRN Meds:.sodium chloride, acetaminophen, food thickener, HYDROcodone-acetaminophen, nitroGLYCERIN, ondansetron (ZOFRAN) IV  Micro Results Recent Results (from the past 240 hour(s))  Urine culture     Status: Abnormal (Preliminary result)   Collection Time: 10/06/15  6:20 PM  Result Value Ref Range Status   Specimen Description URINE, CATHETERIZED  Final   Special Requests NONE  Final   Culture >=100,000 COLONIES/mL ESCHERICHIA COLI (A)  Final   Report Status PENDING  Incomplete  Blood Culture (routine x 2)     Status: Abnormal (Preliminary result)   Collection Time: 10/06/15  6:47 PM  Result Value Ref Range Status   Specimen Description BLOOD RIGHT ARM  Final   Special Requests BOTTLES DRAWN AEROBIC AND ANAEROBIC 6CC  Final   Culture  Setup Time   Final    GRAM POSITIVE RODS CRITICAL RESULT CALLED TO, READ BACK BY AND VERIFIED WITH: HANEY,L AT 1100 BY HUFFINES,S ON 10/07/15 GRAM NEGATIVE RODS AEROBIC BOTTLE ONLY CORRECTED REPORT PREVIOUSLY REPORTED AS GRAM POSITIVE RODS CRITICAL RESULT CALLED TO, READ BACK BY AND VERIFIED WITH: Jonetta Osgood RN [id number] 10/07/15 A BROWNING Performed at [place] (A)  Final   Report Status PENDING  Incomplete  Blood Culture (routine x 2)     Status: Abnormal (Preliminary result)   Collection Time: 10/06/15  6:54 PM  Result Value Ref Range Status   Specimen Description BLOOD LEFT HAND  Final   Special Requests BOTTLES DRAWN AEROBIC AND ANAEROBIC 6CC  Final   Culture  Setup Time   Final    GRAM POSITIVE RODS CRITICAL RESULT CALLED TO, READ BACK BY AND VERIFIED WITH: JARRELL,W AT 1150 BY HUFFINES,S ON 10/07/15 GRAM NEGATIVE RODS IN BOTH AEROBIC AND ANAEROBIC BOTTLES CRITICAL RESULT CALLED TO, READ BACK BY AND VERIFIED WITH: Jonetta Osgood RN [id number] 10/07/15 A BROWNING Organism ID to follow CRITICAL RESULT CALLED TO, READ BACK BY AND VERIFIED WITH: Carleene Mains RN [id number] 10/07/15 A BROWNING Performed at [facility] (A)  Final   Report Status PENDING  Incomplete  Blood Culture ID Panel (Reflexed)     Status: Abnormal   Collection Time: 10/06/15  6:54 PM  Result Value Ref Range Status   Enterococcus species NOT DETECTED NOT DETECTED Final   Vancomycin resistance NOT DETECTED NOT DETECTED Final   Listeria monocytogenes NOT DETECTED NOT DETECTED Final   Staphylococcus species NOT DETECTED NOT DETECTED Final   Staphylococcus aureus NOT DETECTED NOT DETECTED Final   Methicillin resistance NOT DETECTED NOT DETECTED Final   Streptococcus species NOT DETECTED NOT DETECTED Final   Streptococcus agalactiae NOT DETECTED NOT DETECTED Final   Streptococcus pneumoniae NOT DETECTED NOT DETECTED Final   Streptococcus pyogenes NOT DETECTED NOT DETECTED Final   Acinetobacter baumannii NOT DETECTED NOT DETECTED Final   Enterobacteriaceae species DETECTED (A) NOT DETECTED Final    Comment: CRITICAL RESULT CALLED TO, READ BACK BY AND VERIFIED WITH: Jerilynn Mages MCDANIEL RN [id number] 10/07/15 A BROWNING    Enterobacter cloacae complex NOT DETECTED NOT DETECTED Final   Escherichia coli DETECTED (A) NOT DETECTED Final     Comment: CRITICAL RESULT CALLED TO, READ BACK BY AND VERIFIED WITH: Jerilynn Mages MCDANIEL RN [id number] 10/07/15 A BROWNING    Klebsiella oxytoca NOT DETECTED NOT DETECTED Final   Klebsiella pneumoniae NOT DETECTED NOT DETECTED Final   Proteus species NOT DETECTED NOT DETECTED Final   Serratia marcescens NOT DETECTED NOT DETECTED Final   Carbapenem resistance NOT DETECTED NOT DETECTED Final   Haemophilus influenzae NOT DETECTED NOT DETECTED Final   Neisseria meningitidis NOT DETECTED NOT DETECTED Final   Pseudomonas aeruginosa NOT DETECTED NOT DETECTED Final   Candida albicans NOT DETECTED NOT DETECTED Final   Candida glabrata NOT DETECTED NOT DETECTED Final   Candida krusei NOT DETECTED NOT DETECTED Final   Candida parapsilosis NOT DETECTED NOT DETECTED Final   Candida tropicalis NOT DETECTED NOT DETECTED Final    Comment: Performed at [facility]  MRSA PCR Screening     Status: None   Collection Time: 10/07/15 12:42 AM  Result Value Ref Range Status   MRSA by PCR NEGATIVE NEGATIVE Final    Comment:        The GeneXpert MRSA Assay (FDA approved for NASAL specimens only), is one component of a comprehensive MRSA colonization surveillance program. It is not intended to diagnose MRSA infection nor to guide or monitor treatment for MRSA infections.   Culture, blood (Routine X 2) w Reflex to ID Panel     Status: None (Preliminary result)   Collection Time: 10/08/15  4:29 AM  Result Value Ref Range Status   Specimen Description BLOOD LEFT ARM  Final   Special Requests BOTTLES DRAWN AEROBIC ONLY 4CC  Final   Culture NO GROWTH < 12 HOURS  Final   Report Status PENDING  Incomplete  Culture, blood (Routine X 2) w Reflex to ID Panel     Status: None (Preliminary result)   Collection Time: 10/08/15  8:04 AM  Result Value Ref Range Status   Specimen Description BLOOD RIGHT ARM  Final   Special Requests BOTTLES DRAWN AEROBIC AND ANAEROBIC 6CC  Final   Culture NO GROWTH < 12 HOURS  Final    Report Status PENDING  Incomplete    Radiology Reports Dg Chest Port 1 View  10/06/2015  CLINICAL DATA:  Acute onset of altered mental status. Code sepsis. Fever and lethargy. Initial encounter. EXAM: PORTABLE CHEST 1 VIEW COMPARISON:  Chest radiograph performed 09/17/2015 FINDINGS: The lungs are well-aerated. Mild  vascular congestion is noted, with mild left basilar opacity possibly reflecting atelectasis or mild pneumonia. There is no evidence of pleural effusion or pneumothorax. The cardiomediastinal silhouette is borderline normal in size. No acute osseous abnormalities are seen. IMPRESSION: Mild vascular congestion noted. Mild left basilar opacity may reflect atelectasis or mild pneumonia. Electronically Signed   By: Garald Balding M.D.   On: 10/06/2015 18:34   Dg Chest Port 1 View  09/17/2015  CLINICAL DATA:  Status post fall, left hip fracture EXAM: PORTABLE CHEST 1 VIEW COMPARISON:  09/06/2015 FINDINGS: The heart size and mediastinal contours are within normal limits. Both lungs are clear. Possible right eighth lateral rib fracture. Mild osteoarthritis of bilateral glenohumeral joints. IMPRESSION: No active disease. Possible right eighth lateral rib fracture. Electronically Signed   By: Kathreen Devoid   On: 09/17/2015 13:22   Dg Shoulder Left Port  09/20/2015  CLINICAL DATA:  Pain following fall EXAM: LEFT SHOULDER - 2 VIEW COMPARISON:  None. FINDINGS: Frontal and Y scapular images were obtained. There is no demonstrable acute fracture or dislocation. There is mild generalized osteoarthritic change with superior migration of the humeral head on the left. No erosive change. Visualized left lung clear. IMPRESSION: Mild generalized osteoarthritic change. Apparent chronic rotator cuff tear on the left with superior migration of the left humeral head. No acute fracture or dislocation. Electronically Signed   By: Lowella Grip III M.D.   On: 09/20/2015 13:44   Dg C-arm 61-120 Min  09/18/2015   CLINICAL DATA:  Left hip surgery. EXAM: DG C-ARM 61-120 MIN; LEFT FEMUR 2 VIEWS COMPARISON:  Radiographs earlier this day. FINDINGS: Four fluoroscopic spot images from the operating room during left femur ORIF demonstrate intra medullary rod with distal and proximal trans trochanteric screws traversing left proximal femur fracture. Fluoroscopy time not provided. IMPRESSION: Intraoperative fluoroscopic spot images post intra medullary rod and screw fixation of proximal left femur fracture. Electronically Signed   By: Jeb Levering M.D.   On: 09/18/2015 01:41   Dg Femur Min 2 Views Left  09/18/2015  CLINICAL DATA:  Left hip surgery. EXAM: DG C-ARM 61-120 MIN; LEFT FEMUR 2 VIEWS COMPARISON:  Radiographs earlier this day. FINDINGS: Four fluoroscopic spot images from the operating room during left femur ORIF demonstrate intra medullary rod with distal and proximal trans trochanteric screws traversing left proximal femur fracture. Fluoroscopy time not provided. IMPRESSION: Intraoperative fluoroscopic spot images post intra medullary rod and screw fixation of proximal left femur fracture. Electronically Signed   By: Jeb Levering M.D.   On: 09/18/2015 01:41   Dg Hips Bilat With Pelvis Min 5 Views  09/17/2015  CLINICAL DATA:  FALL, BILATERAL HIP PAIN, Pt brought in by EMS from [facility]. Pt fell from bed.on Friday 09/16/15,Nursing home note reports pt complaining of pain while being turned to left side, BEST IMAGES OBTAINED DUE TO PATIENTS CONDITION, PATIENT HELD FOR SEVERAL IMAGES. HISTORY OF HTN, STROKE, CAD, TIA, ALZHEIMER'S, DEMENTIA, CKD, INTRAMEDULLARY (IM) NAIL INTERTROCHANTERIC OF RIGHT ON 06/25/2015 EXAM: DG HIP (WITH OR WITHOUT PELVIS) 5+V BILAT COMPARISON:  06/25/2015 and previous FINDINGS: Sliding screw and IM rod transfix right intertrochanteric fracture in near anatomic alignment. The distal into the IM rod is not included. There is impacted midcervical fracture of the left femur. No dislocation.  Bony pelvis intact. Bilateral pelvic phleboliths. IMPRESSION: 1. Acute impacted midcervical left femur fracture. 2. Postop changes across the right femoral neck without acute abnormality. Electronically Signed   By: Eden Emms.D.  On: 09/17/2015 12:46    Time Spent in minutes  25 minutes   ELGERGAWY, DAWOOD M.D on 10/08/2015 at 2:01 PM  Between 7am to 7pm - Pager - [phone]  After 7pm go to [email or web address] - password [facility]  [facility] -  Office  [phone]

## 2015-10-09 DIAGNOSIS — F0390 Unspecified dementia without behavioral disturbance: Secondary | ICD-10-CM

## 2015-10-09 DIAGNOSIS — N179 Acute kidney failure, unspecified: Secondary | ICD-10-CM

## 2015-10-09 DIAGNOSIS — N183 Chronic kidney disease, stage 3 (moderate): Secondary | ICD-10-CM

## 2015-10-09 DIAGNOSIS — A4151 Sepsis due to Escherichia coli [E. coli]: Principal | ICD-10-CM

## 2015-10-09 LAB — CULTURE, BLOOD (ROUTINE X 2)

## 2015-10-09 LAB — BASIC METABOLIC PANEL
ANION GAP: 6 (ref 5–15)
BUN: 30 mg/dL — ABNORMAL HIGH (ref 6–20)
CHLORIDE: 110 mmol/L (ref 101–111)
CO2: 26 mmol/L (ref 22–32)
Calcium: 8.3 mg/dL — ABNORMAL LOW (ref 8.9–10.3)
Creatinine, Ser: 1.32 mg/dL — ABNORMAL HIGH (ref 0.61–1.24)
GFR calc non Af Amer: 48 mL/min — ABNORMAL LOW (ref >60)
GFR, EST AFRICAN AMERICAN: 55 mL/min — AB (ref >60)
GLUCOSE: 100 mg/dL — AB (ref 65–99)
POTASSIUM: 3.4 mmol/L — AB (ref 3.5–5.1)
Sodium: 142 mmol/L (ref 135–145)

## 2015-10-09 LAB — CBC
HEMATOCRIT: 32.7 % — AB (ref 39.0–52.0)
HEMOGLOBIN: 10.5 g/dL — AB (ref 13.0–17.0)
MCH: 28.7 pg (ref 26.0–34.0)
MCHC: 32.1 g/dL (ref 30.0–36.0)
MCV: 89.3 fL (ref 78.0–100.0)
Platelets: 278 10*3/uL (ref 150–400)
RBC: 3.66 MIL/uL — ABNORMAL LOW (ref 4.22–5.81)
RDW: 15.4 % (ref 11.5–15.5)
WBC: 14.9 10*3/uL — AB (ref 4.0–10.5)

## 2015-10-09 MED ORDER — IMIPENEM-CILASTATIN 500 MG IV SOLR
500.0000 mg | Freq: Three times a day (TID) | INTRAVENOUS | Status: DC
  Administered 2015-10-09 – 2015-10-10 (×4): 500 mg via INTRAVENOUS
  Filled 2015-10-09 (×7): qty 500

## 2015-10-09 NOTE — Progress Notes (Addendum)
PROGRESS NOTE                                                                                                                                                                                                             Patient Demographics:    David Collier, is a 82 y.o. male, DOB - [birth year], [id number]  Admit date - 10/06/2015   Admitting Physician Truett Mainland, DO  Outpatient Primary MD for the patient is Renata Caprice, DO  LOS - 3  Chief Complaint  Patient presents with  . Altered Mental Status       Brief Narrative   82 y.o. male with a history of coronary artery disease with stenting, stage III chronic kidney disease, vascular dementia, macular degeneration, history of myocardial infarction, status post stroke, headache.Presents with sepsis, most likely due to UTI And bacteremia.   Subjective:    David Collier today has, No headache, No chest pain, No abdominal pain , Reports he is feeling better today.   Assessment  & Plan :    Principal Problem:   Sepsis ([place]) Active Problems:   Essential hypertension   CAD (coronary artery disease), native coronary artery   Dementia   Hip fracture (HCC)   UTI (lower urinary tract infection)   Acute renal failure superimposed on stage 3 chronic kidney disease (HCC)   Bacteremia    Sepsis Secondary to UTI with ESBL Escherichia coli - Blood cultures have returned positive for Escherichia coli ESBL, urine with Escherichia coli with sensitivity still pending. -Antibiotics have been transitioned over to Primaxin as Rocephin was not adequately covering. - Physiology of sepsis appears to be improving, leukocytosis trending down, afebrile over last 24 hours, no further hypotension   UTI - Urine culture growing Escherichia coli, abx transitioned to primaxin, see above.  Acute renal failure on CKD stage III - Improving with IV fluids  Hypertension -  hold beta blockers giving sepsis and soft  blood pressure  CAD - Continue with aspirin  Dementia - Continue with home medication   Code Status : DNR  Family Communication  : Daughter at bedside  Disposition Plan  : Back to SNF when stable, in 1-2 days  Consults  :  none  Procedures  : none  DVT Prophylaxis  :  Lovenox   Lab Results  Component Value Date  PLT 278 10/09/2015    Antibiotics  :    Anti-infectives    Start     Dose/Rate Route Frequency Ordered Stop   10/09/15 1400  imipenem-cilastatin (PRIMAXIN) 500 mg in sodium chloride 0.9 % 100 mL IVPB     500 mg 200 mL/hr over 30 Minutes Intravenous Every 8 hours 10/09/15 1259     10/08/15 1400  cefTRIAXone (ROCEPHIN) 2 g in dextrose 5 % 50 mL IVPB  Status:  Discontinued     2 g 100 mL/hr over 30 Minutes Intravenous Every 24 hours 10/08/15 1223 10/09/15 1255   10/07/15 1600  vancomycin (VANCOCIN) 1,250 mg in sodium chloride 0.9 % 250 mL IVPB  Status:  Discontinued     1,250 mg 166.7 mL/hr over 90 Minutes Intravenous Every 24 hours 10/06/15 2201 10/08/15 1223   10/07/15 0200  piperacillin-tazobactam (ZOSYN) IVPB 3.375 g  Status:  Discontinued     3.375 g 12.5 mL/hr over 240 Minutes Intravenous Every 8 hours 10/06/15 2201 10/08/15 1223   10/06/15 1800  piperacillin-tazobactam (ZOSYN) IVPB 3.375 g     3.375 g 100 mL/hr over 30 Minutes Intravenous  Once 10/06/15 1758 10/06/15 1946   10/06/15 1800  vancomycin (VANCOCIN) IVPB 1000 mg/200 mL premix     1,000 mg 200 mL/hr over 60 Minutes Intravenous  Once 10/06/15 1758 10/06/15 1958        Objective:   Filed Vitals:   10/08/15 2205 10/09/15 0609 10/09/15 0700 10/09/15 1500  BP: 157/80 167/84 143/81 155/70  Pulse: 104 97 98 81  Temp: 97.4 F (36.3 C) 98.8 F (37.1 C)  98.7 F (37.1 C)  TempSrc: Axillary Axillary    Resp: 20 18  18   Height:      Weight:  80.1 kg (176 lb 9.4 oz)    SpO2: 97% 99%  100%    Wt Readings from Last 3 Encounters:  10/09/15 80.1 kg (176 lb 9.4 oz)  09/17/15 88.451 kg (195  lb)  09/06/15 88.451 kg (195 lb)     Intake/Output Summary (Last 24 hours) at 10/09/15 1630 Last data filed at 10/09/15 0600  Gross per 24 hour  Intake    975 ml  Output    650 ml  Net    325 ml     Physical Exam  Awake , Demented, confused Supple Neck,No JVD,  Symmetrical Chest wall movement, Good air movement bilaterally, CTAB RRR,No Gallops,Rubs or new Murmurs, No Parasternal Heave +ve B.Sounds, Abd Soft, No tenderness,  No Cyanosis, Clubbing or edema, No new Rash or bruise      Data Review:    CBC  Recent Labs Lab 10/06/15 1808 10/07/15 0507 10/08/15 0429 10/09/15 0548  WBC 21.3* 16.7* 11.9* 14.9*  HGB 10.1* 9.0* 9.9* 10.5*  HCT 30.4* 27.4* 31.3* 32.7*  PLT 274 193 142* 278  MCV 87.9 88.4 89.9 89.3  MCH 29.2 29.0 28.4 28.7  MCHC 33.2 32.8 31.6 32.1  RDW 15.2 15.3 15.5 15.4  LYMPHSABS 0.7  --   --   --   MONOABS 1.0  --   --   --   EOSABS 0.0  --   --   --   BASOSABS 0.0  --   --   --     Chemistries   Recent Labs Lab 10/06/15 1854 10/07/15 0507 10/08/15 0429 10/09/15 0548  NA 134* 138 140 142  K 3.7 3.8 4.1 3.4*  CL 106 109 113* 110  CO2 24 21* 20* 26  GLUCOSE 105* 115* 91 100*  BUN 45* 43* 39* 30*  CREATININE 1.74* 1.60* 1.36* 1.32*  CALCIUM 7.3* 7.7* 8.1* 8.3*  AST 10*  --   --   --   ALT 7*  --   --   --   ALKPHOS 128*  --   --   --   BILITOT 0.5  --   --   --    ------------------------------------------------------------------------------------------------------------------ No results for input(s): CHOL, HDL, LDLCALC, TRIG, CHOLHDL, LDLDIRECT in the last 72 hours.  Lab Results  Component Value Date   HGBA1C 5.5 03/05/2014   ------------------------------------------------------------------------------------------------------------------ No results for input(s): TSH, T4TOTAL, T3FREE, THYROIDAB in the last 72 hours.  Invalid input(s):  FREET3 ------------------------------------------------------------------------------------------------------------------ No results for input(s): VITAMINB12, FOLATE, FERRITIN, TIBC, IRON, RETICCTPCT in the last 72 hours.  Coagulation profile No results for input(s): INR, PROTIME in the last 168 hours.  No results for input(s): DDIMER in the last 72 hours.  Cardiac Enzymes  Recent Labs Lab 10/06/15 1854  TROPONINI 0.03   ------------------------------------------------------------------------------------------------------------------    Component Value Date/Time   BNP 82.1 07/11/2014 1958    Inpatient Medications  Scheduled Meds: . aspirin  325 mg Oral Daily  . divalproex  750 mg Oral QHS  . docusate sodium  100 mg Oral BID  . enoxaparin (LOVENOX) injection  40 mg Subcutaneous Q24H  . feeding supplement (ENSURE ENLIVE)  237 mL Oral BID BM  . fluticasone  1 spray Each Nare BID  . imipenem-cilastatin  500 mg Intravenous Q8H  . pantoprazole sodium  40 mg Per Tube Daily  . QUEtiapine  25 mg Oral QHS  . sertraline  100 mg Oral Daily   Continuous Infusions: . sodium chloride 75 mL/hr at 10/08/15 1900   PRN Meds:.sodium chloride, acetaminophen, food thickener, HYDROcodone-acetaminophen, nitroGLYCERIN, ondansetron (ZOFRAN) IV  Micro Results Recent Results (from the past 240 hour(s))  Urine culture     Status: Abnormal (Preliminary result)   Collection Time: 10/06/15  6:20 PM  Result Value Ref Range Status   Specimen Description URINE, CATHETERIZED  Final   Special Requests NONE  Final   Culture (A)  Final    >=100,000 COLONIES/mL ESCHERICHIA COLI REPEATING SENSITIVITIES Performed at [facility]    Report Status PENDING  Incomplete  Blood Culture (routine x 2)     Status: Abnormal   Collection Time: 10/06/15  6:47 PM  Result Value Ref Range Status   Specimen Description BLOOD RIGHT ARM  Final   Special Requests BOTTLES DRAWN AEROBIC AND ANAEROBIC 6CC  Final    Culture  Setup Time   Final    GRAM POSITIVE RODS CRITICAL RESULT CALLED TO, READ BACK BY AND VERIFIED WITH: HANEY,L AT 1100 BY HUFFINES,S ON 10/07/15 GRAM NEGATIVE RODS AEROBIC BOTTLE ONLY CORRECTED REPORT PREVIOUSLY REPORTED AS GRAM POSITIVE RODS CRITICAL RESULT CALLED TO, READ BACK BY AND VERIFIED WITH: Jonetta Osgood RN [id number] 10/07/15 A BROWNING    Culture (A)  Final    ESCHERICHIA COLI SUSCEPTIBILITIES PERFORMED ON PREVIOUS CULTURE WITHIN THE LAST 5 DAYS. Performed at [facility]    Report Status 10/09/2015 FINAL  Final  Blood Culture (routine x 2)     Status: Abnormal   Collection Time: 10/06/15  6:54 PM  Result Value Ref Range Status   Specimen Description BLOOD LEFT HAND  Final   Special Requests BOTTLES DRAWN AEROBIC AND ANAEROBIC 6CC  Final   Culture  Setup Time   Final    GRAM POSITIVE RODS CRITICAL  RESULT CALLED TO, READ BACK BY AND VERIFIED WITH: JARRELL,W AT 1150 BY HUFFINES,S ON 10/07/15 GRAM NEGATIVE RODS IN BOTH AEROBIC AND ANAEROBIC BOTTLES CRITICAL RESULT CALLED TO, READ BACK BY AND VERIFIED WITH: Jonetta Osgood RN [id number] 10/07/15 A BROWNING Organism ID to follow CRITICAL RESULT CALLED TO, READ BACK BY AND VERIFIED WITH: M [facility] RN [id number] 10/07/15 A BROWNING    Culture (A)  Final    ESCHERICHIA COLI Confirmed Extended Spectrum Beta-Lactamase Producer (ESBL) Performed at [facility]    Report Status 10/09/2015 FINAL  Final   Organism ID, Bacteria ESCHERICHIA COLI  Final      Susceptibility   Escherichia coli - MIC*    AMPICILLIN >=32 RESISTANT Resistant     CEFAZOLIN >=64 RESISTANT Resistant     CEFEPIME >=64 RESISTANT Resistant     CEFTAZIDIME 16 RESISTANT Resistant     CEFTRIAXONE >=64 RESISTANT Resistant     CIPROFLOXACIN >=4 RESISTANT Resistant     GENTAMICIN <=1 SENSITIVE Sensitive     IMIPENEM <=0.25 SENSITIVE Sensitive     TRIMETH/SULFA >=320 RESISTANT Resistant     AMPICILLIN/SULBACTAM >=32 RESISTANT Resistant     PIP/TAZO 16 SENSITIVE Sensitive      * ESCHERICHIA COLI  Blood Culture ID Panel (Reflexed)     Status: Abnormal   Collection Time: 10/06/15  6:54 PM  Result Value Ref Range Status   Enterococcus species NOT DETECTED NOT DETECTED Final   Vancomycin resistance NOT DETECTED NOT DETECTED Final   Listeria monocytogenes NOT DETECTED NOT DETECTED Final   Staphylococcus species NOT DETECTED NOT DETECTED Final   Staphylococcus aureus NOT DETECTED NOT DETECTED Final   Methicillin resistance NOT DETECTED NOT DETECTED Final   Streptococcus species NOT DETECTED NOT DETECTED Final   Streptococcus agalactiae NOT DETECTED NOT DETECTED Final   Streptococcus pneumoniae NOT DETECTED NOT DETECTED Final   Streptococcus pyogenes NOT DETECTED NOT DETECTED Final   Acinetobacter baumannii NOT DETECTED NOT DETECTED Final   Enterobacteriaceae species DETECTED (A) NOT DETECTED Final    Comment: CRITICAL RESULT CALLED TO, READ BACK BY AND VERIFIED WITH: Jerilynn Mages MCDANIEL RN [id number] 10/07/15 A BROWNING    Enterobacter cloacae complex NOT DETECTED NOT DETECTED Final   Escherichia coli DETECTED (A) NOT DETECTED Final    Comment: CRITICAL RESULT CALLED TO, READ BACK BY AND VERIFIED WITH: Jerilynn Mages MCDANIEL RN [id number] 10/07/15 A BROWNING    Klebsiella oxytoca NOT DETECTED NOT DETECTED Final   Klebsiella pneumoniae NOT DETECTED NOT DETECTED Final   Proteus species NOT DETECTED NOT DETECTED Final   Serratia marcescens NOT DETECTED NOT DETECTED Final   Carbapenem resistance NOT DETECTED NOT DETECTED Final   Haemophilus influenzae NOT DETECTED NOT DETECTED Final   Neisseria meningitidis NOT DETECTED NOT DETECTED Final   Pseudomonas aeruginosa NOT DETECTED NOT DETECTED Final   Candida albicans NOT DETECTED NOT DETECTED Final   Candida glabrata NOT DETECTED NOT DETECTED Final   Candida krusei NOT DETECTED NOT DETECTED Final   Candida parapsilosis NOT DETECTED NOT DETECTED Final   Candida tropicalis NOT DETECTED NOT DETECTED Final    Comment: Performed at [facility]    MRSA PCR Screening     Status: None   Collection Time: 10/07/15 12:42 AM  Result Value Ref Range Status   MRSA by PCR NEGATIVE NEGATIVE Final    Comment:        The GeneXpert MRSA Assay (FDA approved for NASAL specimens only), is one component of a comprehensive MRSA colonization surveillance program. It is  not intended to diagnose MRSA infection nor to guide or monitor treatment for MRSA infections.   Culture, blood (Routine X 2) w Reflex to ID Panel     Status: None (Preliminary result)   Collection Time: 10/08/15  4:29 AM  Result Value Ref Range Status   Specimen Description BLOOD LEFT ARM  Final   Special Requests BOTTLES DRAWN AEROBIC ONLY 4CC  Final   Culture NO GROWTH 1 DAY  Final   Report Status PENDING  Incomplete  Culture, blood (Routine X 2) w Reflex to ID Panel     Status: None (Preliminary result)   Collection Time: 10/08/15  8:04 AM  Result Value Ref Range Status   Specimen Description BLOOD RIGHT ARM  Final   Special Requests BOTTLES DRAWN AEROBIC AND ANAEROBIC 6CC  Final   Culture NO GROWTH 1 DAY  Final   Report Status PENDING  Incomplete    Radiology Reports Dg Chest Port 1 View  10/06/2015  CLINICAL DATA:  Acute onset of altered mental status. Code sepsis. Fever and lethargy. Initial encounter. EXAM: PORTABLE CHEST 1 VIEW COMPARISON:  Chest radiograph performed 09/17/2015 FINDINGS: The lungs are well-aerated. Mild vascular congestion is noted, with mild left basilar opacity possibly reflecting atelectasis or mild pneumonia. There is no evidence of pleural effusion or pneumothorax. The cardiomediastinal silhouette is borderline normal in size. No acute osseous abnormalities are seen. IMPRESSION: Mild vascular congestion noted. Mild left basilar opacity may reflect atelectasis or mild pneumonia. Electronically Signed   By: Garald Balding M.D.   On: 10/06/2015 18:34   Dg Chest Port 1 View  09/17/2015  CLINICAL DATA:  Status post fall, left hip fracture EXAM:  PORTABLE CHEST 1 VIEW COMPARISON:  09/06/2015 FINDINGS: The heart size and mediastinal contours are within normal limits. Both lungs are clear. Possible right eighth lateral rib fracture. Mild osteoarthritis of bilateral glenohumeral joints. IMPRESSION: No active disease. Possible right eighth lateral rib fracture. Electronically Signed   By: Kathreen Devoid   On: 09/17/2015 13:22   Dg Shoulder Left Port  09/20/2015  CLINICAL DATA:  Pain following fall EXAM: LEFT SHOULDER - 2 VIEW COMPARISON:  None. FINDINGS: Frontal and Y scapular images were obtained. There is no demonstrable acute fracture or dislocation. There is mild generalized osteoarthritic change with superior migration of the humeral head on the left. No erosive change. Visualized left lung clear. IMPRESSION: Mild generalized osteoarthritic change. Apparent chronic rotator cuff tear on the left with superior migration of the left humeral head. No acute fracture or dislocation. Electronically Signed   By: Lowella Grip III M.D.   On: 09/20/2015 13:44   Dg C-arm 61-120 Min  09/18/2015  CLINICAL DATA:  Left hip surgery. EXAM: DG C-ARM 61-120 MIN; LEFT FEMUR 2 VIEWS COMPARISON:  Radiographs earlier this day. FINDINGS: Four fluoroscopic spot images from the operating room during left femur ORIF demonstrate intra medullary rod with distal and proximal trans trochanteric screws traversing left proximal femur fracture. Fluoroscopy time not provided. IMPRESSION: Intraoperative fluoroscopic spot images post intra medullary rod and screw fixation of proximal left femur fracture. Electronically Signed   By: Jeb Levering M.D.   On: 09/18/2015 01:41   Dg Femur Min 2 Views Left  09/18/2015  CLINICAL DATA:  Left hip surgery. EXAM: DG C-ARM 61-120 MIN; LEFT FEMUR 2 VIEWS COMPARISON:  Radiographs earlier this day. FINDINGS: Four fluoroscopic spot images from the operating room during left femur ORIF demonstrate intra medullary rod with distal and proximal  trans trochanteric  screws traversing left proximal femur fracture. Fluoroscopy time not provided. IMPRESSION: Intraoperative fluoroscopic spot images post intra medullary rod and screw fixation of proximal left femur fracture. Electronically Signed   By: Jeb Levering M.D.   On: 09/18/2015 01:41   Dg Hips Bilat With Pelvis Min 5 Views  09/17/2015  CLINICAL DATA:  FALL, BILATERAL HIP PAIN, Pt brought in by EMS from [facility] Pt fell from bed.on Friday 09/16/15,Nursing home note reports pt complaining of pain while being turned to left side, BEST IMAGES OBTAINED DUE TO PATIENTS CONDITION, PATIENT HELD FOR SEVERAL IMAGES. HISTORY OF HTN, STROKE, CAD, TIA, ALZHEIMER'S, DEMENTIA, CKD, INTRAMEDULLARY (IM) NAIL INTERTROCHANTERIC OF RIGHT ON 06/25/2015 EXAM: DG HIP (WITH OR WITHOUT PELVIS) 5+V BILAT COMPARISON:  06/25/2015 and previous FINDINGS: Sliding screw and IM rod transfix right intertrochanteric fracture in near anatomic alignment. The distal into the IM rod is not included. There is impacted midcervical fracture of the left femur. No dislocation. Bony pelvis intact. Bilateral pelvic phleboliths. IMPRESSION: 1. Acute impacted midcervical left femur fracture. 2. Postop changes across the right femoral neck without acute abnormality. Electronically Signed   By: Lucrezia Europe M.D.   On: 09/17/2015 12:46    Time Spent in minutes  25 minutes   Lelon Frohlich M.D on 10/09/2015 at 4:30 PM  Between 7am to 7pm - Pager - [phone]  After 7pm go to [email or web address] - password [facility]  [facility] -  Office  [phone]

## 2015-10-09 NOTE — Progress Notes (Signed)
Pharmacy Note:  Initial antibiotics for PRIMAXIN ordered by EDP for ESBL infection / bacteremia / UTI.  Estimated Creatinine Clearance: 41.7 mL/min (by C-G formula based on Cr of 1.32).   Allergies  Allergen Reactions  . Aricept [Donepezil Hcl] Other (See Comments)    Altered mental status  . Ativan [Lorazepam] Other (See Comments)    Altered mental status  . Atorvastatin Nausea And Vomiting and Other (See Comments)    Joint pain  . Namenda [Memantine] Other (See Comments)    Altered mental status  . Other     Wife says pt was given 2 different medications for his nerves but said they made him have SI.  Marland Kitchen Simvastatin Nausea And Vomiting and Other (See Comments)    Joint pain  . Sorbitan Nausea And Vomiting    headache    Filed Vitals:   10/09/15 0609 10/09/15 0700  BP: 167/84 143/81  Pulse: 97 98  Temp: 98.8 F (37.1 C)   Resp: 18     Anti-infectives    Start     Dose/Rate Route Frequency Ordered Stop   10/09/15 1400  imipenem-cilastatin (PRIMAXIN) 500 mg in sodium chloride 0.9 % 100 mL IVPB     500 mg 200 mL/hr over 30 Minutes Intravenous Every 8 hours 10/09/15 1259     10/08/15 1400  cefTRIAXone (ROCEPHIN) 2 g in dextrose 5 % 50 mL IVPB  Status:  Discontinued     2 g 100 mL/hr over 30 Minutes Intravenous Every 24 hours 10/08/15 1223 10/09/15 1255   10/07/15 1600  vancomycin (VANCOCIN) 1,250 mg in sodium chloride 0.9 % 250 mL IVPB  Status:  Discontinued     1,250 mg 166.7 mL/hr over 90 Minutes Intravenous Every 24 hours 10/06/15 2201 10/08/15 1223   10/07/15 0200  piperacillin-tazobactam (ZOSYN) IVPB 3.375 g  Status:  Discontinued     3.375 g 12.5 mL/hr over 240 Minutes Intravenous Every 8 hours 10/06/15 2201 10/08/15 1223   10/06/15 1800  piperacillin-tazobactam (ZOSYN) IVPB 3.375 g     3.375 g 100 mL/hr over 30 Minutes Intravenous  Once 10/06/15 1758 10/06/15 1946   10/06/15 1800  vancomycin (VANCOCIN) IVPB 1000 mg/200 mL premix     1,000 mg 200 mL/hr over 60  Minutes Intravenous  Once 10/06/15 1758 10/06/15 1958     Plan:  Primaxin 500mg  IV q8hrs (renal fxn borderline for dose adjustment) Monitor labs, renal fxn, progress and c/s Duration of therapy per MD F/U recommendations from ID service  Hart Robinsons, PharmD Clinical Pharmacist Pager:  [phone] 10/09/2015 1:04 PM

## 2015-10-09 NOTE — Progress Notes (Signed)
PHARMACY - PHYSICIAN COMMUNICATION CRITICAL VALUE ALERT - BLOOD CULTURE IDENTIFICATION (BCID)  Name of physician (or Provider) Contacted / D/W: Dr Waldron Labs and Jerilee Hoh  Changes to prescribed antibiotics required:  PRIMAXIN  Recent Results (from the past 240 hour(s))  Urine culture     Status: Abnormal (Preliminary result)   Collection Time: 10/06/15  6:20 PM  Result Value Ref Range Status   Specimen Description URINE, CATHETERIZED  Final   Special Requests NONE  Final   Culture (A)  Final    >=100,000 COLONIES/mL ESCHERICHIA COLI REPEATING SENSITIVITIES Performed at [facility]    Report Status PENDING  Incomplete  Blood Culture (routine x 2)     Status: Abnormal   Collection Time: 10/06/15  6:47 PM  Result Value Ref Range Status   Specimen Description BLOOD RIGHT ARM  Final   Special Requests BOTTLES DRAWN AEROBIC AND ANAEROBIC 6CC  Final   Culture  Setup Time   Final    GRAM POSITIVE RODS CRITICAL RESULT CALLED TO, READ BACK BY AND VERIFIED WITH: HANEY,L AT 1100 BY HUFFINES,S ON 10/07/15 GRAM NEGATIVE RODS AEROBIC BOTTLE ONLY CORRECTED REPORT PREVIOUSLY REPORTED AS GRAM POSITIVE RODS CRITICAL RESULT CALLED TO, READ BACK BY AND VERIFIED WITH: Jonetta Osgood RN [id number] 10/07/15 A BROWNING    Culture (A)  Final    ESCHERICHIA COLI SUSCEPTIBILITIES PERFORMED ON PREVIOUS CULTURE WITHIN THE LAST 5 DAYS. Performed at [facility]    Report Status 10/09/2015 FINAL  Final  Blood Culture (routine x 2)     Status: Abnormal   Collection Time: 10/06/15  6:54 PM  Result Value Ref Range Status   Specimen Description BLOOD LEFT HAND  Final   Special Requests BOTTLES DRAWN AEROBIC AND ANAEROBIC 6CC  Final   Culture  Setup Time   Final    GRAM POSITIVE RODS CRITICAL RESULT CALLED TO, READ BACK BY AND VERIFIED WITH: JARRELL,W AT 1150 BY HUFFINES,S ON 10/07/15 GRAM NEGATIVE RODS IN BOTH AEROBIC AND ANAEROBIC BOTTLES CRITICAL RESULT CALLED TO, READ BACK BY AND VERIFIED WITH: Jonetta Osgood  RN [id number] 10/07/15 A BROWNING Organism ID to follow CRITICAL RESULT CALLED TO, READ BACK BY AND VERIFIED WITH: Carleene Mains RN [id number] 10/07/15 A BROWNING    Culture (A)  Final    ESCHERICHIA COLI Confirmed Extended Spectrum Beta-Lactamase Producer (ESBL) Performed at [facility]    Report Status 10/09/2015 FINAL  Final   Organism ID, Bacteria ESCHERICHIA COLI  Final      Susceptibility   Escherichia coli - MIC*    AMPICILLIN >=32 RESISTANT Resistant     CEFAZOLIN >=64 RESISTANT Resistant     CEFEPIME >=64 RESISTANT Resistant     CEFTAZIDIME 16 RESISTANT Resistant     CEFTRIAXONE >=64 RESISTANT Resistant     CIPROFLOXACIN >=4 RESISTANT Resistant     GENTAMICIN <=1 SENSITIVE Sensitive     IMIPENEM <=0.25 SENSITIVE Sensitive     TRIMETH/SULFA >=320 RESISTANT Resistant     AMPICILLIN/SULBACTAM >=32 RESISTANT Resistant     PIP/TAZO 16 SENSITIVE Sensitive     * ESCHERICHIA COLI  Blood Culture ID Panel (Reflexed)     Status: Abnormal   Collection Time: 10/06/15  6:54 PM  Result Value Ref Range Status   Enterococcus species NOT DETECTED NOT DETECTED Final   Vancomycin resistance NOT DETECTED NOT DETECTED Final   Listeria monocytogenes NOT DETECTED NOT DETECTED Final   Staphylococcus species NOT DETECTED NOT DETECTED Final   Staphylococcus aureus NOT DETECTED NOT DETECTED Final  Methicillin resistance NOT DETECTED NOT DETECTED Final   Streptococcus species NOT DETECTED NOT DETECTED Final   Streptococcus agalactiae NOT DETECTED NOT DETECTED Final   Streptococcus pneumoniae NOT DETECTED NOT DETECTED Final   Streptococcus pyogenes NOT DETECTED NOT DETECTED Final   Acinetobacter baumannii NOT DETECTED NOT DETECTED Final   Enterobacteriaceae species DETECTED (A) NOT DETECTED Final    Comment: CRITICAL RESULT CALLED TO, READ BACK BY AND VERIFIED WITH: Jerilynn Mages MCDANIEL RN [id number] 10/07/15 A BROWNING    Enterobacter cloacae complex NOT DETECTED NOT DETECTED Final   Escherichia coli DETECTED (A) NOT  DETECTED Final    Comment: CRITICAL RESULT CALLED TO, READ BACK BY AND VERIFIED WITH: Jerilynn Mages MCDANIEL RN [id number] 10/07/15 A BROWNING    Klebsiella oxytoca NOT DETECTED NOT DETECTED Final   Klebsiella pneumoniae NOT DETECTED NOT DETECTED Final   Proteus species NOT DETECTED NOT DETECTED Final   Serratia marcescens NOT DETECTED NOT DETECTED Final   Carbapenem resistance NOT DETECTED NOT DETECTED Final   Haemophilus influenzae NOT DETECTED NOT DETECTED Final   Neisseria meningitidis NOT DETECTED NOT DETECTED Final   Pseudomonas aeruginosa NOT DETECTED NOT DETECTED Final   Candida albicans NOT DETECTED NOT DETECTED Final   Candida glabrata NOT DETECTED NOT DETECTED Final   Candida krusei NOT DETECTED NOT DETECTED Final   Candida parapsilosis NOT DETECTED NOT DETECTED Final   Candida tropicalis NOT DETECTED NOT DETECTED Final    Comment: Performed at [facility]  MRSA PCR Screening     Status: None   Collection Time: 10/07/15 12:42 AM  Result Value Ref Range Status   MRSA by PCR NEGATIVE NEGATIVE Final    Comment:        The GeneXpert MRSA Assay (FDA approved for NASAL specimens only), is one component of a comprehensive MRSA colonization surveillance program. It is not intended to diagnose MRSA infection nor to guide or monitor treatment for MRSA infections.   Culture, blood (Routine X 2) w Reflex to ID Panel     Status: None (Preliminary result)   Collection Time: 10/08/15  4:29 AM  Result Value Ref Range Status   Specimen Description BLOOD LEFT ARM  Final   Special Requests BOTTLES DRAWN AEROBIC ONLY 4CC  Final   Culture NO GROWTH 1 DAY  Final   Report Status PENDING  Incomplete  Culture, blood (Routine X 2) w Reflex to ID Panel     Status: None (Preliminary result)   Collection Time: 10/08/15  8:04 AM  Result Value Ref Range Status   Specimen Description BLOOD RIGHT ARM  Final   Special Requests BOTTLES DRAWN AEROBIC AND ANAEROBIC [place]  Final   Culture NO GROWTH 1 DAY  Final    Report Status PENDING  Incomplete   Hart Robinsons, PharmD Clinical Pharmacist Pager:  [phone] 10/09/2015 1:00 PM

## 2015-10-10 ENCOUNTER — Inpatient Hospital Stay (HOSPITAL_COMMUNITY): Payer: Medicare Other

## 2015-10-10 DIAGNOSIS — S72141D Displaced intertrochanteric fracture of right femur, subsequent encounter for closed fracture with routine healing: Secondary | ICD-10-CM | POA: Diagnosis not present

## 2015-10-10 DIAGNOSIS — Z7401 Bed confinement status: Secondary | ICD-10-CM | POA: Diagnosis not present

## 2015-10-10 DIAGNOSIS — R1312 Dysphagia, oropharyngeal phase: Secondary | ICD-10-CM | POA: Diagnosis not present

## 2015-10-10 DIAGNOSIS — N179 Acute kidney failure, unspecified: Secondary | ICD-10-CM | POA: Diagnosis not present

## 2015-10-10 DIAGNOSIS — R1310 Dysphagia, unspecified: Secondary | ICD-10-CM | POA: Diagnosis not present

## 2015-10-10 DIAGNOSIS — S72002D Fracture of unspecified part of neck of left femur, subsequent encounter for closed fracture with routine healing: Secondary | ICD-10-CM | POA: Diagnosis not present

## 2015-10-10 DIAGNOSIS — F0390 Unspecified dementia without behavioral disturbance: Secondary | ICD-10-CM | POA: Diagnosis not present

## 2015-10-10 DIAGNOSIS — A498 Other bacterial infections of unspecified site: Secondary | ICD-10-CM | POA: Diagnosis not present

## 2015-10-10 DIAGNOSIS — R278 Other lack of coordination: Secondary | ICD-10-CM | POA: Diagnosis not present

## 2015-10-10 DIAGNOSIS — Z452 Encounter for adjustment and management of vascular access device: Secondary | ICD-10-CM | POA: Diagnosis not present

## 2015-10-10 DIAGNOSIS — S72042D Displaced fracture of base of neck of left femur, subsequent encounter for closed fracture with routine healing: Secondary | ICD-10-CM | POA: Diagnosis not present

## 2015-10-10 DIAGNOSIS — E785 Hyperlipidemia, unspecified: Secondary | ICD-10-CM | POA: Diagnosis not present

## 2015-10-10 DIAGNOSIS — A419 Sepsis, unspecified organism: Secondary | ICD-10-CM | POA: Diagnosis not present

## 2015-10-10 DIAGNOSIS — I10 Essential (primary) hypertension: Secondary | ICD-10-CM | POA: Diagnosis not present

## 2015-10-10 DIAGNOSIS — M6281 Muscle weakness (generalized): Secondary | ICD-10-CM | POA: Diagnosis not present

## 2015-10-10 DIAGNOSIS — T50905D Adverse effect of unspecified drugs, medicaments and biological substances, subsequent encounter: Secondary | ICD-10-CM | POA: Diagnosis not present

## 2015-10-10 DIAGNOSIS — R042 Hemoptysis: Secondary | ICD-10-CM | POA: Diagnosis not present

## 2015-10-10 DIAGNOSIS — Z8673 Personal history of transient ischemic attack (TIA), and cerebral infarction without residual deficits: Secondary | ICD-10-CM | POA: Diagnosis not present

## 2015-10-10 DIAGNOSIS — M2550 Pain in unspecified joint: Secondary | ICD-10-CM | POA: Diagnosis not present

## 2015-10-10 DIAGNOSIS — N183 Chronic kidney disease, stage 3 (moderate): Secondary | ICD-10-CM | POA: Diagnosis not present

## 2015-10-10 DIAGNOSIS — Z79899 Other long term (current) drug therapy: Secondary | ICD-10-CM | POA: Diagnosis not present

## 2015-10-10 DIAGNOSIS — I2510 Atherosclerotic heart disease of native coronary artery without angina pectoris: Secondary | ICD-10-CM | POA: Diagnosis not present

## 2015-10-10 DIAGNOSIS — N390 Urinary tract infection, site not specified: Secondary | ICD-10-CM | POA: Diagnosis not present

## 2015-10-10 DIAGNOSIS — I672 Cerebral atherosclerosis: Secondary | ICD-10-CM | POA: Diagnosis not present

## 2015-10-10 DIAGNOSIS — G4700 Insomnia, unspecified: Secondary | ICD-10-CM | POA: Diagnosis not present

## 2015-10-10 DIAGNOSIS — A4151 Sepsis due to Escherichia coli [E. coli]: Secondary | ICD-10-CM | POA: Diagnosis not present

## 2015-10-10 DIAGNOSIS — D62 Acute posthemorrhagic anemia: Secondary | ICD-10-CM | POA: Diagnosis not present

## 2015-10-10 DIAGNOSIS — I200 Unstable angina: Secondary | ICD-10-CM | POA: Diagnosis not present

## 2015-10-10 DIAGNOSIS — H3530 Unspecified macular degeneration: Secondary | ICD-10-CM | POA: Diagnosis not present

## 2015-10-10 DIAGNOSIS — F4489 Other dissociative and conversion disorders: Secondary | ICD-10-CM | POA: Diagnosis not present

## 2015-10-10 LAB — BASIC METABOLIC PANEL
Anion gap: 3 — ABNORMAL LOW (ref 5–15)
BUN: 25 mg/dL — AB (ref 6–20)
CHLORIDE: 114 mmol/L — AB (ref 101–111)
CO2: 25 mmol/L (ref 22–32)
CREATININE: 1.11 mg/dL (ref 0.61–1.24)
Calcium: 8 mg/dL — ABNORMAL LOW (ref 8.9–10.3)
GFR calc Af Amer: >60 mL/min (ref >60)
GFR calc non Af Amer: 59 mL/min — ABNORMAL LOW (ref >60)
Glucose, Bld: 90 mg/dL (ref 65–99)
Potassium: 3.3 mmol/L — ABNORMAL LOW (ref 3.5–5.1)
Sodium: 142 mmol/L (ref 135–145)

## 2015-10-10 LAB — CBC
HCT: 31.3 % — ABNORMAL LOW (ref 39.0–52.0)
Hemoglobin: 9.9 g/dL — ABNORMAL LOW (ref 13.0–17.0)
MCH: 28.3 pg (ref 26.0–34.0)
MCHC: 31.6 g/dL (ref 30.0–36.0)
MCV: 89.4 fL (ref 78.0–100.0)
PLATELETS: 267 10*3/uL (ref 150–400)
RBC: 3.5 MIL/uL — ABNORMAL LOW (ref 4.22–5.81)
RDW: 15.7 % — AB (ref 11.5–15.5)
WBC: 12.9 10*3/uL — ABNORMAL HIGH (ref 4.0–10.5)

## 2015-10-10 LAB — URINE CULTURE

## 2015-10-10 MED ORDER — SODIUM CHLORIDE 0.9 % IV SOLN: 500.0000 mg | Freq: Three times a day (TID) | INTRAVENOUS | Status: AC

## 2015-10-10 MED ORDER — DIVALPROEX SODIUM 125 MG PO CSDR: 750.0000 mg | DELAYED_RELEASE_CAPSULE | Freq: Every day | ORAL | Status: DC

## 2015-10-10 MED ORDER — LORAZEPAM 2 MG/ML IJ SOLN
1.0000 mg | Freq: Once | INTRAMUSCULAR | Status: AC
  Administered 2015-10-10: 1 mg via INTRAVENOUS
  Filled 2015-10-10: qty 1

## 2015-10-10 MED ORDER — DIATRIZOATE MEGLUMINE & SODIUM 66-10 % PO SOLN
ORAL | Status: AC
  Administered 2015-10-10: 12:00:00
  Filled 2015-10-10: qty 30

## 2015-10-10 NOTE — Progress Notes (Signed)
Patient transferred to [facility] via ems, family at bedside.

## 2015-10-10 NOTE — NC FL2 (Signed)
[place] LEVEL OF CARE SCREENING TOOL     IDENTIFICATION  Patient Name: David Collier Birthdate: 1928-04-28 Sex: male Admission Date (Current Location): 10/06/2015  [facility] and [place] Number:  [employer] and Address:  [place] [address] [place]      Provider Number: [id number]  Attending Physician Name and Address:  Koleen Nimrod Acost*  Relative Name and Phone Number:       Current Level of Care: Hospital Recommended Level of Care: [place] Prior Approval Number:    Date Approved/Denied:   PASRR Number:  ([id number])  Discharge Plan: SNF    Current Diagnoses: Patient Active Problem List   Diagnosis Date Noted  . Bacteremia 10/08/2015  . Sepsis ([place]) 10/06/2015  . UTI (lower urinary tract infection) 10/06/2015  . Acute renal failure superimposed on stage 3 chronic kidney disease ([place]) 10/06/2015  . Acute blood loss anemia 09/18/2015  . Closed left hip fracture ([place]) 09/17/2015  . Trochanteric fracture of right femur ([place]) 06/24/2015  . Community acquired pneumonia 06/24/2015  . Hip fracture ([place]) 06/24/2015  . Obesity (BMI 30-39.9) 07/11/2014  . Dementia 07/11/2014  . Unstable angina pectoris ([place]) 07/11/2014  . 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 CKD (chronic kidney disease), stage III 03/05/2014  . Macular degeneration (senile) of retina 02/16/2009  . Essential hypertension 02/16/2009  . Hyperlipidemia   . CAD (coronary artery disease), native coronary artery     Orientation RESPIRATION BLADDER Height & Weight     Self  Normal Incontinent Weight: 181 lb 10.5 oz (82.4 kg) Height:  5\' 9"  (175.3 cm)  BEHAVIORAL SYMPTOMS/MOOD NEUROLOGICAL BOWEL NUTRITION STATUS  Other (Comment) (n/a)   Incontinent Diet (Dysphagia 1 with nectar thick liquids.  100% supervision. Small bites alternate with NTL. Meds crushed. Low sodium heart healthy.)  AMBULATORY STATUS COMMUNICATION OF NEEDS Skin   Total Care Verbally Other (Comment) (Abrasion- penis)                       Personal Care Assistance Level of Assistance  Total care           Functional Limitations Info  Sight, Hearing, Speech Sight Info: Impaired Hearing Info: Adequate Speech Info: Adequate    SPECIAL CARE FACTORS FREQUENCY                       Contractures      Additional Factors Info  Code Status, Isolation Precautions Code Status Info: DNR Allergies Info: Anricept, Ativan, Atrovastatin, Namenda, Other, Simvastatin, Sorbitan Psychotropic Info: Depakote Sprinkle, Seroquel, Zoloft   Isolation Precautions Info: 10/06/15 Blood [place] = E. Coli ESBL (cj)     Current Medications (10/10/2015):  This is the current hospital active medication list Current Facility-Administered Medications  Medication Dose Route Frequency Provider Last Rate Last Dose  . 0.9 %  sodium chloride infusion  250 mL Intravenous PRN Tanna Savoy Stinson, DO 10 mL/hr at 10/08/15 0600 250 mL at 10/08/15 0600  . 0.9 %  sodium chloride infusion   Intravenous Continuous Albertine Patricia, MD 75 mL/hr at 10/09/15 2228    . acetaminophen (TYLENOL) tablet 650 mg  650 mg Oral Q6H PRN Tanna Savoy Stinson, DO   650 mg at 10/09/15 0910  . aspirin tablet 325 mg  325 mg Oral Daily Albertine Patricia, MD  325 mg at 10/10/15 1000  . diatrizoate meglumine-sodium (GASTROGRAFIN) 66-10 % solution           . divalproex (DEPAKOTE SPRINKLE) capsule 750 mg  750 mg Oral QHS Albertine Patricia, MD   750 mg at 10/09/15 2225  . docusate sodium (COLACE) capsule 100 mg  100 mg Oral BID Tanna Savoy Stinson, DO   100 mg at 10/10/15 1000  . enoxaparin (LOVENOX) injection 40 mg  40 mg Subcutaneous Q24H Tanna Savoy Stinson, DO   40 mg at 10/09/15 0911  . feeding supplement (ENSURE ENLIVE) (ENSURE ENLIVE) liquid 237 mL  237 mL Oral BID  BM Tanna Savoy Stinson, DO   237 mL at 10/10/15 1000  . fluticasone (FLONASE) 50 MCG/ACT nasal spray 1 spray  1 spray Each Nare BID Tanna Savoy Stinson, DO   1 spray at 10/10/15 1000  . food thickener (THICK IT) powder   Oral PRN Albertine Patricia, MD      . HYDROcodone-acetaminophen (NORCO/VICODIN) 5-325 MG per tablet 1 tablet  1 tablet Oral Q6H PRN Tanna Savoy Stinson, DO      . imipenem-cilastatin (PRIMAXIN) 500 mg in sodium chloride 0.9 % 100 mL IVPB  500 mg Intravenous Q8H Estela Leonie Green, MD   500 mg at 10/10/15 0600  . nitroGLYCERIN (NITROSTAT) SL tablet 0.4 mg  0.4 mg Sublingual Q5 min PRN Tanna Savoy Stinson, DO      . ondansetron [facility]) injection 4 mg  4 mg Intravenous Q6H PRN Tanna Savoy Stinson, DO      . pantoprazole sodium (PROTONIX) 40 mg/20 mL oral suspension 40 mg  40 mg Per Tube Daily Albertine Patricia, MD   40 mg at 10/10/15 1000  . QUEtiapine (SEROQUEL) tablet 25 mg  25 mg Oral QHS Tanna Savoy Stinson, DO   25 mg at 10/09/15 2226  . sertraline (ZOLOFT) tablet 100 mg  100 mg Oral Daily Tanna Savoy Stinson, DO   100 mg at 10/10/15 1000     Discharge Medications: Please see discharge summary for a list of discharge medications.  Relevant Imaging Results:  Relevant Lab Results:   Additional Information   PICC line for IV antibiotics.   Benay Pike Tarnov, [place]

## 2015-10-10 NOTE — Discharge Summary (Addendum)
Physician Discharge Summary  David Collier [id number] DOB: 07-18-1928 DOA: 10/06/2015  PCP: Renata Caprice, DO  Admit date: 10/06/2015 Discharge date: 10/10/2015  Time spent: 45 minutes  Recommendations for Outpatient Follow-up:  -Will be discharged back to [facility] today. -To continue Primaxin until July 3rd. -Family prefers patient to use condom cath at [facility] if possible.   Discharge Diagnoses:  Principal Problem:   Sepsis ([place]) Active Problems:   Essential hypertension   CAD (coronary artery disease), native coronary artery   Dementia   Hip fracture (H[facility])   UTI (lower urinary tract infection)   Acute renal failure superimposed on stage 3 chronic kidney disease ([place])   Bacteremia   Discharge Condition: Stable  Filed Weights   10/08/15 0400 10/09/15 0609 10/10/15 [id number]  Weight: 91.4 kg (201 lb 8 oz) 80.1 kg (176 lb 9.4 oz) 82.4 kg (181 lb 10.5 oz)    History of present illness:  As per Dr. Nehemiah Settle on 6/15: David Collier is a 82 y.o. male with a history of coronary artery disease with stenting, stage III chronic kidney disease, vascular dementia, macular degeneration, history of myocardial infarction, status post stroke, headache. Patient was sent here from [place] home due to concerns of sepsis the patient extremely confused and initially unresponsive. His symptoms improved after fluid resuscitation and measures in the emergency department. The history is extremely limited due to the dementia and altered mental status. Patient has no current complaints.  Of note, the patient did have a left hip ORIF after a basicervical femoral neck fracture. This was done on 09/18/15.  Hospital Course:   Sepsis Secondary to UTI with ESBL Escherichia coli - Blood cultures have returned positive for Escherichia coli ESBL, urine with Escherichia coli with sensitivity still pending. -Antibiotics have been transitioned over to Primaxin as Rocephin was not adequately covering. -  Physiology of sepsis appears to be improving, leukocytosis trending down, afebrile over last 24 hours, no further hypotension. -Will need to continue Primaxin for 3 weeks to stop on 10/24/15.  UTI - Urine culture growing Escherichia coli, abx transitioned to primaxin, see above.  Acute renal failure on CKD stage III - Resolved with IV fluids  Hypertension -Resolved  CAD - Continue with aspirin  Dementia - Continue with home medication  Procedures:  None   Consultations:  none  Discharge Instructions      Discharge Instructions    Diet - low sodium heart healthy    Complete by:  As directed      Increase activity slowly    Complete by:  As directed             Medication List    STOP taking these medications        divalproex 500 MG 24 hr tablet  Commonly known as:  DEPAKOTE ER  Replaced by:  divalproex 125 MG capsule      TAKE these medications        acetaminophen 325 MG tablet  Commonly known as:  TYLENOL  Take 2 tablets (650 mg total) by mouth every 6 (six) hours as needed for mild pain (or Fever >/= 101).     aspirin 325 MG EC tablet  Take 1 tablet (325 mg total) by mouth daily with breakfast.     cyanocobalamin 500 MCG tablet  Take 500 mcg by mouth daily.     divalproex 125 MG capsule  Commonly known as:  DEPAKOTE SPRINKLE  Take 6 capsules (750 mg total) by  mouth at bedtime.     docusate sodium 100 MG capsule  Commonly known as:  COLACE  Take 1 capsule (100 mg total) by mouth 2 (two) times daily.     eucerin cream  Apply 1 application topically 2 (two) times daily.     feeding supplement (ENSURE ENLIVE) Liqd  Take 237 mLs by mouth 2 (two) times daily between meals.     ferrous sulfate 325 (65 FE) MG tablet  Take 325 mg by mouth daily with breakfast.     fluticasone 27.5 MCG/SPRAY nasal spray  Commonly known as:  VERAMYST  Place 1 spray into the nose 2 (two) times daily.     HYDROcodone-acetaminophen 5-325 MG tablet  Commonly known  as:  NORCO  Take 1 tablet by mouth every 6 (six) hours as needed for moderate pain.     imipenem-cilastatin 500 mg in sodium chloride 0.9 % 100 mL  Inject 500 mg into the vein every 8 (eight) hours.     metoprolol tartrate 25 MG tablet  Commonly known as:  LOPRESSOR  Take 0.5 tablets (12.5 mg total) by mouth 2 (two) times daily.     NITROSTAT 0.4 MG SL tablet  Generic drug:  nitroGLYCERIN  DISSOLVE ONE TABLET UNDER THE TONGUE EVERY 5 MINUTES AS NEEDED FOR CHEST PAIN     pantoprazole 20 MG tablet  Commonly known as:  PROTONIX  Take 1 tablet (20 mg total) by mouth daily.     QUEtiapine 25 MG tablet  Commonly known as:  SEROQUEL  Take 25 mg by mouth at bedtime.     sertraline 100 MG tablet  Commonly known as:  ZOLOFT  Take 100 mg by mouth daily.       Allergies  Allergen Reactions  . Aricept [Donepezil Hcl] Other (See Comments)    Altered mental status  . Ativan [Lorazepam] Other (See Comments)    Altered mental status  . Atorvastatin Nausea And Vomiting and Other (See Comments)    Joint pain  . Namenda [Memantine] Other (See Comments)    Altered mental status  . Other     Wife says pt was given 2 different medications for his nerves but said they made him have SI.  Marland Kitchen Simvastatin Nausea And Vomiting and Other (See Comments)    Joint pain  . Sorbitan Nausea And Vomiting    headache   Follow-up Information    Follow up with Renata Caprice, DO. Schedule an appointment as soon as possible for a visit in 2 weeks.   Specialty:  Family Medicine   Contact information:   [place] [phone] [phone]        The results of significant diagnostics from this hospitalization (including imaging, microbiology, ancillary and laboratory) are listed below for reference.    Significant Diagnostic Studies: Dg Chest Port 1 View  10/06/2015  CLINICAL DATA:  Acute onset of altered mental status. Code sepsis. Fever and lethargy. Initial encounter. EXAM: PORTABLE CHEST  1 VIEW COMPARISON:  Chest radiograph performed 09/17/2015 FINDINGS: The lungs are well-aerated. Mild vascular congestion is noted, with mild left basilar opacity possibly reflecting atelectasis or mild pneumonia. There is no evidence of pleural effusion or pneumothorax. The cardiomediastinal silhouette is borderline normal in size. No acute osseous abnormalities are seen. IMPRESSION: Mild vascular congestion noted. Mild left basilar opacity may reflect atelectasis or mild pneumonia. Electronically Signed   By: Garald Balding M.D.   On: 10/06/2015 18:34   Dg Chest Port 1  View  09/17/2015  CLINICAL DATA:  Status post fall, left hip fracture EXAM: PORTABLE CHEST 1 VIEW COMPARISON:  09/06/2015 FINDINGS: The heart size and mediastinal contours are within normal limits. Both lungs are clear. Possible right eighth lateral rib fracture. Mild osteoarthritis of bilateral glenohumeral joints. IMPRESSION: No active disease. Possible right eighth lateral rib fracture. Electronically Signed   By: Kathreen Devoid   On: 09/17/2015 13:22   Dg Shoulder Left Port  09/20/2015  CLINICAL DATA:  Pain following fall EXAM: LEFT SHOULDER - 2 VIEW COMPARISON:  None. FINDINGS: Frontal and Y scapular images were obtained. There is no demonstrable acute fracture or dislocation. There is mild generalized osteoarthritic change with superior migration of the humeral head on the left. No erosive change. Visualized left lung clear. IMPRESSION: Mild generalized osteoarthritic change. Apparent chronic rotator cuff tear on the left with superior migration of the left humeral head. No acute fracture or dislocation. Electronically Signed   By: Lowella Grip III M.D.   On: 09/20/2015 13:44   Dg C-arm 61-120 Min  09/18/2015  CLINICAL DATA:  Left hip surgery. EXAM: DG C-ARM 61-120 MIN; LEFT FEMUR 2 VIEWS COMPARISON:  Radiographs earlier this day. FINDINGS: Four fluoroscopic spot images from the operating room during left femur ORIF demonstrate  intra medullary rod with distal and proximal trans trochanteric screws traversing left proximal femur fracture. Fluoroscopy time not provided. IMPRESSION: Intraoperative fluoroscopic spot images post intra medullary rod and screw fixation of proximal left femur fracture. Electronically Signed   By: Jeb Levering M.D.   On: 09/18/2015 01:41   Dg Femur Min 2 Views Left  09/18/2015  CLINICAL DATA:  Left hip surgery. EXAM: DG C-ARM 61-120 MIN; LEFT FEMUR 2 VIEWS COMPARISON:  Radiographs earlier this day. FINDINGS: Four fluoroscopic spot images from the operating room during left femur ORIF demonstrate intra medullary rod with distal and proximal trans trochanteric screws traversing left proximal femur fracture. Fluoroscopy time not provided. IMPRESSION: Intraoperative fluoroscopic spot images post intra medullary rod and screw fixation of proximal left femur fracture. Electronically Signed   By: Jeb Levering M.D.   On: 09/18/2015 01:41   Dg Hips Bilat With Pelvis Min 5 Views  09/17/2015  CLINICAL DATA:  FALL, BILATERAL HIP PAIN, Pt brought in by EMS from [facility]. Pt fell from bed.on Friday 09/16/15,Nursing home note reports pt complaining of pain while being turned to left side, BEST IMAGES OBTAINED DUE TO PATIENTS CONDITION, PATIENT HELD FOR SEVERAL IMAGES. HISTORY OF HTN, STROKE, CAD, TIA, ALZHEIMER'S, DEMENTIA, CKD, INTRAMEDULLARY (IM) NAIL INTERTROCHANTERIC OF RIGHT ON 06/25/2015 EXAM: DG HIP (WITH OR WITHOUT PELVIS) 5+V BILAT COMPARISON:  06/25/2015 and previous FINDINGS: Sliding screw and IM rod transfix right intertrochanteric fracture in near anatomic alignment. The distal into the IM rod is not included. There is impacted midcervical fracture of the left femur. No dislocation. Bony pelvis intact. Bilateral pelvic phleboliths. IMPRESSION: 1. Acute impacted midcervical left femur fracture. 2. Postop changes across the right femoral neck without acute abnormality. Electronically Signed   By: Lucrezia Europe M.D.   On: 09/17/2015 12:46    Microbiology: Recent Results (from the past 240 hour(s))  Urine culture     Status: Abnormal   Collection Time: 10/06/15  6:20 PM  Result Value Ref Range Status   Specimen Description URINE, CATHETERIZED  Final   Special Requests NONE  Final   Culture (A)  Final    >=100,000 COLONIES/mL ESCHERICHIA COLI Confirmed Extended Spectrum Beta-Lactamase Producer (ESBL) Performed at  [facility]    Report Status 10/10/2015 FINAL  Final   Organism ID, Bacteria ESCHERICHIA COLI (A)  Final      Susceptibility   Escherichia coli - MIC*    AMPICILLIN >=32 RESISTANT Resistant     CEFAZOLIN >=64 RESISTANT Resistant     CEFTRIAXONE >=64 RESISTANT Resistant     CIPROFLOXACIN >=4 RESISTANT Resistant     GENTAMICIN <=1 SENSITIVE Sensitive     IMIPENEM <=0.25 SENSITIVE Sensitive     NITROFURANTOIN <=16 SENSITIVE Sensitive     TRIMETH/SULFA >=320 RESISTANT Resistant     AMPICILLIN/SULBACTAM >=32 RESISTANT Resistant     PIP/TAZO 16 SENSITIVE Sensitive     * >=100,000 COLONIES/mL ESCHERICHIA COLI  Blood Culture (routine x 2)     Status: Abnormal   Collection Time: 10/06/15  6:47 PM  Result Value Ref Range Status   Specimen Description BLOOD RIGHT ARM  Final   Special Requests BOTTLES DRAWN AEROBIC AND ANAEROBIC 6CC  Final   Culture  Setup Time   Final    GRAM POSITIVE RODS CRITICAL RESULT CALLED TO, READ BACK BY AND VERIFIED WITH: HANEY,L AT 1100 BY HUFFINES,S ON 10/07/15 GRAM NEGATIVE RODS AEROBIC BOTTLE ONLY CORRECTED REPORT PREVIOUSLY REPORTED AS GRAM POSITIVE RODS CRITICAL RESULT CALLED TO, READ BACK BY AND VERIFIED WITH: Jonetta Osgood RN [id number] 10/07/15 A BROWNING    Culture (A)  Final    ESCHERICHIA COLI SUSCEPTIBILITIES PERFORMED ON PREVIOUS CULTURE WITHIN THE LAST 5 DAYS. Performed at [facility]    Report Status 10/09/2015 FINAL  Final  Blood Culture (routine x 2)     Status: Abnormal   Collection Time: 10/06/15  6:54 PM  Result Value Ref  Range Status   Specimen Description BLOOD LEFT HAND  Final   Special Requests BOTTLES DRAWN AEROBIC AND ANAEROBIC 6CC  Final   Culture  Setup Time   Final    GRAM POSITIVE RODS CRITICAL RESULT CALLED TO, READ BACK BY AND VERIFIED WITH: JARRELL,W AT 1150 BY HUFFINES,S ON 10/07/15 GRAM NEGATIVE RODS IN BOTH AEROBIC AND ANAEROBIC BOTTLES CRITICAL RESULT CALLED TO, READ BACK BY AND VERIFIED WITH: Jonetta Osgood RN [id number] 10/07/15 A BROWNING Organism ID to follow CRITICAL RESULT CALLED TO, READ BACK BY AND VERIFIED WITH: Carleene Mains RN [id number] 10/07/15 A BROWNING    Culture (A)  Final    ESCHERICHIA COLI Confirmed Extended Spectrum Beta-Lactamase Producer (ESBL) Performed at [facility]    Report Status 10/09/2015 FINAL  Final   Organism ID, Bacteria ESCHERICHIA COLI  Final      Susceptibility   Escherichia coli - MIC*    AMPICILLIN >=32 RESISTANT Resistant     CEFAZOLIN >=64 RESISTANT Resistant     CEFEPIME >=64 RESISTANT Resistant     CEFTAZIDIME 16 RESISTANT Resistant     CEFTRIAXONE >=64 RESISTANT Resistant     CIPROFLOXACIN >=4 RESISTANT Resistant     GENTAMICIN <=1 SENSITIVE Sensitive     IMIPENEM <=0.25 SENSITIVE Sensitive     TRIMETH/SULFA >=320 RESISTANT Resistant     AMPICILLIN/SULBACTAM >=32 RESISTANT Resistant     PIP/TAZO 16 SENSITIVE Sensitive     * ESCHERICHIA COLI  Blood Culture ID Panel (Reflexed)     Status: Abnormal   Collection Time: 10/06/15  6:54 PM  Result Value Ref Range Status   Enterococcus species NOT DETECTED NOT DETECTED Final   Vancomycin resistance NOT DETECTED NOT DETECTED Final   Listeria monocytogenes NOT DETECTED NOT DETECTED Final   Staphylococcus species NOT  DETECTED NOT DETECTED Final   Staphylococcus aureus NOT DETECTED NOT DETECTED Final   Methicillin resistance NOT DETECTED NOT DETECTED Final   Streptococcus species NOT DETECTED NOT DETECTED Final   Streptococcus agalactiae NOT DETECTED NOT DETECTED Final   Streptococcus pneumoniae NOT DETECTED  NOT DETECTED Final   Streptococcus pyogenes NOT DETECTED NOT DETECTED Final   Acinetobacter baumannii NOT DETECTED NOT DETECTED Final   Enterobacteriaceae species DETECTED (A) NOT DETECTED Final    Comment: CRITICAL RESULT CALLED TO, READ BACK BY AND VERIFIED WITH: Jerilynn Mages MCDANIEL RN [id number] 10/07/15 A BROWNING    Enterobacter cloacae complex NOT DETECTED NOT DETECTED Final   Escherichia coli DETECTED (A) NOT DETECTED Final    Comment: CRITICAL RESULT CALLED TO, READ BACK BY AND VERIFIED WITH: Jerilynn Mages MCDANIEL RN [id number] 10/07/15 A BROWNING    Klebsiella oxytoca NOT DETECTED NOT DETECTED Final   Klebsiella pneumoniae NOT DETECTED NOT DETECTED Final   Proteus species NOT DETECTED NOT DETECTED Final   Serratia marcescens NOT DETECTED NOT DETECTED Final   Carbapenem resistance NOT DETECTED NOT DETECTED Final   Haemophilus influenzae NOT DETECTED NOT DETECTED Final   Neisseria meningitidis NOT DETECTED NOT DETECTED Final   Pseudomonas aeruginosa NOT DETECTED NOT DETECTED Final   Candida albicans NOT DETECTED NOT DETECTED Final   Candida glabrata NOT DETECTED NOT DETECTED Final   Candida krusei NOT DETECTED NOT DETECTED Final   Candida parapsilosis NOT DETECTED NOT DETECTED Final   Candida tropicalis NOT DETECTED NOT DETECTED Final    Comment: Performed at [facility]  MRSA PCR Screening     Status: None   Collection Time: 10/07/15 12:42 AM  Result Value Ref Range Status   MRSA by PCR NEGATIVE NEGATIVE Final    Comment:        The GeneXpert MRSA Assay (FDA approved for NASAL specimens only), is one component of a comprehensive MRSA colonization surveillance program. It is not intended to diagnose MRSA infection nor to guide or monitor treatment for MRSA infections.   Culture, blood (Routine X 2) w Reflex to ID Panel     Status: None (Preliminary result)   Collection Time: 10/08/15  4:29 AM  Result Value Ref Range Status   Specimen Description BLOOD LEFT ARM  Final   Special Requests  BOTTLES DRAWN AEROBIC ONLY 4CC  Final   Culture NO GROWTH 2 DAYS  Final   Report Status PENDING  Incomplete  Culture, blood (Routine X 2) w Reflex to ID Panel     Status: None (Preliminary result)   Collection Time: 10/08/15  8:04 AM  Result Value Ref Range Status   Specimen Description BLOOD RIGHT ARM  Final   Special Requests BOTTLES DRAWN AEROBIC AND ANAEROBIC 6CC  Final   Culture NO GROWTH 2 DAYS  Final   Report Status PENDING  Incomplete     Labs: Basic Metabolic Panel:  Recent Labs Lab 10/06/15 1854 10/07/15 0507 10/08/15 0429 10/09/15 0548 10/10/15 0609  NA 134* 138 140 142 142  K 3.7 3.8 4.1 3.4* 3.3*  CL 106 109 113* 110 114*  CO2 24 21* 20* 26 25  GLUCOSE 105* 115* 91 100* 90  BUN 45* 43* 39* 30* 25*  CREATININE 1.74* 1.60* 1.36* 1.32* 1.11  CALCIUM 7.3* 7.7* 8.1* 8.3* 8.0*   Liver Function Tests:  Recent Labs Lab 10/06/15 1854  AST 10*  ALT 7*  ALKPHOS 128*  BILITOT 0.5  PROT 5.2*  ALBUMIN 2.2*   No results for input(s): LIPASE,  AMYLASE in the last 168 hours. No results for input(s): AMMONIA in the last 168 hours. CBC:  Recent Labs Lab 10/06/15 1808 10/07/15 0507 10/08/15 0429 10/09/15 0548 10/10/15 0609  WBC 21.3* 16.7* 11.9* 14.9* 12.9*  NEUTROABS 19.6*  --   --   --   --   HGB 10.1* 9.0* 9.9* 10.5* 9.9*  HCT 30.4* 27.4* 31.3* 32.7* 31.3*  MCV 87.9 88.4 89.9 89.3 89.4  PLT 274 193 142* 278 267   Cardiac Enzymes:  Recent Labs Lab 10/06/15 1854  TROPONINI 0.03   BNP: BNP (last 3 results) No results for input(s): BNP in the last 8760 hours.  ProBNP (last 3 results) No results for input(s): PROBNP in the last 8760 hours.  CBG: No results for input(s): GLUCAP in the last 168 hours.     SignedLelon Frohlich  [facility] Pager: [phone] 10/10/2015, 3:26 PM

## 2015-10-10 NOTE — Clinical Social Work Note (Signed)
Pt d/c today back to [facility]. Pt's daughter, Otila Kluver and facility aware and agreeable. PICC line for several weeks antibiotics. RN or Network engineer to arrange transport via [place] after PICC placed.   Benay Pike, [place]

## 2015-10-10 NOTE — Care Management Important Message (Signed)
Important Message  Patient Details  Name: CHEVIS LOUNDS MRN: [id number] Date of Birth: 07-Aug-1928   Medicare Important Message Given:  Yes    Cyera Balboni, [place], RN 10/10/2015, 11:21 AM

## 2015-10-10 NOTE — Care Management Note (Signed)
Case Management Note  Patient Details  Name: JAYTEN KISSAM MRN: [id number] Date of Birth: 1928-05-29  Expected Discharge Date:  10/11/15               Expected Discharge Plan:  Skilled Nursing Facility  In-House Referral:  Clinical Social Work  Discharge planning Services  CM Consult  Post Acute Care Choice:  NA Choice offered to:  NA  DME Arranged:    DME Agency:     HH Arranged:    [place] Agency:     Status of Service:  Completed, signed off  Medicare Important Message Given:  Yes Date Medicare IM Given:    Medicare IM give by:    Date Additional Medicare IM Given:    Additional Medicare Important Message give by:     If discussed at [place] of Stay Meetings, dates discussed:    Additional Comments: Patient discharging back to SNF. Has a new PICC line, placed today for IV antibiotics to be given at [facility]. CSW making arrangements for return.   Jamina Macbeth, [place], RN 10/10/2015, 2:34 PM

## 2015-10-10 NOTE — Progress Notes (Signed)
Called report to [facility] nurse.

## 2015-10-13 LAB — CULTURE, BLOOD (ROUTINE X 2)
CULTURE: NO GROWTH
Culture: NO GROWTH

## 2015-10-24 DIAGNOSIS — G4700 Insomnia, unspecified: Secondary | ICD-10-CM | POA: Diagnosis not present

## 2015-10-24 DIAGNOSIS — N179 Acute kidney failure, unspecified: Secondary | ICD-10-CM | POA: Diagnosis not present

## 2015-10-24 DIAGNOSIS — N390 Urinary tract infection, site not specified: Secondary | ICD-10-CM | POA: Diagnosis not present

## 2015-10-24 DIAGNOSIS — T50905D Adverse effect of unspecified drugs, medicaments and biological substances, subsequent encounter: Secondary | ICD-10-CM | POA: Diagnosis not present

## 2015-10-27 DIAGNOSIS — G4700 Insomnia, unspecified: Secondary | ICD-10-CM | POA: Diagnosis not present

## 2015-10-27 DIAGNOSIS — N390 Urinary tract infection, site not specified: Secondary | ICD-10-CM | POA: Diagnosis not present

## 2015-11-01 DIAGNOSIS — S72141D Displaced intertrochanteric fracture of right femur, subsequent encounter for closed fracture with routine healing: Secondary | ICD-10-CM | POA: Diagnosis not present

## 2015-11-01 DIAGNOSIS — S72042D Displaced fracture of base of neck of left femur, subsequent encounter for closed fracture with routine healing: Secondary | ICD-10-CM | POA: Diagnosis not present

## 2015-11-29 ENCOUNTER — Emergency Department (HOSPITAL_COMMUNITY): Payer: Medicare Other

## 2015-11-29 ENCOUNTER — Emergency Department (HOSPITAL_COMMUNITY)
Admission: EM | Admit: 2015-11-29 | Discharge: 2015-11-30 | Disposition: A | Discharge status: Home / Self Care | Payer: Medicare Other | Attending: Emergency Medicine | Admitting: Emergency Medicine

## 2015-11-29 ENCOUNTER — Encounter (HOSPITAL_COMMUNITY): Payer: Self-pay | Admitting: Emergency Medicine

## 2015-11-29 DIAGNOSIS — S0990XA Unspecified injury of head, initial encounter: Secondary | ICD-10-CM

## 2015-11-29 DIAGNOSIS — M25512 Pain in left shoulder: Secondary | ICD-10-CM | POA: Diagnosis not present

## 2015-11-29 DIAGNOSIS — Z7982 Long term (current) use of aspirin: Secondary | ICD-10-CM | POA: Diagnosis not present

## 2015-11-29 DIAGNOSIS — S59902A Unspecified injury of left elbow, initial encounter: Secondary | ICD-10-CM | POA: Diagnosis present

## 2015-11-29 DIAGNOSIS — N183 Chronic kidney disease, stage 3 (moderate): Secondary | ICD-10-CM | POA: Insufficient documentation

## 2015-11-29 DIAGNOSIS — Z87891 Personal history of nicotine dependence: Secondary | ICD-10-CM | POA: Diagnosis not present

## 2015-11-29 DIAGNOSIS — I129 Hypertensive chronic kidney disease with stage 1 through stage 4 chronic kidney disease, or unspecified chronic kidney disease: Secondary | ICD-10-CM | POA: Diagnosis not present

## 2015-11-29 DIAGNOSIS — Y939 Activity, unspecified: Secondary | ICD-10-CM | POA: Diagnosis not present

## 2015-11-29 DIAGNOSIS — S51012A Laceration without foreign body of left elbow, initial encounter: Secondary | ICD-10-CM | POA: Diagnosis not present

## 2015-11-29 DIAGNOSIS — S72042D Displaced fracture of base of neck of left femur, subsequent encounter for closed fracture with routine healing: Secondary | ICD-10-CM | POA: Diagnosis not present

## 2015-11-29 DIAGNOSIS — Z743 Need for continuous supervision: Secondary | ICD-10-CM | POA: Diagnosis not present

## 2015-11-29 DIAGNOSIS — R279 Unspecified lack of coordination: Secondary | ICD-10-CM | POA: Diagnosis not present

## 2015-11-29 DIAGNOSIS — Y92129 Unspecified place in nursing home as the place of occurrence of the external cause: Secondary | ICD-10-CM | POA: Diagnosis not present

## 2015-11-29 DIAGNOSIS — S299XXA Unspecified injury of thorax, initial encounter: Secondary | ICD-10-CM | POA: Diagnosis not present

## 2015-11-29 DIAGNOSIS — W050XXA Fall from non-moving wheelchair, initial encounter: Secondary | ICD-10-CM | POA: Diagnosis not present

## 2015-11-29 DIAGNOSIS — Y999 Unspecified external cause status: Secondary | ICD-10-CM | POA: Insufficient documentation

## 2015-11-29 DIAGNOSIS — Z79899 Other long term (current) drug therapy: Secondary | ICD-10-CM | POA: Insufficient documentation

## 2015-11-29 DIAGNOSIS — I2510 Atherosclerotic heart disease of native coronary artery without angina pectoris: Secondary | ICD-10-CM | POA: Insufficient documentation

## 2015-11-29 DIAGNOSIS — W19XXXA Unspecified fall, initial encounter: Secondary | ICD-10-CM

## 2015-11-29 DIAGNOSIS — G309 Alzheimer's disease, unspecified: Secondary | ICD-10-CM | POA: Insufficient documentation

## 2015-11-29 DIAGNOSIS — S4992XA Unspecified injury of left shoulder and upper arm, initial encounter: Secondary | ICD-10-CM | POA: Diagnosis not present

## 2015-11-29 DIAGNOSIS — R404 Transient alteration of awareness: Secondary | ICD-10-CM | POA: Diagnosis not present

## 2015-11-29 DIAGNOSIS — S098XXA Other specified injuries of head, initial encounter: Secondary | ICD-10-CM | POA: Diagnosis not present

## 2015-11-29 NOTE — Discharge Instructions (Signed)
You have been seen in the Emergency Department (ED) today for a fall.  Your work up does not show any concerning injuries.  Please take over-the-counter Tylenol as needed for your pain (unless you have an allergy or your doctor as told you not to take them), or take any prescribed medication as instructed. ° °Please follow up with your doctor regarding today's Emergency Department (ED) visit and your recent fall.   ° °Return to the ED if you have any headache, confusion, slurred speech, weakness/numbness of any arm or leg, or any increased pain. ° °

## 2015-11-29 NOTE — ED Provider Notes (Signed)
Emergency Department Provider Note   I have reviewed the triage vital signs and the nursing notes.   HISTORY  Chief Complaint Fall  HPI David Collier is a 82 y.o. male complaining of left shoulder pain and left elbow wound s/p fall earlier today. Pt denies head injury. Pt reports right shoulder stiffness that is baseline. Patient is unable to describe the fall or circumstances surrounding this due to severe dementia. Daughter states pt broke both hips while in nursing home and was seen today for Orthopedic follow-up. Pt's daughter was called earlier today and informed that the pt had fallen, but was not told the details of the fall. Pt denies head injury. Pt denies nausea, vomiting, fever.  HPI limited by severe dementia.   Past Medical History:  Diagnosis Date  . Alzheimer's dementia   . Anginal pain (HCC)    NONE IN 1 MONTH   . 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 CAD (coronary artery disease), native coronary artery    Initially diagnosed 2003 2006 catheterization with bare metal stenting of the first marginal branch of the circumflex and continuation branch of the circumflex 2010 catheterization showing normal left main, 50-60% LAD unchanged from before, proximal stent of marginal patent, severe in-stent restenosis treated with cutting balloon angioplasty, patent stent in continuation branch with mild in-stent restenosis, occluded nondominant right coronary artery.   . CKD (chronic kidney disease), stage III   . Coronary atherosclerosis of native coronary artery    BMS circumflex and OM 2006, PTCA ISR 2010, moderate LAD disease  . Depression   . DJD (degenerative joint disease)   . Headache   . History of stroke    Seen radiographically-left PICA; unknown to patient.  . Hyperlipidemia   . Hypertension   . Macular degeneration   . Myocardial infarction ([place])    x5    1980s  sees DR. MICHAEL COOPER  . Seizures ([place])   . Stroke ([place])   . TIA (transient  ischemic attack) 03/05/2014    Patient Active Problem List   Diagnosis Date Noted  . Bacteremia 10/08/2015  . Sepsis ([place]) 10/06/2015  . UTI (lower urinary tract infection) 10/06/2015  . Acute renal failure superimposed on stage 3 chronic kidney disease ([place]) 10/06/2015  . Acute blood loss anemia 09/18/2015  . Closed left hip fracture ([place]) 09/17/2015  . Trochanteric fracture of right femur ([place]) 06/24/2015  . Community acquired pneumonia 06/24/2015  . Hip fracture ([place]) 06/24/2015  . Obesity (BMI 30-39.9) 07/11/2014  . Dementia 07/11/2014  . Unstable angina pectoris ([place]) 07/11/2014  . 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 CKD (chronic kidney disease), stage III 03/05/2014  . Macular degeneration (senile) of retina 02/16/2009  . Essential hypertension 02/16/2009  . Hyperlipidemia   . CAD (coronary artery disease), native coronary artery     Past Surgical History:  Procedure Laterality Date  . APPENDECTOMY    . CARDIAC CATHETERIZATION     3 STENTS  AT  [place]  80S OR 90S  . CHOLECYSTECTOMY    . CORONARY ANGIOPLASTY    . FEMUR IM NAIL Left 09/17/2015   Procedure: INTRAMEDULLARY (IM) NAIL FEMORAL;  Surgeon: Dorna Leitz, MD;  Location: [facility];  Service: Orthopedics;  Laterality: Left;  . INTRAMEDULLARY (IM) NAIL INTERTROCHANTERIC Right 06/25/2015   Procedure: INTRAMEDULLARY (IM) NAIL INTERTROCHANTRIC;  Surgeon: Dorna Leitz, MD;  Location: [facility];  Service: Orthopedics;  Laterality: Right;  . MULTIPLE EXTRACTIONS WITH ALVEOLOPLASTY N/A 10/22/2014   Procedure: MULTIPLE EXTRACTIONS  # 4,6, 7, 8, 10, 21, 22, 23, 24, 25, 26, 27, 28 and ALVEOLOPLASTY;  Surgeon: Diona Browner, DDS;  Location: [facility];  Service: Oral Surgery;  Laterality: N/A;    Current Outpatient Rx  . Order #: [id number] Class: Normal  . Order #: [id number] Class: Historical Med  . Order #:  [id number] Class: Normal  . Order #: [id number] Class: Historical Med  . Order #: [id number] Class: No Print  . Order #: [id number] Class: No Print  . Order #: [id number] Class: Historical Med  . Order #: [id number] Class: Historical Med  . Order #: [id number] Class: Print  . Order #: [id number] Class: Historical Med  . Order #: [id number] Class: No Print  . Order #: [id number] Class: Normal  . Order #: [id number] Class: Print  . Order #: [id number] Class: Historical Med  . Order #: [id number] Class: Historical Med    Allergies Aricept [donepezil hcl]; Ativan [lorazepam]; Atorvastatin; Namenda [memantine]; Other; Simvastatin; and Sorbitan  Family History  Problem Relation Age of Onset  . Heart failure Father   . Heart attack Father   . Heart failure Mother   . CAD Brother   . CAD Brother   . Cancer Brother   . Cancer Brother   . Hypertension Sister   . Heart attack Sister   . Heart failure      Social History Social History  Substance Use Topics  . Smoking status: Former Smoker    Types: Cigarettes    Quit date: 04/23/1966  . Smokeless tobacco: Never Used  . Alcohol use No    Review of Systems Constitutional: No fever/chills Cardiovascular: Denies chest pain. Respiratory: Denies shortness of breath. Gastrointestinal: No abdominal pain.  No nausea, no vomiting. No constipation. Genitourinary: Negative for dysuria. Musculoskeletal: Negative for back pain. Positive Left shoulder pain. Positive Right shoulder stiffness. Skin: Positive wound on right elbow. Neurological: Negative for head injury.  10-point ROS otherwise negative. ROS limited by severe dementia.   ____________________________________________   PHYSICAL EXAM:  VITAL SIGNS: ED Triage Vitals  Enc Vitals Group     BP 11/29/15 2101 136/63     Pulse Rate 11/29/15 2101 69     Resp 11/29/15 2101 18     Temp 11/29/15 2101 98.2 F (36.8 C)     Temp Source 11/29/15 2101 Oral     SpO2 11/29/15 2101 98 %     Weight 11/29/15  2101 181 lb (82.1 kg)     Height 11/29/15 2101 5\' 10"  (1.778 m)     Pain Score 11/29/15 2059 5    Constitutional: Alert and oriented. Well appearing, cooperative, but confused.  Eyes: Conjunctivae are normal. PERRL. EOMI. Head: Atraumatic. Nose: No congestion/rhinnorhea. Mouth/Throat: Mucous membranes are moist.  Oropharynx non-erythematous. Neck: No stridor. No cervical spine tenderness to palpation. Cardiovascular: Normal rate, regular rhythm. Good peripheral circulation. Grossly normal heart sounds.   Respiratory: Normal respiratory effort.  No retractions. Lungs CTAB. Gastrointestinal: Soft and nontender. No distention.  Musculoskeletal: No lower extremity tenderness nor edema. Full ROM of both hips and knees. No gross deformities of extremities. Mild tenderness to palpation of the left anterior shoulder. Patient with full ROM of both upper extremities. No left elbow tenderness with full ROM. No chest wall deformity, bruising, or tenderness to palpation.  Neurologic:  Normal speech and language. No gross focal neurologic deficits are appreciated.  Skin:  Skin is warm and dry. Small 2x2 cm skin tare over the lateral left elbow. No rash  noted. Psychiatric: Mood and affect are normal. Speech and behavior are normal.  ____________________________________________   LABS (all labs ordered are listed, but only abnormal results are displayed)  None  ____________________________________________  EKG  Reviewed in [facility]. No STEMI. RBBB.  ____________________________________________  RADIOLOGY  Dg Chest 2 View  Result Date: 11/29/2015 CLINICAL DATA:  Status post fall, with left shoulder pain. Initial encounter. EXAM: CHEST  2 VIEW COMPARISON:  Chest radiograph performed 10/10/2015 FINDINGS: The lungs are well-aerated and clear. There is no evidence of focal opacification, pleural effusion or pneumothorax. The heart is borderline normal in size. No acute osseous abnormalities are seen.  Clips are noted within the right upper quadrant, reflecting prior cholecystectomy. IMPRESSION: 1. No acute cardiopulmonary process seen. 2. No displaced rib fractures identified. Electronically Signed   By: Garald Balding M.D.   On: 11/29/2015 22:30   Ct Head Wo Contrast  Result Date: 11/29/2015 CLINICAL DATA:  Status post fall, with concern for head injury. Initial encounter. EXAM: CT HEAD WITHOUT CONTRAST TECHNIQUE: Contiguous axial images were obtained from the base of the skull through the vertex without intravenous contrast. COMPARISON:  CT of the head performed 03/04/2015 FINDINGS: There is no evidence of acute infarction, mass lesion, or intra- or extra-axial hemorrhage on CT. Prominence of the ventricles and sulci reflects mild to moderate cortical volume loss. Mild cerebellar atrophy is noted. Scattered periventricular and subcortical white matter change likely reflects small vessel ischemic microangiopathy. The brainstem and fourth ventricle are within normal limits. The basal ganglia are unremarkable in appearance. The cerebral hemispheres demonstrate grossly normal gray-white differentiation. No mass effect or midline shift is seen. There is no evidence of fracture; visualized osseous structures are unremarkable in appearance. The visualized portions of the orbits are within normal limits. The paranasal sinuses and mastoid air cells are well-aerated. No significant soft tissue abnormalities are seen. IMPRESSION: 1. No evidence of traumatic intracranial injury or fracture. 2. Mild to moderate cortical volume loss and scattered small vessel ischemic microangiopathy. Electronically Signed   By: Garald Balding M.D.   On: 11/29/2015 22:25   Dg Shoulder Left  Result Date: 11/29/2015 CLINICAL DATA:  Status post fall, with left shoulder pain. Initial encounter. EXAM: LEFT SHOULDER - 2+ VIEW COMPARISON:  None. FINDINGS: There is no evidence of fracture or dislocation. The left humeral head is seated within  the glenoid fossa. Mild superior subluxation of the left humeral head likely reflects chronic rotator cuff tear. The acromioclavicular joint is unremarkable in appearance. No significant soft tissue abnormalities are seen. The visualized portions of the left lung are clear. IMPRESSION: No evidence of acute fracture or dislocation. Electronically Signed   By: Garald Balding M.D.   On: 11/29/2015 22:31    ____________________________________________   PROCEDURES  Procedure(s) performed:   Procedures  None ____________________________________________   INITIAL IMPRESSION / ASSESSMENT AND PLAN / ED COURSE  Pertinent labs & imaging results that were available during my care of the patient were reviewed by me and considered in my medical decision making (see chart for details).  Patient with mechanical fall at nursing home earlier this evening. Complaining of left shoulder pain and small skin tear to the left elbow. The elbow injury is not amenable to repair with sutures. Plan for imaging of the head, chest, left shoulder. Patient has no tenderness over the elbow. Full range of motion with no obvious deformity.  10:41 PM X-ray and CT scan resulted with no acute injury. Reassessed patient who is awake and alert.  We'll discharge home with plans to continue basic wound care to the elbow and follow with his primary care physician as needed.  At this time, I do not feel there is any life-threatening condition present. I have reviewed and discussed all results (EKG, imaging, lab, urine as appropriate), exam findings with patient. I have reviewed nursing notes and appropriate previous records.  I feel the patient is safe to be discharged home without further emergent workup. Discussed usual and customary return precautions. Patient and family (if present) verbalize understanding and are comfortable with this plan.  Patient will follow-up with their primary care provider. If they do not have a primary  care provider, information for follow-up has been provided to them. All questions have been answered.   ____________________________________________  FINAL CLINICAL IMPRESSION(S) / ED DIAGNOSES  Final diagnoses:  Fall, initial encounter  Minor head injury, initial encounter  Left shoulder pain     MEDICATIONS GIVEN DURING THIS VISIT:  None  NEW OUTPATIENT MEDICATIONS STARTED DURING THIS VISIT:  None   Note:  This document was prepared using Dragon voice recognition software and may include unintentional dictation errors.  Nanda Quinton, MD Emergency Medicine    Margette Fast, MD 11/30/15 1049

## 2015-11-29 NOTE — ED Notes (Signed)
Report called to Steva Colder, RN.

## 2015-11-29 NOTE — ED Triage Notes (Signed)
Pt slipped out of wheelchair at nursing home, [facility]. Pt c/o left shoulder pain and has skin tear to the left arm.

## 2015-12-02 DIAGNOSIS — I2510 Atherosclerotic heart disease of native coronary artery without angina pectoris: Secondary | ICD-10-CM | POA: Diagnosis not present

## 2015-12-02 DIAGNOSIS — I10 Essential (primary) hypertension: Secondary | ICD-10-CM | POA: Diagnosis not present

## 2015-12-02 DIAGNOSIS — F0390 Unspecified dementia without behavioral disturbance: Secondary | ICD-10-CM | POA: Diagnosis not present

## 2015-12-02 DIAGNOSIS — R32 Unspecified urinary incontinence: Secondary | ICD-10-CM | POA: Diagnosis not present

## 2015-12-07 DIAGNOSIS — R1312 Dysphagia, oropharyngeal phase: Secondary | ICD-10-CM | POA: Diagnosis not present

## 2015-12-07 DIAGNOSIS — M6281 Muscle weakness (generalized): Secondary | ICD-10-CM | POA: Diagnosis not present

## 2015-12-07 DIAGNOSIS — R293 Abnormal posture: Secondary | ICD-10-CM | POA: Diagnosis not present

## 2015-12-07 DIAGNOSIS — R1310 Dysphagia, unspecified: Secondary | ICD-10-CM | POA: Diagnosis not present

## 2015-12-07 DIAGNOSIS — A419 Sepsis, unspecified organism: Secondary | ICD-10-CM | POA: Diagnosis not present

## 2015-12-07 DIAGNOSIS — R262 Difficulty in walking, not elsewhere classified: Secondary | ICD-10-CM | POA: Diagnosis not present

## 2015-12-07 DIAGNOSIS — S72002D Fracture of unspecified part of neck of left femur, subsequent encounter for closed fracture with routine healing: Secondary | ICD-10-CM | POA: Diagnosis not present

## 2015-12-07 DIAGNOSIS — R278 Other lack of coordination: Secondary | ICD-10-CM | POA: Diagnosis not present

## 2015-12-08 DIAGNOSIS — R278 Other lack of coordination: Secondary | ICD-10-CM | POA: Diagnosis not present

## 2015-12-08 DIAGNOSIS — M6281 Muscle weakness (generalized): Secondary | ICD-10-CM | POA: Diagnosis not present

## 2015-12-08 DIAGNOSIS — A419 Sepsis, unspecified organism: Secondary | ICD-10-CM | POA: Diagnosis not present

## 2015-12-08 DIAGNOSIS — R1310 Dysphagia, unspecified: Secondary | ICD-10-CM | POA: Diagnosis not present

## 2015-12-08 DIAGNOSIS — R1312 Dysphagia, oropharyngeal phase: Secondary | ICD-10-CM | POA: Diagnosis not present

## 2015-12-08 DIAGNOSIS — R262 Difficulty in walking, not elsewhere classified: Secondary | ICD-10-CM | POA: Diagnosis not present

## 2015-12-08 DIAGNOSIS — S72002D Fracture of unspecified part of neck of left femur, subsequent encounter for closed fracture with routine healing: Secondary | ICD-10-CM | POA: Diagnosis not present

## 2015-12-08 DIAGNOSIS — R293 Abnormal posture: Secondary | ICD-10-CM | POA: Diagnosis not present

## 2015-12-09 DIAGNOSIS — R262 Difficulty in walking, not elsewhere classified: Secondary | ICD-10-CM | POA: Diagnosis not present

## 2015-12-09 DIAGNOSIS — A419 Sepsis, unspecified organism: Secondary | ICD-10-CM | POA: Diagnosis not present

## 2015-12-09 DIAGNOSIS — R1310 Dysphagia, unspecified: Secondary | ICD-10-CM | POA: Diagnosis not present

## 2015-12-09 DIAGNOSIS — S72002D Fracture of unspecified part of neck of left femur, subsequent encounter for closed fracture with routine healing: Secondary | ICD-10-CM | POA: Diagnosis not present

## 2015-12-09 DIAGNOSIS — R278 Other lack of coordination: Secondary | ICD-10-CM | POA: Diagnosis not present

## 2015-12-09 DIAGNOSIS — R1312 Dysphagia, oropharyngeal phase: Secondary | ICD-10-CM | POA: Diagnosis not present

## 2015-12-09 DIAGNOSIS — R293 Abnormal posture: Secondary | ICD-10-CM | POA: Diagnosis not present

## 2015-12-09 DIAGNOSIS — M6281 Muscle weakness (generalized): Secondary | ICD-10-CM | POA: Diagnosis not present

## 2015-12-12 DIAGNOSIS — R278 Other lack of coordination: Secondary | ICD-10-CM | POA: Diagnosis not present

## 2015-12-12 DIAGNOSIS — A419 Sepsis, unspecified organism: Secondary | ICD-10-CM | POA: Diagnosis not present

## 2015-12-12 DIAGNOSIS — S72002D Fracture of unspecified part of neck of left femur, subsequent encounter for closed fracture with routine healing: Secondary | ICD-10-CM | POA: Diagnosis not present

## 2015-12-12 DIAGNOSIS — R1312 Dysphagia, oropharyngeal phase: Secondary | ICD-10-CM | POA: Diagnosis not present

## 2015-12-12 DIAGNOSIS — R1310 Dysphagia, unspecified: Secondary | ICD-10-CM | POA: Diagnosis not present

## 2015-12-12 DIAGNOSIS — R262 Difficulty in walking, not elsewhere classified: Secondary | ICD-10-CM | POA: Diagnosis not present

## 2015-12-12 DIAGNOSIS — R293 Abnormal posture: Secondary | ICD-10-CM | POA: Diagnosis not present

## 2015-12-12 DIAGNOSIS — M6281 Muscle weakness (generalized): Secondary | ICD-10-CM | POA: Diagnosis not present

## 2015-12-13 DIAGNOSIS — R278 Other lack of coordination: Secondary | ICD-10-CM | POA: Diagnosis not present

## 2015-12-13 DIAGNOSIS — R1310 Dysphagia, unspecified: Secondary | ICD-10-CM | POA: Diagnosis not present

## 2015-12-13 DIAGNOSIS — R262 Difficulty in walking, not elsewhere classified: Secondary | ICD-10-CM | POA: Diagnosis not present

## 2015-12-13 DIAGNOSIS — R1312 Dysphagia, oropharyngeal phase: Secondary | ICD-10-CM | POA: Diagnosis not present

## 2015-12-13 DIAGNOSIS — R293 Abnormal posture: Secondary | ICD-10-CM | POA: Diagnosis not present

## 2015-12-13 DIAGNOSIS — S72002D Fracture of unspecified part of neck of left femur, subsequent encounter for closed fracture with routine healing: Secondary | ICD-10-CM | POA: Diagnosis not present

## 2015-12-13 DIAGNOSIS — A419 Sepsis, unspecified organism: Secondary | ICD-10-CM | POA: Diagnosis not present

## 2015-12-13 DIAGNOSIS — M6281 Muscle weakness (generalized): Secondary | ICD-10-CM | POA: Diagnosis not present

## 2015-12-14 DIAGNOSIS — A419 Sepsis, unspecified organism: Secondary | ICD-10-CM | POA: Diagnosis not present

## 2015-12-14 DIAGNOSIS — R278 Other lack of coordination: Secondary | ICD-10-CM | POA: Diagnosis not present

## 2015-12-14 DIAGNOSIS — R1310 Dysphagia, unspecified: Secondary | ICD-10-CM | POA: Diagnosis not present

## 2015-12-14 DIAGNOSIS — R262 Difficulty in walking, not elsewhere classified: Secondary | ICD-10-CM | POA: Diagnosis not present

## 2015-12-14 DIAGNOSIS — R293 Abnormal posture: Secondary | ICD-10-CM | POA: Diagnosis not present

## 2015-12-14 DIAGNOSIS — S72002D Fracture of unspecified part of neck of left femur, subsequent encounter for closed fracture with routine healing: Secondary | ICD-10-CM | POA: Diagnosis not present

## 2015-12-14 DIAGNOSIS — M6281 Muscle weakness (generalized): Secondary | ICD-10-CM | POA: Diagnosis not present

## 2015-12-14 DIAGNOSIS — R1312 Dysphagia, oropharyngeal phase: Secondary | ICD-10-CM | POA: Diagnosis not present

## 2015-12-16 DIAGNOSIS — R293 Abnormal posture: Secondary | ICD-10-CM | POA: Diagnosis not present

## 2015-12-16 DIAGNOSIS — R1312 Dysphagia, oropharyngeal phase: Secondary | ICD-10-CM | POA: Diagnosis not present

## 2015-12-16 DIAGNOSIS — R1310 Dysphagia, unspecified: Secondary | ICD-10-CM | POA: Diagnosis not present

## 2015-12-16 DIAGNOSIS — A419 Sepsis, unspecified organism: Secondary | ICD-10-CM | POA: Diagnosis not present

## 2015-12-16 DIAGNOSIS — M6281 Muscle weakness (generalized): Secondary | ICD-10-CM | POA: Diagnosis not present

## 2015-12-16 DIAGNOSIS — S72002D Fracture of unspecified part of neck of left femur, subsequent encounter for closed fracture with routine healing: Secondary | ICD-10-CM | POA: Diagnosis not present

## 2015-12-16 DIAGNOSIS — R278 Other lack of coordination: Secondary | ICD-10-CM | POA: Diagnosis not present

## 2015-12-16 DIAGNOSIS — R262 Difficulty in walking, not elsewhere classified: Secondary | ICD-10-CM | POA: Diagnosis not present

## 2015-12-20 DIAGNOSIS — R1310 Dysphagia, unspecified: Secondary | ICD-10-CM | POA: Diagnosis not present

## 2015-12-20 DIAGNOSIS — S72002D Fracture of unspecified part of neck of left femur, subsequent encounter for closed fracture with routine healing: Secondary | ICD-10-CM | POA: Diagnosis not present

## 2015-12-20 DIAGNOSIS — R278 Other lack of coordination: Secondary | ICD-10-CM | POA: Diagnosis not present

## 2015-12-20 DIAGNOSIS — A419 Sepsis, unspecified organism: Secondary | ICD-10-CM | POA: Diagnosis not present

## 2015-12-20 DIAGNOSIS — R1312 Dysphagia, oropharyngeal phase: Secondary | ICD-10-CM | POA: Diagnosis not present

## 2015-12-20 DIAGNOSIS — R262 Difficulty in walking, not elsewhere classified: Secondary | ICD-10-CM | POA: Diagnosis not present

## 2015-12-20 DIAGNOSIS — M6281 Muscle weakness (generalized): Secondary | ICD-10-CM | POA: Diagnosis not present

## 2015-12-20 DIAGNOSIS — R293 Abnormal posture: Secondary | ICD-10-CM | POA: Diagnosis not present

## 2015-12-21 DIAGNOSIS — R262 Difficulty in walking, not elsewhere classified: Secondary | ICD-10-CM | POA: Diagnosis not present

## 2015-12-21 DIAGNOSIS — M6281 Muscle weakness (generalized): Secondary | ICD-10-CM | POA: Diagnosis not present

## 2015-12-21 DIAGNOSIS — R1310 Dysphagia, unspecified: Secondary | ICD-10-CM | POA: Diagnosis not present

## 2015-12-21 DIAGNOSIS — R293 Abnormal posture: Secondary | ICD-10-CM | POA: Diagnosis not present

## 2015-12-21 DIAGNOSIS — R1312 Dysphagia, oropharyngeal phase: Secondary | ICD-10-CM | POA: Diagnosis not present

## 2015-12-21 DIAGNOSIS — R278 Other lack of coordination: Secondary | ICD-10-CM | POA: Diagnosis not present

## 2015-12-21 DIAGNOSIS — S72002D Fracture of unspecified part of neck of left femur, subsequent encounter for closed fracture with routine healing: Secondary | ICD-10-CM | POA: Diagnosis not present

## 2015-12-21 DIAGNOSIS — A419 Sepsis, unspecified organism: Secondary | ICD-10-CM | POA: Diagnosis not present

## 2015-12-22 DIAGNOSIS — G4700 Insomnia, unspecified: Secondary | ICD-10-CM | POA: Diagnosis not present

## 2015-12-22 DIAGNOSIS — R1915 Other abnormal bowel sounds: Secondary | ICD-10-CM | POA: Diagnosis not present

## 2015-12-22 DIAGNOSIS — R1084 Generalized abdominal pain: Secondary | ICD-10-CM | POA: Diagnosis not present

## 2015-12-23 DIAGNOSIS — R278 Other lack of coordination: Secondary | ICD-10-CM | POA: Diagnosis not present

## 2015-12-23 DIAGNOSIS — S72002D Fracture of unspecified part of neck of left femur, subsequent encounter for closed fracture with routine healing: Secondary | ICD-10-CM | POA: Diagnosis not present

## 2015-12-23 DIAGNOSIS — A419 Sepsis, unspecified organism: Secondary | ICD-10-CM | POA: Diagnosis not present

## 2015-12-23 DIAGNOSIS — M6281 Muscle weakness (generalized): Secondary | ICD-10-CM | POA: Diagnosis not present

## 2015-12-23 DIAGNOSIS — R1312 Dysphagia, oropharyngeal phase: Secondary | ICD-10-CM | POA: Diagnosis not present

## 2015-12-23 DIAGNOSIS — R1310 Dysphagia, unspecified: Secondary | ICD-10-CM | POA: Diagnosis not present

## 2015-12-23 DIAGNOSIS — R293 Abnormal posture: Secondary | ICD-10-CM | POA: Diagnosis not present

## 2015-12-23 DIAGNOSIS — R262 Difficulty in walking, not elsewhere classified: Secondary | ICD-10-CM | POA: Diagnosis not present

## 2015-12-27 DIAGNOSIS — S72002D Fracture of unspecified part of neck of left femur, subsequent encounter for closed fracture with routine healing: Secondary | ICD-10-CM | POA: Diagnosis not present

## 2015-12-27 DIAGNOSIS — A419 Sepsis, unspecified organism: Secondary | ICD-10-CM | POA: Diagnosis not present

## 2015-12-27 DIAGNOSIS — R293 Abnormal posture: Secondary | ICD-10-CM | POA: Diagnosis not present

## 2015-12-27 DIAGNOSIS — M6281 Muscle weakness (generalized): Secondary | ICD-10-CM | POA: Diagnosis not present

## 2015-12-27 DIAGNOSIS — R278 Other lack of coordination: Secondary | ICD-10-CM | POA: Diagnosis not present

## 2015-12-27 DIAGNOSIS — Z79899 Other long term (current) drug therapy: Secondary | ICD-10-CM | POA: Diagnosis not present

## 2015-12-27 DIAGNOSIS — R1310 Dysphagia, unspecified: Secondary | ICD-10-CM | POA: Diagnosis not present

## 2015-12-27 DIAGNOSIS — R262 Difficulty in walking, not elsewhere classified: Secondary | ICD-10-CM | POA: Diagnosis not present

## 2015-12-27 DIAGNOSIS — R1312 Dysphagia, oropharyngeal phase: Secondary | ICD-10-CM | POA: Diagnosis not present

## 2015-12-29 DIAGNOSIS — R1312 Dysphagia, oropharyngeal phase: Secondary | ICD-10-CM | POA: Diagnosis not present

## 2015-12-29 DIAGNOSIS — R293 Abnormal posture: Secondary | ICD-10-CM | POA: Diagnosis not present

## 2015-12-29 DIAGNOSIS — M6281 Muscle weakness (generalized): Secondary | ICD-10-CM | POA: Diagnosis not present

## 2015-12-29 DIAGNOSIS — R1310 Dysphagia, unspecified: Secondary | ICD-10-CM | POA: Diagnosis not present

## 2015-12-29 DIAGNOSIS — R278 Other lack of coordination: Secondary | ICD-10-CM | POA: Diagnosis not present

## 2015-12-29 DIAGNOSIS — A419 Sepsis, unspecified organism: Secondary | ICD-10-CM | POA: Diagnosis not present

## 2015-12-29 DIAGNOSIS — S72002D Fracture of unspecified part of neck of left femur, subsequent encounter for closed fracture with routine healing: Secondary | ICD-10-CM | POA: Diagnosis not present

## 2015-12-29 DIAGNOSIS — R262 Difficulty in walking, not elsewhere classified: Secondary | ICD-10-CM | POA: Diagnosis not present

## 2015-12-30 DIAGNOSIS — M6281 Muscle weakness (generalized): Secondary | ICD-10-CM | POA: Diagnosis not present

## 2015-12-30 DIAGNOSIS — R1312 Dysphagia, oropharyngeal phase: Secondary | ICD-10-CM | POA: Diagnosis not present

## 2015-12-30 DIAGNOSIS — R278 Other lack of coordination: Secondary | ICD-10-CM | POA: Diagnosis not present

## 2015-12-30 DIAGNOSIS — S72002D Fracture of unspecified part of neck of left femur, subsequent encounter for closed fracture with routine healing: Secondary | ICD-10-CM | POA: Diagnosis not present

## 2015-12-30 DIAGNOSIS — R262 Difficulty in walking, not elsewhere classified: Secondary | ICD-10-CM | POA: Diagnosis not present

## 2015-12-30 DIAGNOSIS — A419 Sepsis, unspecified organism: Secondary | ICD-10-CM | POA: Diagnosis not present

## 2015-12-30 DIAGNOSIS — R293 Abnormal posture: Secondary | ICD-10-CM | POA: Diagnosis not present

## 2015-12-30 DIAGNOSIS — R1310 Dysphagia, unspecified: Secondary | ICD-10-CM | POA: Diagnosis not present

## 2016-01-02 DIAGNOSIS — A419 Sepsis, unspecified organism: Secondary | ICD-10-CM | POA: Diagnosis not present

## 2016-01-02 DIAGNOSIS — R293 Abnormal posture: Secondary | ICD-10-CM | POA: Diagnosis not present

## 2016-01-02 DIAGNOSIS — R262 Difficulty in walking, not elsewhere classified: Secondary | ICD-10-CM | POA: Diagnosis not present

## 2016-01-02 DIAGNOSIS — R278 Other lack of coordination: Secondary | ICD-10-CM | POA: Diagnosis not present

## 2016-01-02 DIAGNOSIS — S72002D Fracture of unspecified part of neck of left femur, subsequent encounter for closed fracture with routine healing: Secondary | ICD-10-CM | POA: Diagnosis not present

## 2016-01-02 DIAGNOSIS — R1310 Dysphagia, unspecified: Secondary | ICD-10-CM | POA: Diagnosis not present

## 2016-01-02 DIAGNOSIS — M6281 Muscle weakness (generalized): Secondary | ICD-10-CM | POA: Diagnosis not present

## 2016-01-02 DIAGNOSIS — R1312 Dysphagia, oropharyngeal phase: Secondary | ICD-10-CM | POA: Diagnosis not present

## 2016-01-03 DIAGNOSIS — R1312 Dysphagia, oropharyngeal phase: Secondary | ICD-10-CM | POA: Diagnosis not present

## 2016-01-03 DIAGNOSIS — M6281 Muscle weakness (generalized): Secondary | ICD-10-CM | POA: Diagnosis not present

## 2016-01-03 DIAGNOSIS — A419 Sepsis, unspecified organism: Secondary | ICD-10-CM | POA: Diagnosis not present

## 2016-01-03 DIAGNOSIS — R262 Difficulty in walking, not elsewhere classified: Secondary | ICD-10-CM | POA: Diagnosis not present

## 2016-01-03 DIAGNOSIS — R1310 Dysphagia, unspecified: Secondary | ICD-10-CM | POA: Diagnosis not present

## 2016-01-03 DIAGNOSIS — S72002D Fracture of unspecified part of neck of left femur, subsequent encounter for closed fracture with routine healing: Secondary | ICD-10-CM | POA: Diagnosis not present

## 2016-01-03 DIAGNOSIS — R293 Abnormal posture: Secondary | ICD-10-CM | POA: Diagnosis not present

## 2016-01-03 DIAGNOSIS — R278 Other lack of coordination: Secondary | ICD-10-CM | POA: Diagnosis not present

## 2016-01-04 DIAGNOSIS — A419 Sepsis, unspecified organism: Secondary | ICD-10-CM | POA: Diagnosis not present

## 2016-01-04 DIAGNOSIS — R262 Difficulty in walking, not elsewhere classified: Secondary | ICD-10-CM | POA: Diagnosis not present

## 2016-01-04 DIAGNOSIS — R293 Abnormal posture: Secondary | ICD-10-CM | POA: Diagnosis not present

## 2016-01-04 DIAGNOSIS — R1310 Dysphagia, unspecified: Secondary | ICD-10-CM | POA: Diagnosis not present

## 2016-01-04 DIAGNOSIS — R278 Other lack of coordination: Secondary | ICD-10-CM | POA: Diagnosis not present

## 2016-01-04 DIAGNOSIS — S72002D Fracture of unspecified part of neck of left femur, subsequent encounter for closed fracture with routine healing: Secondary | ICD-10-CM | POA: Diagnosis not present

## 2016-01-04 DIAGNOSIS — M6281 Muscle weakness (generalized): Secondary | ICD-10-CM | POA: Diagnosis not present

## 2016-01-04 DIAGNOSIS — R1312 Dysphagia, oropharyngeal phase: Secondary | ICD-10-CM | POA: Diagnosis not present

## 2016-01-05 DIAGNOSIS — R1310 Dysphagia, unspecified: Secondary | ICD-10-CM | POA: Diagnosis not present

## 2016-01-05 DIAGNOSIS — S72002D Fracture of unspecified part of neck of left femur, subsequent encounter for closed fracture with routine healing: Secondary | ICD-10-CM | POA: Diagnosis not present

## 2016-01-05 DIAGNOSIS — R278 Other lack of coordination: Secondary | ICD-10-CM | POA: Diagnosis not present

## 2016-01-05 DIAGNOSIS — R262 Difficulty in walking, not elsewhere classified: Secondary | ICD-10-CM | POA: Diagnosis not present

## 2016-01-05 DIAGNOSIS — R1312 Dysphagia, oropharyngeal phase: Secondary | ICD-10-CM | POA: Diagnosis not present

## 2016-01-05 DIAGNOSIS — A419 Sepsis, unspecified organism: Secondary | ICD-10-CM | POA: Diagnosis not present

## 2016-01-05 DIAGNOSIS — R293 Abnormal posture: Secondary | ICD-10-CM | POA: Diagnosis not present

## 2016-01-05 DIAGNOSIS — M6281 Muscle weakness (generalized): Secondary | ICD-10-CM | POA: Diagnosis not present

## 2016-01-06 DIAGNOSIS — M6281 Muscle weakness (generalized): Secondary | ICD-10-CM | POA: Diagnosis not present

## 2016-01-06 DIAGNOSIS — R278 Other lack of coordination: Secondary | ICD-10-CM | POA: Diagnosis not present

## 2016-01-06 DIAGNOSIS — R1310 Dysphagia, unspecified: Secondary | ICD-10-CM | POA: Diagnosis not present

## 2016-01-06 DIAGNOSIS — R1312 Dysphagia, oropharyngeal phase: Secondary | ICD-10-CM | POA: Diagnosis not present

## 2016-01-06 DIAGNOSIS — R293 Abnormal posture: Secondary | ICD-10-CM | POA: Diagnosis not present

## 2016-01-06 DIAGNOSIS — A419 Sepsis, unspecified organism: Secondary | ICD-10-CM | POA: Diagnosis not present

## 2016-01-06 DIAGNOSIS — R262 Difficulty in walking, not elsewhere classified: Secondary | ICD-10-CM | POA: Diagnosis not present

## 2016-01-06 DIAGNOSIS — S72002D Fracture of unspecified part of neck of left femur, subsequent encounter for closed fracture with routine healing: Secondary | ICD-10-CM | POA: Diagnosis not present

## 2016-01-09 DIAGNOSIS — R278 Other lack of coordination: Secondary | ICD-10-CM | POA: Diagnosis not present

## 2016-01-09 DIAGNOSIS — R293 Abnormal posture: Secondary | ICD-10-CM | POA: Diagnosis not present

## 2016-01-09 DIAGNOSIS — M6281 Muscle weakness (generalized): Secondary | ICD-10-CM | POA: Diagnosis not present

## 2016-01-09 DIAGNOSIS — S72002D Fracture of unspecified part of neck of left femur, subsequent encounter for closed fracture with routine healing: Secondary | ICD-10-CM | POA: Diagnosis not present

## 2016-01-09 DIAGNOSIS — R262 Difficulty in walking, not elsewhere classified: Secondary | ICD-10-CM | POA: Diagnosis not present

## 2016-01-09 DIAGNOSIS — R1310 Dysphagia, unspecified: Secondary | ICD-10-CM | POA: Diagnosis not present

## 2016-01-09 DIAGNOSIS — A419 Sepsis, unspecified organism: Secondary | ICD-10-CM | POA: Diagnosis not present

## 2016-01-09 DIAGNOSIS — R1312 Dysphagia, oropharyngeal phase: Secondary | ICD-10-CM | POA: Diagnosis not present

## 2016-01-10 DIAGNOSIS — M79675 Pain in left toe(s): Secondary | ICD-10-CM | POA: Diagnosis not present

## 2016-01-10 DIAGNOSIS — R1312 Dysphagia, oropharyngeal phase: Secondary | ICD-10-CM | POA: Diagnosis not present

## 2016-01-10 DIAGNOSIS — M79674 Pain in right toe(s): Secondary | ICD-10-CM | POA: Diagnosis not present

## 2016-01-10 DIAGNOSIS — B351 Tinea unguium: Secondary | ICD-10-CM | POA: Diagnosis not present

## 2016-01-10 DIAGNOSIS — R262 Difficulty in walking, not elsewhere classified: Secondary | ICD-10-CM | POA: Diagnosis not present

## 2016-01-10 DIAGNOSIS — A419 Sepsis, unspecified organism: Secondary | ICD-10-CM | POA: Diagnosis not present

## 2016-01-10 DIAGNOSIS — S72002D Fracture of unspecified part of neck of left femur, subsequent encounter for closed fracture with routine healing: Secondary | ICD-10-CM | POA: Diagnosis not present

## 2016-01-10 DIAGNOSIS — R293 Abnormal posture: Secondary | ICD-10-CM | POA: Diagnosis not present

## 2016-01-10 DIAGNOSIS — M6281 Muscle weakness (generalized): Secondary | ICD-10-CM | POA: Diagnosis not present

## 2016-01-10 DIAGNOSIS — R278 Other lack of coordination: Secondary | ICD-10-CM | POA: Diagnosis not present

## 2016-01-10 DIAGNOSIS — R1310 Dysphagia, unspecified: Secondary | ICD-10-CM | POA: Diagnosis not present

## 2016-01-11 DIAGNOSIS — R1312 Dysphagia, oropharyngeal phase: Secondary | ICD-10-CM | POA: Diagnosis not present

## 2016-01-11 DIAGNOSIS — R262 Difficulty in walking, not elsewhere classified: Secondary | ICD-10-CM | POA: Diagnosis not present

## 2016-01-11 DIAGNOSIS — A419 Sepsis, unspecified organism: Secondary | ICD-10-CM | POA: Diagnosis not present

## 2016-01-11 DIAGNOSIS — R293 Abnormal posture: Secondary | ICD-10-CM | POA: Diagnosis not present

## 2016-01-11 DIAGNOSIS — R1310 Dysphagia, unspecified: Secondary | ICD-10-CM | POA: Diagnosis not present

## 2016-01-11 DIAGNOSIS — S72002D Fracture of unspecified part of neck of left femur, subsequent encounter for closed fracture with routine healing: Secondary | ICD-10-CM | POA: Diagnosis not present

## 2016-01-11 DIAGNOSIS — M6281 Muscle weakness (generalized): Secondary | ICD-10-CM | POA: Diagnosis not present

## 2016-01-11 DIAGNOSIS — R278 Other lack of coordination: Secondary | ICD-10-CM | POA: Diagnosis not present

## 2016-01-12 DIAGNOSIS — A419 Sepsis, unspecified organism: Secondary | ICD-10-CM | POA: Diagnosis not present

## 2016-01-12 DIAGNOSIS — M6281 Muscle weakness (generalized): Secondary | ICD-10-CM | POA: Diagnosis not present

## 2016-01-12 DIAGNOSIS — R293 Abnormal posture: Secondary | ICD-10-CM | POA: Diagnosis not present

## 2016-01-12 DIAGNOSIS — S72002D Fracture of unspecified part of neck of left femur, subsequent encounter for closed fracture with routine healing: Secondary | ICD-10-CM | POA: Diagnosis not present

## 2016-01-12 DIAGNOSIS — G4700 Insomnia, unspecified: Secondary | ICD-10-CM | POA: Diagnosis not present

## 2016-01-12 DIAGNOSIS — R1084 Generalized abdominal pain: Secondary | ICD-10-CM | POA: Diagnosis not present

## 2016-01-12 DIAGNOSIS — R1915 Other abnormal bowel sounds: Secondary | ICD-10-CM | POA: Diagnosis not present

## 2016-01-12 DIAGNOSIS — R262 Difficulty in walking, not elsewhere classified: Secondary | ICD-10-CM | POA: Diagnosis not present

## 2016-01-12 DIAGNOSIS — R1310 Dysphagia, unspecified: Secondary | ICD-10-CM | POA: Diagnosis not present

## 2016-01-12 DIAGNOSIS — R1312 Dysphagia, oropharyngeal phase: Secondary | ICD-10-CM | POA: Diagnosis not present

## 2016-01-12 DIAGNOSIS — R278 Other lack of coordination: Secondary | ICD-10-CM | POA: Diagnosis not present

## 2016-01-13 DIAGNOSIS — A419 Sepsis, unspecified organism: Secondary | ICD-10-CM | POA: Diagnosis not present

## 2016-01-13 DIAGNOSIS — S72002D Fracture of unspecified part of neck of left femur, subsequent encounter for closed fracture with routine healing: Secondary | ICD-10-CM | POA: Diagnosis not present

## 2016-01-13 DIAGNOSIS — R262 Difficulty in walking, not elsewhere classified: Secondary | ICD-10-CM | POA: Diagnosis not present

## 2016-01-13 DIAGNOSIS — R1310 Dysphagia, unspecified: Secondary | ICD-10-CM | POA: Diagnosis not present

## 2016-01-13 DIAGNOSIS — R1312 Dysphagia, oropharyngeal phase: Secondary | ICD-10-CM | POA: Diagnosis not present

## 2016-01-13 DIAGNOSIS — R293 Abnormal posture: Secondary | ICD-10-CM | POA: Diagnosis not present

## 2016-01-13 DIAGNOSIS — R278 Other lack of coordination: Secondary | ICD-10-CM | POA: Diagnosis not present

## 2016-01-13 DIAGNOSIS — M6281 Muscle weakness (generalized): Secondary | ICD-10-CM | POA: Diagnosis not present

## 2016-01-16 DIAGNOSIS — S72002D Fracture of unspecified part of neck of left femur, subsequent encounter for closed fracture with routine healing: Secondary | ICD-10-CM | POA: Diagnosis not present

## 2016-01-16 DIAGNOSIS — R1310 Dysphagia, unspecified: Secondary | ICD-10-CM | POA: Diagnosis not present

## 2016-01-16 DIAGNOSIS — R278 Other lack of coordination: Secondary | ICD-10-CM | POA: Diagnosis not present

## 2016-01-16 DIAGNOSIS — R293 Abnormal posture: Secondary | ICD-10-CM | POA: Diagnosis not present

## 2016-01-16 DIAGNOSIS — R262 Difficulty in walking, not elsewhere classified: Secondary | ICD-10-CM | POA: Diagnosis not present

## 2016-01-16 DIAGNOSIS — R1312 Dysphagia, oropharyngeal phase: Secondary | ICD-10-CM | POA: Diagnosis not present

## 2016-01-16 DIAGNOSIS — A419 Sepsis, unspecified organism: Secondary | ICD-10-CM | POA: Diagnosis not present

## 2016-01-16 DIAGNOSIS — M6281 Muscle weakness (generalized): Secondary | ICD-10-CM | POA: Diagnosis not present

## 2016-01-18 DIAGNOSIS — R293 Abnormal posture: Secondary | ICD-10-CM | POA: Diagnosis not present

## 2016-01-18 DIAGNOSIS — R262 Difficulty in walking, not elsewhere classified: Secondary | ICD-10-CM | POA: Diagnosis not present

## 2016-01-18 DIAGNOSIS — R278 Other lack of coordination: Secondary | ICD-10-CM | POA: Diagnosis not present

## 2016-01-18 DIAGNOSIS — R1310 Dysphagia, unspecified: Secondary | ICD-10-CM | POA: Diagnosis not present

## 2016-01-18 DIAGNOSIS — R1312 Dysphagia, oropharyngeal phase: Secondary | ICD-10-CM | POA: Diagnosis not present

## 2016-01-18 DIAGNOSIS — M6281 Muscle weakness (generalized): Secondary | ICD-10-CM | POA: Diagnosis not present

## 2016-01-18 DIAGNOSIS — A419 Sepsis, unspecified organism: Secondary | ICD-10-CM | POA: Diagnosis not present

## 2016-01-18 DIAGNOSIS — S72002D Fracture of unspecified part of neck of left femur, subsequent encounter for closed fracture with routine healing: Secondary | ICD-10-CM | POA: Diagnosis not present

## 2016-01-19 DIAGNOSIS — S72002D Fracture of unspecified part of neck of left femur, subsequent encounter for closed fracture with routine healing: Secondary | ICD-10-CM | POA: Diagnosis not present

## 2016-01-19 DIAGNOSIS — R262 Difficulty in walking, not elsewhere classified: Secondary | ICD-10-CM | POA: Diagnosis not present

## 2016-01-19 DIAGNOSIS — R1310 Dysphagia, unspecified: Secondary | ICD-10-CM | POA: Diagnosis not present

## 2016-01-19 DIAGNOSIS — R1312 Dysphagia, oropharyngeal phase: Secondary | ICD-10-CM | POA: Diagnosis not present

## 2016-01-19 DIAGNOSIS — R278 Other lack of coordination: Secondary | ICD-10-CM | POA: Diagnosis not present

## 2016-01-19 DIAGNOSIS — M6281 Muscle weakness (generalized): Secondary | ICD-10-CM | POA: Diagnosis not present

## 2016-01-19 DIAGNOSIS — A419 Sepsis, unspecified organism: Secondary | ICD-10-CM | POA: Diagnosis not present

## 2016-01-19 DIAGNOSIS — R293 Abnormal posture: Secondary | ICD-10-CM | POA: Diagnosis not present

## 2016-01-23 DIAGNOSIS — Z79899 Other long term (current) drug therapy: Secondary | ICD-10-CM | POA: Diagnosis not present

## 2016-01-23 DIAGNOSIS — D649 Anemia, unspecified: Secondary | ICD-10-CM | POA: Diagnosis not present

## 2016-01-23 DIAGNOSIS — M6281 Muscle weakness (generalized): Secondary | ICD-10-CM | POA: Diagnosis not present

## 2016-01-23 DIAGNOSIS — R319 Hematuria, unspecified: Secondary | ICD-10-CM | POA: Diagnosis not present

## 2016-01-23 DIAGNOSIS — N390 Urinary tract infection, site not specified: Secondary | ICD-10-CM | POA: Diagnosis not present

## 2016-01-23 DIAGNOSIS — N183 Chronic kidney disease, stage 3 (moderate): Secondary | ICD-10-CM | POA: Diagnosis not present

## 2016-01-26 DIAGNOSIS — W19XXXA Unspecified fall, initial encounter: Secondary | ICD-10-CM | POA: Diagnosis not present

## 2016-01-26 DIAGNOSIS — N390 Urinary tract infection, site not specified: Secondary | ICD-10-CM | POA: Diagnosis not present

## 2016-01-26 DIAGNOSIS — N183 Chronic kidney disease, stage 3 (moderate): Secondary | ICD-10-CM | POA: Diagnosis not present

## 2016-01-26 DIAGNOSIS — I959 Hypotension, unspecified: Secondary | ICD-10-CM | POA: Diagnosis not present

## 2016-01-30 DIAGNOSIS — N183 Chronic kidney disease, stage 3 (moderate): Secondary | ICD-10-CM | POA: Diagnosis not present

## 2016-01-30 DIAGNOSIS — N390 Urinary tract infection, site not specified: Secondary | ICD-10-CM | POA: Diagnosis not present

## 2016-01-30 DIAGNOSIS — I959 Hypotension, unspecified: Secondary | ICD-10-CM | POA: Diagnosis not present

## 2016-01-30 DIAGNOSIS — G4700 Insomnia, unspecified: Secondary | ICD-10-CM | POA: Diagnosis not present

## 2016-02-01 DIAGNOSIS — A419 Sepsis, unspecified organism: Secondary | ICD-10-CM | POA: Diagnosis not present

## 2016-02-01 DIAGNOSIS — S72002D Fracture of unspecified part of neck of left femur, subsequent encounter for closed fracture with routine healing: Secondary | ICD-10-CM | POA: Diagnosis not present

## 2016-02-01 DIAGNOSIS — R4701 Aphasia: Secondary | ICD-10-CM | POA: Diagnosis not present

## 2016-02-01 DIAGNOSIS — R1312 Dysphagia, oropharyngeal phase: Secondary | ICD-10-CM | POA: Diagnosis not present

## 2016-02-03 DIAGNOSIS — S72002D Fracture of unspecified part of neck of left femur, subsequent encounter for closed fracture with routine healing: Secondary | ICD-10-CM | POA: Diagnosis not present

## 2016-02-03 DIAGNOSIS — R69 Illness, unspecified: Secondary | ICD-10-CM | POA: Diagnosis not present

## 2016-02-03 DIAGNOSIS — R4701 Aphasia: Secondary | ICD-10-CM | POA: Diagnosis not present

## 2016-02-03 DIAGNOSIS — R1312 Dysphagia, oropharyngeal phase: Secondary | ICD-10-CM | POA: Diagnosis not present

## 2016-02-03 DIAGNOSIS — A419 Sepsis, unspecified organism: Secondary | ICD-10-CM | POA: Diagnosis not present

## 2016-02-04 DIAGNOSIS — R4701 Aphasia: Secondary | ICD-10-CM | POA: Diagnosis not present

## 2016-02-04 DIAGNOSIS — A419 Sepsis, unspecified organism: Secondary | ICD-10-CM | POA: Diagnosis not present

## 2016-02-04 DIAGNOSIS — S72002D Fracture of unspecified part of neck of left femur, subsequent encounter for closed fracture with routine healing: Secondary | ICD-10-CM | POA: Diagnosis not present

## 2016-02-04 DIAGNOSIS — R1312 Dysphagia, oropharyngeal phase: Secondary | ICD-10-CM | POA: Diagnosis not present

## 2016-02-05 DIAGNOSIS — R1312 Dysphagia, oropharyngeal phase: Secondary | ICD-10-CM | POA: Diagnosis not present

## 2016-02-05 DIAGNOSIS — A419 Sepsis, unspecified organism: Secondary | ICD-10-CM | POA: Diagnosis not present

## 2016-02-05 DIAGNOSIS — S72002D Fracture of unspecified part of neck of left femur, subsequent encounter for closed fracture with routine healing: Secondary | ICD-10-CM | POA: Diagnosis not present

## 2016-02-05 DIAGNOSIS — R4701 Aphasia: Secondary | ICD-10-CM | POA: Diagnosis not present

## 2016-02-06 DIAGNOSIS — A419 Sepsis, unspecified organism: Secondary | ICD-10-CM | POA: Diagnosis not present

## 2016-02-06 DIAGNOSIS — R4701 Aphasia: Secondary | ICD-10-CM | POA: Diagnosis not present

## 2016-02-06 DIAGNOSIS — S72002D Fracture of unspecified part of neck of left femur, subsequent encounter for closed fracture with routine healing: Secondary | ICD-10-CM | POA: Diagnosis not present

## 2016-02-06 DIAGNOSIS — R1312 Dysphagia, oropharyngeal phase: Secondary | ICD-10-CM | POA: Diagnosis not present

## 2016-02-08 DIAGNOSIS — S72002D Fracture of unspecified part of neck of left femur, subsequent encounter for closed fracture with routine healing: Secondary | ICD-10-CM | POA: Diagnosis not present

## 2016-02-08 DIAGNOSIS — R1312 Dysphagia, oropharyngeal phase: Secondary | ICD-10-CM | POA: Diagnosis not present

## 2016-02-08 DIAGNOSIS — A419 Sepsis, unspecified organism: Secondary | ICD-10-CM | POA: Diagnosis not present

## 2016-02-08 DIAGNOSIS — R4701 Aphasia: Secondary | ICD-10-CM | POA: Diagnosis not present

## 2016-02-09 DIAGNOSIS — S72002D Fracture of unspecified part of neck of left femur, subsequent encounter for closed fracture with routine healing: Secondary | ICD-10-CM | POA: Diagnosis not present

## 2016-02-09 DIAGNOSIS — A419 Sepsis, unspecified organism: Secondary | ICD-10-CM | POA: Diagnosis not present

## 2016-02-09 DIAGNOSIS — R1312 Dysphagia, oropharyngeal phase: Secondary | ICD-10-CM | POA: Diagnosis not present

## 2016-02-09 DIAGNOSIS — R4701 Aphasia: Secondary | ICD-10-CM | POA: Diagnosis not present

## 2016-02-10 DIAGNOSIS — A419 Sepsis, unspecified organism: Secondary | ICD-10-CM | POA: Diagnosis not present

## 2016-02-10 DIAGNOSIS — S72002D Fracture of unspecified part of neck of left femur, subsequent encounter for closed fracture with routine healing: Secondary | ICD-10-CM | POA: Diagnosis not present

## 2016-02-10 DIAGNOSIS — R4701 Aphasia: Secondary | ICD-10-CM | POA: Diagnosis not present

## 2016-02-10 DIAGNOSIS — R1312 Dysphagia, oropharyngeal phase: Secondary | ICD-10-CM | POA: Diagnosis not present

## 2016-02-13 ENCOUNTER — Emergency Department (HOSPITAL_COMMUNITY): Payer: Medicare Other

## 2016-02-13 ENCOUNTER — Encounter (HOSPITAL_COMMUNITY): Payer: Self-pay

## 2016-02-13 ENCOUNTER — Emergency Department (HOSPITAL_COMMUNITY)
Admission: EM | Admit: 2016-02-13 | Discharge: 2016-02-14 | Disposition: A | Discharge status: Home / Self Care | Payer: Medicare Other | Attending: Emergency Medicine | Admitting: Emergency Medicine

## 2016-02-13 DIAGNOSIS — W19XXXA Unspecified fall, initial encounter: Secondary | ICD-10-CM

## 2016-02-13 DIAGNOSIS — Z7982 Long term (current) use of aspirin: Secondary | ICD-10-CM | POA: Diagnosis not present

## 2016-02-13 DIAGNOSIS — Z87891 Personal history of nicotine dependence: Secondary | ICD-10-CM | POA: Insufficient documentation

## 2016-02-13 DIAGNOSIS — G309 Alzheimer's disease, unspecified: Secondary | ICD-10-CM | POA: Diagnosis not present

## 2016-02-13 DIAGNOSIS — T1490XA Injury, unspecified, initial encounter: Secondary | ICD-10-CM | POA: Diagnosis not present

## 2016-02-13 DIAGNOSIS — Y92129 Unspecified place in nursing home as the place of occurrence of the external cause: Secondary | ICD-10-CM

## 2016-02-13 DIAGNOSIS — R4701 Aphasia: Secondary | ICD-10-CM | POA: Diagnosis not present

## 2016-02-13 DIAGNOSIS — I129 Hypertensive chronic kidney disease with stage 1 through stage 4 chronic kidney disease, or unspecified chronic kidney disease: Secondary | ICD-10-CM | POA: Insufficient documentation

## 2016-02-13 DIAGNOSIS — N183 Chronic kidney disease, stage 3 (moderate): Secondary | ICD-10-CM | POA: Insufficient documentation

## 2016-02-13 DIAGNOSIS — S72002D Fracture of unspecified part of neck of left femur, subsequent encounter for closed fracture with routine healing: Secondary | ICD-10-CM | POA: Diagnosis not present

## 2016-02-13 DIAGNOSIS — S0990XA Unspecified injury of head, initial encounter: Secondary | ICD-10-CM | POA: Diagnosis not present

## 2016-02-13 DIAGNOSIS — A419 Sepsis, unspecified organism: Secondary | ICD-10-CM | POA: Diagnosis not present

## 2016-02-13 DIAGNOSIS — S199XXA Unspecified injury of neck, initial encounter: Secondary | ICD-10-CM | POA: Diagnosis not present

## 2016-02-13 DIAGNOSIS — M79651 Pain in right thigh: Secondary | ICD-10-CM | POA: Diagnosis not present

## 2016-02-13 DIAGNOSIS — Z043 Encounter for examination and observation following other accident: Secondary | ICD-10-CM | POA: Diagnosis not present

## 2016-02-13 DIAGNOSIS — R1312 Dysphagia, oropharyngeal phase: Secondary | ICD-10-CM | POA: Diagnosis not present

## 2016-02-13 DIAGNOSIS — M25551 Pain in right hip: Secondary | ICD-10-CM | POA: Diagnosis not present

## 2016-02-13 DIAGNOSIS — M25511 Pain in right shoulder: Secondary | ICD-10-CM | POA: Diagnosis not present

## 2016-02-13 DIAGNOSIS — I2510 Atherosclerotic heart disease of native coronary artery without angina pectoris: Secondary | ICD-10-CM | POA: Insufficient documentation

## 2016-02-13 DIAGNOSIS — S22080D Wedge compression fracture of T11-T12 vertebra, subsequent encounter for fracture with routine healing: Secondary | ICD-10-CM | POA: Diagnosis not present

## 2016-02-13 DIAGNOSIS — M25552 Pain in left hip: Secondary | ICD-10-CM | POA: Diagnosis not present

## 2016-02-13 DIAGNOSIS — S3992XA Unspecified injury of lower back, initial encounter: Secondary | ICD-10-CM | POA: Diagnosis not present

## 2016-02-13 DIAGNOSIS — Z955 Presence of coronary angioplasty implant and graft: Secondary | ICD-10-CM | POA: Diagnosis not present

## 2016-02-13 DIAGNOSIS — Z79899 Other long term (current) drug therapy: Secondary | ICD-10-CM | POA: Diagnosis not present

## 2016-02-13 DIAGNOSIS — S299XXA Unspecified injury of thorax, initial encounter: Secondary | ICD-10-CM | POA: Diagnosis not present

## 2016-02-13 DIAGNOSIS — S79921A Unspecified injury of right thigh, initial encounter: Secondary | ICD-10-CM | POA: Diagnosis not present

## 2016-02-13 DIAGNOSIS — G44329 Chronic post-traumatic headache, not intractable: Secondary | ICD-10-CM | POA: Diagnosis not present

## 2016-02-13 NOTE — ED Provider Notes (Signed)
[facility] DEPT Provider Note   CSN: [id number] Arrival date & time: 02/13/16  1925     History   Chief Complaint Chief Complaint  Patient presents with  . Fall    HPI David Collier is a 82 y.o. male.  The history is provided by the EMS personnel, the nursing home and the patient. The history is limited by the condition of the patient (Hx dementia).  Fall   Pt was seen at [facility]. Per EMS, NH report, pt: NH states pt fell from his wheelchair PTA. Pt has significant hx of dementia and currently denies any complaints.   Past Medical History:  Diagnosis Date  . Alzheimer's dementia   . Anginal pain (HCC)    NONE IN 1 MONTH   . 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 CAD (coronary artery disease), native coronary artery    Initially diagnosed 2003 2006 catheterization with bare metal stenting of the first marginal branch of the circumflex and continuation branch of the circumflex 2010 catheterization showing normal left main, 50-60% LAD unchanged from before, proximal stent of marginal patent, severe in-stent restenosis treated with cutting balloon angioplasty, patent stent in continuation branch with mild in-stent restenosis, occluded nondominant right coronary artery.   . CKD (chronic kidney disease), stage III   . Coronary atherosclerosis of native coronary artery    BMS circumflex and OM 2006, PTCA ISR 2010, moderate LAD disease  . Depression   . DJD (degenerative joint disease)   . Headache   . History of stroke    Seen radiographically-left PICA; unknown to patient.  . Hyperlipidemia   . Hypertension   . Macular degeneration   . Myocardial infarction    x5    1980s  sees DR. MICHAEL COOPER  . Seizures ([place])   . Stroke ([place])   . TIA (transient ischemic attack) 03/05/2014    Patient Active Problem List   Diagnosis Date Noted  . Bacteremia 10/08/2015  . Sepsis ([place]) 10/06/2015  . UTI (lower urinary tract infection) 10/06/2015  . Acute renal  failure superimposed on stage 3 chronic kidney disease ([place]) 10/06/2015  . Acute blood loss anemia 09/18/2015  . Closed left hip fracture ([place]) 09/17/2015  . Trochanteric fracture of right femur ([place]) 06/24/2015  . Community acquired pneumonia 06/24/2015  . Hip fracture ([place]) 06/24/2015  . Obesity (BMI 30-39.9) 07/11/2014  . Dementia 07/11/2014  . Unstable angina pectoris ([place]) 07/11/2014  . 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 CKD (chronic kidney disease), stage III 03/05/2014  . Macular degeneration (senile) of retina 02/16/2009  . Essential hypertension 02/16/2009  . Hyperlipidemia   . CAD (coronary artery disease), native coronary artery     Past Surgical History:  Procedure Laterality Date  . APPENDECTOMY    . CARDIAC CATHETERIZATION     3 STENTS  AT  [place]  80S OR 90S  . CHOLECYSTECTOMY    . CORONARY ANGIOPLASTY    . FEMUR IM NAIL Left 09/17/2015   Procedure: INTRAMEDULLARY (IM) NAIL FEMORAL;  Surgeon: Dorna Leitz, MD;  Location: [facility];  Service: Orthopedics;  Laterality: Left;  . INTRAMEDULLARY (IM) NAIL INTERTROCHANTERIC Right 06/25/2015   Procedure: INTRAMEDULLARY (IM) NAIL INTERTROCHANTRIC;  Surgeon: Dorna Leitz, MD;  Location: [facility];  Service: Orthopedics;  Laterality: Right;  . MULTIPLE EXTRACTIONS WITH ALVEOLOPLASTY N/A 10/22/2014   Procedure: MULTIPLE EXTRACTIONS  # 4,6, 7, 8, 10, 21, 22, 23, 24,  25, 26, 27, 28 and ALVEOLOPLASTY;  Surgeon: Diona Browner, DDS;  Location: [facility];  Service: Oral Surgery;  Laterality: N/A;       Home Medications    Prior to Admission medications   Medication Sig Start Date End Date Taking? Authorizing Provider  acetaminophen (TYLENOL) 325 MG tablet Take 2 tablets (650 mg total) by mouth every 6 (six) hours as needed for mild pain (or Fever >/= 101). 09/20/15   Velvet Bathe, MD  albuterol (PROVENTIL) (2.5  MG/3ML) 0.083% nebulizer solution Take 2.5 mg by nebulization every 6 (six) hours as needed for wheezing or shortness of breath.    Historical Provider, MD  aspirin EC 325 MG EC tablet Take 1 tablet (325 mg total) by mouth daily with breakfast. 09/20/15   Velvet Bathe, MD  cyanocobalamin 500 MCG tablet Take 500 mcg by mouth daily.    Historical Provider, MD  divalproex (DEPAKOTE SPRINKLE) 125 MG capsule Take 6 capsules (750 mg total) by mouth at bedtime. 10/10/15   Erline Hau, MD  feeding supplement, ENSURE ENLIVE, (ENSURE ENLIVE) LIQD Take 237 mLs by mouth 2 (two) times daily between meals. 09/20/15   Velvet Bathe, MD  ferrous sulfate 325 (65 FE) MG tablet Take 325 mg by mouth daily with breakfast.    Historical Provider, MD  fluticasone (VERAMYST) 27.5 MCG/SPRAY nasal spray Place 1 spray into the nose 2 (two) times daily.    Historical Provider, MD  HYDROcodone-acetaminophen (NORCO) 5-325 MG tablet Take 1 tablet by mouth every 6 (six) hours as needed for moderate pain. 09/17/15   Gary Fleet, PA-C  Melatonin 3 MG CAPS Take 3 mg by mouth at bedtime.    Historical Provider, MD  metoprolol (LOPRESSOR) 25 MG tablet Take 0.5 tablets (12.5 mg total) by mouth 2 (two) times daily. 09/20/15   Velvet Bathe, MD  NITROSTAT 0.4 MG SL tablet DISSOLVE ONE TABLET UNDER THE TONGUE EVERY 5 MINUTES AS NEEDED FOR CHEST PAIN 04/12/14   [name], MD  pantoprazole (PROTONIX) 20 MG tablet Take 1 tablet (20 mg total) by mouth daily. 09/06/15   Daleen Bo, MD  sertraline (ZOLOFT) 100 MG tablet Take 100 mg by mouth daily.    Historical Provider, MD  Skin Protectants, Misc. (EUCERIN) cream Apply 1 application topically 2 (two) times daily.    Historical Provider, MD    Family History Family History  Problem Relation Age of Onset  . Heart failure Father   . Heart attack Father   . Heart failure Mother   . CAD Brother   . CAD Brother   . Cancer Brother   . Cancer Brother   . Hypertension Sister     . Heart attack Sister   . Heart failure      Social History Social History  Substance Use Topics  . Smoking status: Former Smoker    Types: Cigarettes    Quit date: 04/23/1966  . Smokeless tobacco: Never Used  . Alcohol use No     Allergies   Aricept [donepezil hcl]; Ativan [lorazepam]; Atorvastatin; Namenda [memantine]; Other; Simvastatin; and Sorbitan   Review of Systems Review of Systems  Unable to perform ROS: Dementia     Physical Exam Updated Vital Signs BP 135/79 (BP Location: Left Arm)   Pulse 90   Temp 98.1 F (36.7 C) (Oral)   Resp 16   SpO2 100%   Physical Exam 1940: Physical examination:  Nursing notes reviewed; Vital signs and O2 SAT reviewed;  Constitutional: Well developed,  Well nourished, Well hydrated, In no acute distress; Head:  Normocephalic, atraumatic; Eyes: EOMI, PERRL, No scleral icterus; ENMT: Mouth and pharynx normal, Mucous membranes moist; Neck: Supple, Full range of motion, No lymphadenopathy; Cardiovascular: Regular rate and rhythm, No gallop; Respiratory: Breath sounds clear & equal bilaterally, No wheezes.  Speaking full sentences with ease, Normal respiratory effort/excursion; Chest: Nontender, Movement normal; Abdomen: Soft, Nontender, Nondistended, Normal bowel sounds; Genitourinary: No CVA tenderness; Spine:  No midline CS, TS, LS tenderness. +TTP left lumbar paraspinal muscles.;; Extremities: Pulses normal, Pelvis stable. No deformity. No tenderness, No edema, No calf edema or asymmetry. FROM bilat UE's and LE's without TTP or c/o pain.; Neuro: Awake, alert, confused per hx dementia. Major CN grossly intact. No facial droop. Speech clear. Grips equal. Moves all extremities spontaneously and to command without apparent gross focal motor deficits.; Skin: Color normal, Warm, Dry.   ED Treatments / Results  Labs (all labs ordered are listed, but only abnormal results are displayed)   EKG  EKG Interpretation None        Radiology   Procedures Procedures (including critical care time)  Medications Ordered in ED Medications - No data to display   Initial Impression / Assessment and Plan / ED Course  I have reviewed the triage vital signs and the nursing notes.  Pertinent labs & imaging results that were available during my care of the patient were reviewed by me and considered in my medical decision making (see chart for details).  MDM Reviewed: previous chart, nursing note and vitals Interpretation: x-ray and CT scan    Dg Chest 2 View Result Date: 02/13/2016 CLINICAL DATA:  82 year old male with fall. EXAM: CHEST  2 VIEW COMPARISON:  Chest radiograph dated 11/29/2015 FINDINGS: Minimal left lung base linear and platelike atelectatic changes. The lungs are otherwise clear. There is no pleural effusion or pneumothorax. The cardiac silhouette is within normal limits. There is slight prominence of the hila likely superimposition of the vessels. This appearance is similar to prior study. There is osteopenia with degenerative changes of the spine. Compression deformity and anterior wedging of lower thoracic vertebra with resulting kyphosis similar to prior radiograph. No definite acute fracture identified. Dedicated rib series may provide better evaluation if there is clinical concern for rib fracture. Right upper quadrant cholecystectomy clips. IMPRESSION: No active cardiopulmonary disease. Electronically Signed   By: Anner Crete M.D.   On: 02/13/2016 21:26   Dg Lumbar Spine Complete Result Date: 02/13/2016 CLINICAL DATA:  Patient fell from wheelchair. EXAM: LUMBAR SPINE - COMPLETE 4+ VIEW COMPARISON:  Reformats of the dorsal spine from a CT dated 08/19/2013. FINDINGS: The bones are demineralized. New since 2015 is a 75% anterior compression fracture of T10. T12 through S1 facet arthropathy most prominent at L4 through S1. No spondylolysis nor spondylolisthesis. Surgical clips in the right upper  quadrant. IMPRESSION: Osteopenic appearance of the lumbar spine. New since comparison 08/19/2013 CT is an approximately 75% compression fracture of T10 without definite retropulsion. Multilevel facet arthropathy. No acute appearing lumbar spine fracture. Electronically Signed   By: Ashley Royalty M.D.   On: 02/13/2016 21:32   Dg Pelvis 1-2 Views Result Date: 02/13/2016 CLINICAL DATA:  Patient fell from wheelchair. Bilateral hip tenderness. EXAM: PELVIS - 1-2 VIEW COMPARISON:  Fluoroscopic images of the hips, on the right from 06/25/2015 and on the left from 09/17/2015 FINDINGS: Bilateral femoral nailing. Sclerosis at the base of the femoral necks bilaterally along the medial aspect with slight increase in varus angulation is  noted suggesting a component of femoral neck impaction. This may be chronic given the degree of sclerosis seen bilaterally. Subtle callus formation about a nondisplaced right inferior pubic ramus fracture, not apparent on the fluoroscopic study from 06/25/2015. There is lower lumbar degenerative facet arthropathy with facet hypertrophy from L3 caudad to S1. Numerous phleboliths within the pelvis bilaterally left greater than right. IMPRESSION: Subtle increase in varus angulation of both proximal femora with sclerosis along the base of both femoral necks medially. Findings have the appearance of subtle impaction fractures and given the degree of sclerosis at the site of subtle impaction fractures, more likely to be chronic. No hardware failure. Subacute appearing fracture with subtle callus formation involving the right inferior pubic ramus. Electronically Signed   By: Ashley Royalty M.D.   On: 02/13/2016 21:39   Ct Head Wo Contrast Result Date: 02/13/2016 CLINICAL DATA:  82 year old male with fall EXAM: CT HEAD WITHOUT CONTRAST CT CERVICAL SPINE WITHOUT CONTRAST TECHNIQUE: Multidetector CT imaging of the head and cervical spine was performed following the standard protocol without intravenous  contrast. Multiplanar CT image reconstructions of the cervical spine were also generated. COMPARISON:  Head CT dated 11/29/2015 FINDINGS: Evaluation of this exam is limited due to motion artifact. CT HEAD FINDINGS Brain: There is mild moderate age-related atrophy and chronic microvascular ischemic changes. No acute intracranial hemorrhage. No mass effect or midline shift. No extra-axial fluid collection. Vascular: No hyperdense vessel or unexpected calcification. Skull: Normal. Negative for fracture or focal lesion. Sinuses/Orbits: No acute finding. Other: None CT CERVICAL SPINE FINDINGS Alignment: No acute subluxation. Skull base and vertebrae: No acute fracture. No primary bone lesion or focal pathologic process. Soft tissues and spinal canal: No prevertebral fluid or swelling. No visible canal hematoma. Disc levels: There is degenerative changes and endplate irregularity primarily at C5-C6 and C6-C7. Multilevel facet hypertrophy most prominent at C3-C4 on the left with facet osteophyte complex and associated neural foramina narrowing. Upper chest: Negative. Other: None IMPRESSION: No acute intracranial hemorrhage. No acute/ traumatic cervical spine pathology. Electronically Signed   By: Anner Crete M.D.   On: 02/13/2016 22:08   Ct Cervical Spine Wo Contrast Result Date: 02/13/2016 CLINICAL DATA:  82 year old male with fall EXAM: CT HEAD WITHOUT CONTRAST CT CERVICAL SPINE WITHOUT CONTRAST TECHNIQUE: Multidetector CT imaging of the head and cervical spine was performed following the standard protocol without intravenous contrast. Multiplanar CT image reconstructions of the cervical spine were also generated. COMPARISON:  Head CT dated 11/29/2015 FINDINGS: Evaluation of this exam is limited due to motion artifact. CT HEAD FINDINGS Brain: There is mild moderate age-related atrophy and chronic microvascular ischemic changes. No acute intracranial hemorrhage. No mass effect or midline shift. No extra-axial  fluid collection. Vascular: No hyperdense vessel or unexpected calcification. Skull: Normal. Negative for fracture or focal lesion. Sinuses/Orbits: No acute finding. Other: None CT CERVICAL SPINE FINDINGS Alignment: No acute subluxation. Skull base and vertebrae: No acute fracture. No primary bone lesion or focal pathologic process. Soft tissues and spinal canal: No prevertebral fluid or swelling. No visible canal hematoma. Disc levels: There is degenerative changes and endplate irregularity primarily at C5-C6 and C6-C7. Multilevel facet hypertrophy most prominent at C3-C4 on the left with facet osteophyte complex and associated neural foramina narrowing. Upper chest: Negative. Other: None IMPRESSION: No acute intracranial hemorrhage. No acute/ traumatic cervical spine pathology. Electronically Signed   By: Anner Crete M.D.   On: 02/13/2016 22:08   Dg Femur Min 2 Views Left Result Date: 02/13/2016 CLINICAL  DATA:  History of fall, tender at the hips when palpated EXAM: LEFT FEMUR 2 VIEWS COMPARISON:  09/17/2015 FINDINGS: Patient is status post intra medullary rod and screw fixation of the left femur. Questionable lucency at the femoral neck superiorly. No dislocation evident. 4.3 cm calcification or ossific fragment superior to the trochanter, not clearly identified on prior exam. Distal hardware appears intact. Vascular calcifications are present within the thigh. There are degenerative changes at the left knee. IMPRESSION: 1. Status post intra medullary rod and screw fixation of the left femur. No interim postoperative films since 09/17/2015 are available. 2. Questionable lucency at the superior aspect of femoral neck and superior surface of trochanter. In the absence of interval comparison studies, difficult to know if this represents continued healing of previous fracture versus new fracture. CT may be obtained for further evaluation. Electronically Signed   By: Donavan Foil M.D.   On: 02/13/2016 21:33    Dg Femur, Min 2 Views Right Result Date: 02/13/2016 CLINICAL DATA:  Pain after fall from wheelchair EXAM: RIGHT FEMUR 2 VIEWS COMPARISON:  Fluoroscopic views of the right femur from 06/25/2015, preoperative radiographs from 06/24/2015. No FINDINGS: There is a new subtle impaction along the medial base of the right femoral neck since the 06/25/2015 fluoroscopic images. Small avulsed bony fragment likely off the lesser trochanter seen as well. Patient had a varus angulated basicervical fracture on 06/24/2015 which was fixed with a right-sided intra medullary femoral nail. There is a subtle lucency involving the right inferior pubic ramus as well without displacement. IMPRESSION: There appears to been some settling of the basicervical femoral neck fracture along its medial aspect since fixing with an intramedullary nail, resulting in slight varus angulation since the intraoperative study. This is not believed to represent an acute abnormality. There is however subtle cortical irregularity involving the right inferior pubic ramus near the ischium suspicious for nondisplaced fracture. Electronically Signed   By: Ashley Royalty M.D.   On: 02/13/2016 21:48    2215:  Follow up CT's pending (TS and left femur). Dispo based on results. Sign out to Dr. Sherry Ruffing.    Final Clinical Impressions(s) / ED Diagnoses   Final diagnoses:  Fall    New Prescriptions New Prescriptions   No medications on file      Francine Graven, DO 02/13/16 2226

## 2016-02-13 NOTE — ED Notes (Signed)
Pt returned from CT via stretcher.

## 2016-02-13 NOTE — ED Triage Notes (Signed)
Patient from [facility]. States patient fell from wheelchair PTA. Patient denies pain or inury. Denies LOC. Denies neck or back pain. EMS statse patient Is at his baseline.

## 2016-02-14 DIAGNOSIS — S72002D Fracture of unspecified part of neck of left femur, subsequent encounter for closed fracture with routine healing: Secondary | ICD-10-CM | POA: Diagnosis not present

## 2016-02-14 DIAGNOSIS — Z7401 Bed confinement status: Secondary | ICD-10-CM | POA: Diagnosis not present

## 2016-02-14 DIAGNOSIS — R1312 Dysphagia, oropharyngeal phase: Secondary | ICD-10-CM | POA: Diagnosis not present

## 2016-02-14 DIAGNOSIS — A419 Sepsis, unspecified organism: Secondary | ICD-10-CM | POA: Diagnosis not present

## 2016-02-14 DIAGNOSIS — R279 Unspecified lack of coordination: Secondary | ICD-10-CM | POA: Diagnosis not present

## 2016-02-14 DIAGNOSIS — R4701 Aphasia: Secondary | ICD-10-CM | POA: Diagnosis not present

## 2016-02-14 NOTE — ED Notes (Signed)
MD at bedside.Dr Sherry Ruffing

## 2016-02-14 NOTE — ED Provider Notes (Signed)
Care transferred from Dr. Thurnell Garbe.   At time of signout, patient is awaiting digastric imaging studies to look for new injuries from fall from wheelchair. Patient denied any preceding symptoms and is at his normal mental status per family.  Plan of care was to not obtain laboratory testing due to mechanical fall and no other symptoms.  1:04 AM Diagnostic imaging study showed chronic T11 compression fracture with normal alignment. Also chronic pelvic fracture status post repair. No evidence of new injuries seen on imaging.  Family was informed of these findings and they agree with plan of discharge back to facility given negative imaging. They report the patient is at his normal mental status and has not had any other signs of occult infection or other abnormal. Patient able to sit up without difficulty, do not suspect any new fracture in his back. He reports the patient used to wear a back brace "all the time" and may have had a past injury that they did not know about.  Family and other questions or concerns and patient was discharged in good condition.   Clinical Impression: 1. Fall at nursing home, initial encounter   2. Fall     Disposition: Discharge  Condition: Good  I have discussed the results, Dx and Tx plan with the pt(& family if present). He/she/they expressed understanding and agree(s) with the plan. Discharge instructions discussed at great length. Strict return precautions discussed and pt &/or family have verbalized understanding of the instructions. No further questions at time of discharge.    Discharge Medication List as of 02/14/2016  1:06 AM      Follow Up: Renata Caprice, DO [place] [address] [phone]        Courtney Paris, MD 02/14/16 [phone]

## 2016-02-15 DIAGNOSIS — R4701 Aphasia: Secondary | ICD-10-CM | POA: Diagnosis not present

## 2016-02-15 DIAGNOSIS — A419 Sepsis, unspecified organism: Secondary | ICD-10-CM | POA: Diagnosis not present

## 2016-02-15 DIAGNOSIS — S72002D Fracture of unspecified part of neck of left femur, subsequent encounter for closed fracture with routine healing: Secondary | ICD-10-CM | POA: Diagnosis not present

## 2016-02-15 DIAGNOSIS — R1312 Dysphagia, oropharyngeal phase: Secondary | ICD-10-CM | POA: Diagnosis not present

## 2016-02-16 DIAGNOSIS — S72002D Fracture of unspecified part of neck of left femur, subsequent encounter for closed fracture with routine healing: Secondary | ICD-10-CM | POA: Diagnosis not present

## 2016-02-16 DIAGNOSIS — R4701 Aphasia: Secondary | ICD-10-CM | POA: Diagnosis not present

## 2016-02-16 DIAGNOSIS — R1312 Dysphagia, oropharyngeal phase: Secondary | ICD-10-CM | POA: Diagnosis not present

## 2016-02-16 DIAGNOSIS — A419 Sepsis, unspecified organism: Secondary | ICD-10-CM | POA: Diagnosis not present

## 2016-02-17 DIAGNOSIS — S72002D Fracture of unspecified part of neck of left femur, subsequent encounter for closed fracture with routine healing: Secondary | ICD-10-CM | POA: Diagnosis not present

## 2016-02-17 DIAGNOSIS — R1312 Dysphagia, oropharyngeal phase: Secondary | ICD-10-CM | POA: Diagnosis not present

## 2016-02-17 DIAGNOSIS — A419 Sepsis, unspecified organism: Secondary | ICD-10-CM | POA: Diagnosis not present

## 2016-02-17 DIAGNOSIS — R4701 Aphasia: Secondary | ICD-10-CM | POA: Diagnosis not present

## 2016-02-20 DIAGNOSIS — R4701 Aphasia: Secondary | ICD-10-CM | POA: Diagnosis not present

## 2016-02-20 DIAGNOSIS — S72002D Fracture of unspecified part of neck of left femur, subsequent encounter for closed fracture with routine healing: Secondary | ICD-10-CM | POA: Diagnosis not present

## 2016-02-20 DIAGNOSIS — A419 Sepsis, unspecified organism: Secondary | ICD-10-CM | POA: Diagnosis not present

## 2016-02-20 DIAGNOSIS — R1312 Dysphagia, oropharyngeal phase: Secondary | ICD-10-CM | POA: Diagnosis not present

## 2016-02-21 DIAGNOSIS — R4701 Aphasia: Secondary | ICD-10-CM | POA: Diagnosis not present

## 2016-02-21 DIAGNOSIS — S72002D Fracture of unspecified part of neck of left femur, subsequent encounter for closed fracture with routine healing: Secondary | ICD-10-CM | POA: Diagnosis not present

## 2016-02-21 DIAGNOSIS — R1312 Dysphagia, oropharyngeal phase: Secondary | ICD-10-CM | POA: Diagnosis not present

## 2016-02-21 DIAGNOSIS — A419 Sepsis, unspecified organism: Secondary | ICD-10-CM | POA: Diagnosis not present

## 2016-02-22 DIAGNOSIS — R4701 Aphasia: Secondary | ICD-10-CM | POA: Diagnosis not present

## 2016-02-22 DIAGNOSIS — S72002D Fracture of unspecified part of neck of left femur, subsequent encounter for closed fracture with routine healing: Secondary | ICD-10-CM | POA: Diagnosis not present

## 2016-02-22 DIAGNOSIS — R1312 Dysphagia, oropharyngeal phase: Secondary | ICD-10-CM | POA: Diagnosis not present

## 2016-02-22 DIAGNOSIS — A419 Sepsis, unspecified organism: Secondary | ICD-10-CM | POA: Diagnosis not present

## 2016-02-23 DIAGNOSIS — S72002D Fracture of unspecified part of neck of left femur, subsequent encounter for closed fracture with routine healing: Secondary | ICD-10-CM | POA: Diagnosis not present

## 2016-02-23 DIAGNOSIS — R4701 Aphasia: Secondary | ICD-10-CM | POA: Diagnosis not present

## 2016-02-23 DIAGNOSIS — A419 Sepsis, unspecified organism: Secondary | ICD-10-CM | POA: Diagnosis not present

## 2016-02-23 DIAGNOSIS — R1312 Dysphagia, oropharyngeal phase: Secondary | ICD-10-CM | POA: Diagnosis not present

## 2016-02-25 DIAGNOSIS — S72002D Fracture of unspecified part of neck of left femur, subsequent encounter for closed fracture with routine healing: Secondary | ICD-10-CM | POA: Diagnosis not present

## 2016-02-25 DIAGNOSIS — A419 Sepsis, unspecified organism: Secondary | ICD-10-CM | POA: Diagnosis not present

## 2016-02-25 DIAGNOSIS — R4701 Aphasia: Secondary | ICD-10-CM | POA: Diagnosis not present

## 2016-02-25 DIAGNOSIS — R1312 Dysphagia, oropharyngeal phase: Secondary | ICD-10-CM | POA: Diagnosis not present

## 2016-02-26 DIAGNOSIS — R1312 Dysphagia, oropharyngeal phase: Secondary | ICD-10-CM | POA: Diagnosis not present

## 2016-02-26 DIAGNOSIS — S72002D Fracture of unspecified part of neck of left femur, subsequent encounter for closed fracture with routine healing: Secondary | ICD-10-CM | POA: Diagnosis not present

## 2016-02-26 DIAGNOSIS — A419 Sepsis, unspecified organism: Secondary | ICD-10-CM | POA: Diagnosis not present

## 2016-02-26 DIAGNOSIS — R4701 Aphasia: Secondary | ICD-10-CM | POA: Diagnosis not present

## 2016-02-27 DIAGNOSIS — A419 Sepsis, unspecified organism: Secondary | ICD-10-CM | POA: Diagnosis not present

## 2016-02-27 DIAGNOSIS — R1312 Dysphagia, oropharyngeal phase: Secondary | ICD-10-CM | POA: Diagnosis not present

## 2016-02-27 DIAGNOSIS — S72002D Fracture of unspecified part of neck of left femur, subsequent encounter for closed fracture with routine healing: Secondary | ICD-10-CM | POA: Diagnosis not present

## 2016-02-27 DIAGNOSIS — R4701 Aphasia: Secondary | ICD-10-CM | POA: Diagnosis not present

## 2016-02-28 DIAGNOSIS — R4701 Aphasia: Secondary | ICD-10-CM | POA: Diagnosis not present

## 2016-02-28 DIAGNOSIS — R1312 Dysphagia, oropharyngeal phase: Secondary | ICD-10-CM | POA: Diagnosis not present

## 2016-02-28 DIAGNOSIS — A419 Sepsis, unspecified organism: Secondary | ICD-10-CM | POA: Diagnosis not present

## 2016-02-28 DIAGNOSIS — S72002D Fracture of unspecified part of neck of left femur, subsequent encounter for closed fracture with routine healing: Secondary | ICD-10-CM | POA: Diagnosis not present

## 2016-02-29 DIAGNOSIS — R4701 Aphasia: Secondary | ICD-10-CM | POA: Diagnosis not present

## 2016-02-29 DIAGNOSIS — S72002D Fracture of unspecified part of neck of left femur, subsequent encounter for closed fracture with routine healing: Secondary | ICD-10-CM | POA: Diagnosis not present

## 2016-02-29 DIAGNOSIS — A419 Sepsis, unspecified organism: Secondary | ICD-10-CM | POA: Diagnosis not present

## 2016-02-29 DIAGNOSIS — R1312 Dysphagia, oropharyngeal phase: Secondary | ICD-10-CM | POA: Diagnosis not present

## 2016-03-03 DIAGNOSIS — R1312 Dysphagia, oropharyngeal phase: Secondary | ICD-10-CM | POA: Diagnosis not present

## 2016-03-03 DIAGNOSIS — S72002D Fracture of unspecified part of neck of left femur, subsequent encounter for closed fracture with routine healing: Secondary | ICD-10-CM | POA: Diagnosis not present

## 2016-03-03 DIAGNOSIS — A498 Other bacterial infections of unspecified site: Secondary | ICD-10-CM | POA: Diagnosis not present

## 2016-03-03 DIAGNOSIS — R4701 Aphasia: Secondary | ICD-10-CM | POA: Diagnosis not present

## 2016-03-03 DIAGNOSIS — A419 Sepsis, unspecified organism: Secondary | ICD-10-CM | POA: Diagnosis not present

## 2016-03-04 DIAGNOSIS — R1312 Dysphagia, oropharyngeal phase: Secondary | ICD-10-CM | POA: Diagnosis not present

## 2016-03-04 DIAGNOSIS — A419 Sepsis, unspecified organism: Secondary | ICD-10-CM | POA: Diagnosis not present

## 2016-03-04 DIAGNOSIS — R4701 Aphasia: Secondary | ICD-10-CM | POA: Diagnosis not present

## 2016-03-04 DIAGNOSIS — S72002D Fracture of unspecified part of neck of left femur, subsequent encounter for closed fracture with routine healing: Secondary | ICD-10-CM | POA: Diagnosis not present

## 2016-03-05 DIAGNOSIS — N390 Urinary tract infection, site not specified: Secondary | ICD-10-CM | POA: Diagnosis not present

## 2016-03-05 DIAGNOSIS — G4700 Insomnia, unspecified: Secondary | ICD-10-CM | POA: Diagnosis not present

## 2016-03-05 DIAGNOSIS — R1312 Dysphagia, oropharyngeal phase: Secondary | ICD-10-CM | POA: Diagnosis not present

## 2016-03-07 DIAGNOSIS — R1312 Dysphagia, oropharyngeal phase: Secondary | ICD-10-CM | POA: Diagnosis not present

## 2016-03-07 DIAGNOSIS — R4701 Aphasia: Secondary | ICD-10-CM | POA: Diagnosis not present

## 2016-03-07 DIAGNOSIS — S72002D Fracture of unspecified part of neck of left femur, subsequent encounter for closed fracture with routine healing: Secondary | ICD-10-CM | POA: Diagnosis not present

## 2016-03-07 DIAGNOSIS — A419 Sepsis, unspecified organism: Secondary | ICD-10-CM | POA: Diagnosis not present

## 2016-03-08 DIAGNOSIS — S72002D Fracture of unspecified part of neck of left femur, subsequent encounter for closed fracture with routine healing: Secondary | ICD-10-CM | POA: Diagnosis not present

## 2016-03-08 DIAGNOSIS — R4701 Aphasia: Secondary | ICD-10-CM | POA: Diagnosis not present

## 2016-03-08 DIAGNOSIS — A419 Sepsis, unspecified organism: Secondary | ICD-10-CM | POA: Diagnosis not present

## 2016-03-08 DIAGNOSIS — R1312 Dysphagia, oropharyngeal phase: Secondary | ICD-10-CM | POA: Diagnosis not present

## 2016-03-09 DIAGNOSIS — S72002D Fracture of unspecified part of neck of left femur, subsequent encounter for closed fracture with routine healing: Secondary | ICD-10-CM | POA: Diagnosis not present

## 2016-03-09 DIAGNOSIS — R1312 Dysphagia, oropharyngeal phase: Secondary | ICD-10-CM | POA: Diagnosis not present

## 2016-03-09 DIAGNOSIS — R4701 Aphasia: Secondary | ICD-10-CM | POA: Diagnosis not present

## 2016-03-09 DIAGNOSIS — A419 Sepsis, unspecified organism: Secondary | ICD-10-CM | POA: Diagnosis not present

## 2016-03-10 DIAGNOSIS — A419 Sepsis, unspecified organism: Secondary | ICD-10-CM | POA: Diagnosis not present

## 2016-03-10 DIAGNOSIS — S72002D Fracture of unspecified part of neck of left femur, subsequent encounter for closed fracture with routine healing: Secondary | ICD-10-CM | POA: Diagnosis not present

## 2016-03-10 DIAGNOSIS — R1312 Dysphagia, oropharyngeal phase: Secondary | ICD-10-CM | POA: Diagnosis not present

## 2016-03-10 DIAGNOSIS — R4701 Aphasia: Secondary | ICD-10-CM | POA: Diagnosis not present

## 2016-03-11 DIAGNOSIS — R1312 Dysphagia, oropharyngeal phase: Secondary | ICD-10-CM | POA: Diagnosis not present

## 2016-03-11 DIAGNOSIS — A419 Sepsis, unspecified organism: Secondary | ICD-10-CM | POA: Diagnosis not present

## 2016-03-11 DIAGNOSIS — R4701 Aphasia: Secondary | ICD-10-CM | POA: Diagnosis not present

## 2016-03-11 DIAGNOSIS — S72002D Fracture of unspecified part of neck of left femur, subsequent encounter for closed fracture with routine healing: Secondary | ICD-10-CM | POA: Diagnosis not present

## 2016-03-12 DIAGNOSIS — R1312 Dysphagia, oropharyngeal phase: Secondary | ICD-10-CM | POA: Diagnosis not present

## 2016-03-12 DIAGNOSIS — A419 Sepsis, unspecified organism: Secondary | ICD-10-CM | POA: Diagnosis not present

## 2016-03-12 DIAGNOSIS — R4701 Aphasia: Secondary | ICD-10-CM | POA: Diagnosis not present

## 2016-03-12 DIAGNOSIS — S72002D Fracture of unspecified part of neck of left femur, subsequent encounter for closed fracture with routine healing: Secondary | ICD-10-CM | POA: Diagnosis not present

## 2016-03-14 DIAGNOSIS — S72002D Fracture of unspecified part of neck of left femur, subsequent encounter for closed fracture with routine healing: Secondary | ICD-10-CM | POA: Diagnosis not present

## 2016-03-14 DIAGNOSIS — A419 Sepsis, unspecified organism: Secondary | ICD-10-CM | POA: Diagnosis not present

## 2016-03-14 DIAGNOSIS — R1312 Dysphagia, oropharyngeal phase: Secondary | ICD-10-CM | POA: Diagnosis not present

## 2016-03-14 DIAGNOSIS — R4701 Aphasia: Secondary | ICD-10-CM | POA: Diagnosis not present

## 2016-03-29 DIAGNOSIS — D485 Neoplasm of uncertain behavior of skin: Secondary | ICD-10-CM | POA: Diagnosis not present

## 2016-03-29 DIAGNOSIS — N390 Urinary tract infection, site not specified: Secondary | ICD-10-CM | POA: Diagnosis not present

## 2016-03-29 DIAGNOSIS — L570 Actinic keratosis: Secondary | ICD-10-CM | POA: Diagnosis not present

## 2016-03-29 DIAGNOSIS — Z79899 Other long term (current) drug therapy: Secondary | ICD-10-CM | POA: Diagnosis not present

## 2016-03-29 DIAGNOSIS — R319 Hematuria, unspecified: Secondary | ICD-10-CM | POA: Diagnosis not present

## 2016-04-02 DIAGNOSIS — L989 Disorder of the skin and subcutaneous tissue, unspecified: Secondary | ICD-10-CM | POA: Diagnosis not present

## 2016-04-02 DIAGNOSIS — N183 Chronic kidney disease, stage 3 (moderate): Secondary | ICD-10-CM | POA: Diagnosis not present

## 2016-04-02 DIAGNOSIS — N390 Urinary tract infection, site not specified: Secondary | ICD-10-CM | POA: Diagnosis not present

## 2016-04-05 DIAGNOSIS — N183 Chronic kidney disease, stage 3 (moderate): Secondary | ICD-10-CM | POA: Diagnosis not present

## 2016-04-12 DIAGNOSIS — N390 Urinary tract infection, site not specified: Secondary | ICD-10-CM | POA: Diagnosis not present

## 2016-04-12 DIAGNOSIS — R05 Cough: Secondary | ICD-10-CM | POA: Diagnosis not present

## 2016-04-13 DIAGNOSIS — R319 Hematuria, unspecified: Secondary | ICD-10-CM | POA: Diagnosis not present

## 2016-04-13 DIAGNOSIS — N390 Urinary tract infection, site not specified: Secondary | ICD-10-CM | POA: Diagnosis not present

## 2016-04-14 DIAGNOSIS — Z79899 Other long term (current) drug therapy: Secondary | ICD-10-CM | POA: Diagnosis not present

## 2016-04-14 DIAGNOSIS — I200 Unstable angina: Secondary | ICD-10-CM | POA: Diagnosis not present

## 2016-04-14 DIAGNOSIS — N390 Urinary tract infection, site not specified: Secondary | ICD-10-CM | POA: Diagnosis not present

## 2016-04-14 DIAGNOSIS — A498 Other bacterial infections of unspecified site: Secondary | ICD-10-CM | POA: Diagnosis not present

## 2016-04-14 DIAGNOSIS — R319 Hematuria, unspecified: Secondary | ICD-10-CM | POA: Diagnosis not present

## 2016-04-17 DIAGNOSIS — R1310 Dysphagia, unspecified: Secondary | ICD-10-CM | POA: Diagnosis not present

## 2016-04-17 DIAGNOSIS — N390 Urinary tract infection, site not specified: Secondary | ICD-10-CM | POA: Diagnosis not present

## 2016-04-17 DIAGNOSIS — R05 Cough: Secondary | ICD-10-CM | POA: Diagnosis not present

## 2016-04-24 DIAGNOSIS — J449 Chronic obstructive pulmonary disease, unspecified: Secondary | ICD-10-CM | POA: Diagnosis not present

## 2016-04-24 DIAGNOSIS — R05 Cough: Secondary | ICD-10-CM | POA: Diagnosis not present

## 2016-05-12 ENCOUNTER — Emergency Department (HOSPITAL_COMMUNITY): Payer: Medicare Other

## 2016-05-12 ENCOUNTER — Encounter (HOSPITAL_COMMUNITY): Payer: Self-pay | Admitting: *Deleted

## 2016-05-12 ENCOUNTER — Emergency Department (HOSPITAL_COMMUNITY)
Admission: EM | Admit: 2016-05-12 | Discharge: 2016-05-13 | Disposition: A | Discharge status: Home / Self Care | Payer: Medicare Other | Attending: Emergency Medicine | Admitting: Emergency Medicine

## 2016-05-12 DIAGNOSIS — Y999 Unspecified external cause status: Secondary | ICD-10-CM | POA: Diagnosis not present

## 2016-05-12 DIAGNOSIS — Z87891 Personal history of nicotine dependence: Secondary | ICD-10-CM | POA: Diagnosis not present

## 2016-05-12 DIAGNOSIS — S0993XA Unspecified injury of face, initial encounter: Secondary | ICD-10-CM | POA: Diagnosis not present

## 2016-05-12 DIAGNOSIS — S022XXB Fracture of nasal bones, initial encounter for open fracture: Secondary | ICD-10-CM | POA: Diagnosis not present

## 2016-05-12 DIAGNOSIS — Y929 Unspecified place or not applicable: Secondary | ICD-10-CM | POA: Insufficient documentation

## 2016-05-12 DIAGNOSIS — W050XXA Fall from non-moving wheelchair, initial encounter: Secondary | ICD-10-CM | POA: Insufficient documentation

## 2016-05-12 DIAGNOSIS — Y939 Activity, unspecified: Secondary | ICD-10-CM | POA: Insufficient documentation

## 2016-05-12 DIAGNOSIS — I129 Hypertensive chronic kidney disease with stage 1 through stage 4 chronic kidney disease, or unspecified chronic kidney disease: Secondary | ICD-10-CM | POA: Insufficient documentation

## 2016-05-12 DIAGNOSIS — Z79899 Other long term (current) drug therapy: Secondary | ICD-10-CM | POA: Diagnosis not present

## 2016-05-12 DIAGNOSIS — N183 Chronic kidney disease, stage 3 (moderate): Secondary | ICD-10-CM | POA: Insufficient documentation

## 2016-05-12 DIAGNOSIS — Z7982 Long term (current) use of aspirin: Secondary | ICD-10-CM | POA: Diagnosis not present

## 2016-05-12 DIAGNOSIS — R040 Epistaxis: Secondary | ICD-10-CM | POA: Diagnosis not present

## 2016-05-12 DIAGNOSIS — S022XXA Fracture of nasal bones, initial encounter for closed fracture: Secondary | ICD-10-CM | POA: Diagnosis not present

## 2016-05-12 DIAGNOSIS — W19XXXA Unspecified fall, initial encounter: Secondary | ICD-10-CM

## 2016-05-12 DIAGNOSIS — S6992XA Unspecified injury of left wrist, hand and finger(s), initial encounter: Secondary | ICD-10-CM | POA: Diagnosis not present

## 2016-05-12 NOTE — ED Notes (Signed)
Pt to xray

## 2016-05-12 NOTE — ED Provider Notes (Signed)
[facility] DEPT Provider Note   CSN: [id number] Arrival date & time: 05/12/16  2119  By signing my name below, I, Oleh Genin, attest that this documentation has been prepared under the direction and in the presence of Nat Christen, MD. Electronically Signed: Oleh Genin, Scribe. 05/12/16. 10:12 PM.   History   Chief Complaint Chief Complaint  Patient presents with  . Fall   LEVEL 5 CAVEAT DUE TO DEMENTIA  HPI David Collier is a 83 y.o. male with history of Alzheimer's dementia, CAD with stenting in 2010, CKD III, HTN, and HLD presents to the ED following a witnessed fall at his skilled nursing facility. According to the patient's daughter who is at interview, this patient is wheelchair bound at baseline following a prior bilateral hip fracture. Today, he "attempted to get out of his wheelchair" in a state of confusion and fell forward to the floor. Struck his face against the floor. No LOC. The patient was transported to this facility by EMS. At interview, the patient is reportedly at his baseline mental status according to the daughter.   A complete history is limited by dementia.   HPI  Past Medical History:  Diagnosis Date  . Alzheimer's dementia   . Anginal pain (HCC)    NONE IN 1 MONTH   . 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 CAD (coronary artery disease), native coronary artery    Initially diagnosed 2003 2006 catheterization with bare metal stenting of the first marginal branch of the circumflex and continuation branch of the circumflex 2010 catheterization showing normal left main, 50-60% LAD unchanged from before, proximal stent of marginal patent, severe in-stent restenosis treated with cutting balloon angioplasty, patent stent in continuation branch with mild in-stent restenosis, occluded nondominant right coronary artery.   . CKD (chronic kidney disease), stage III   . Coronary atherosclerosis of native coronary artery    BMS circumflex  and OM 2006, PTCA ISR 2010, moderate LAD disease  . Depression   . DJD (degenerative joint disease)   . Headache   . History of stroke    Seen radiographically-left PICA; unknown to patient.  . Hyperlipidemia   . Hypertension   . Macular degeneration   . Myocardial infarction    x5    1980s  sees DR. MICHAEL COOPER  . Seizures ([place])   . Stroke ([place])   . TIA (transient ischemic attack) 03/05/2014    Patient Active Problem List   Diagnosis Date Noted  . Bacteremia 10/08/2015  . Sepsis ([place]) 10/06/2015  . UTI (lower urinary tract infection) 10/06/2015  . Acute renal failure superimposed on stage 3 chronic kidney disease ([place]) 10/06/2015  . Acute blood loss anemia 09/18/2015  . Closed left hip fracture ([place]) 09/17/2015  . Trochanteric fracture of right femur ([place]) 06/24/2015  . Community acquired pneumonia 06/24/2015  . Hip fracture ([place]) 06/24/2015  . Obesity (BMI 30-39.9) 07/11/2014  . Dementia 07/11/2014  . Unstable angina pectoris ([place]) 07/11/2014  . 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 CKD (chronic kidney disease), stage III 03/05/2014  . Macular degeneration (senile) of retina 02/16/2009  . Essential hypertension 02/16/2009  . Hyperlipidemia   . CAD (coronary artery disease), native coronary artery     Past Surgical History:  Procedure Laterality Date  . APPENDECTOMY    . CARDIAC CATHETERIZATION     3 STENTS  AT  [place]  80S OR 90S  .  CHOLECYSTECTOMY    . CORONARY ANGIOPLASTY    . FEMUR IM NAIL Left 09/17/2015   Procedure: INTRAMEDULLARY (IM) NAIL FEMORAL;  Surgeon: Dorna Leitz, MD;  Location: [facility];  Service: Orthopedics;  Laterality: Left;  . INTRAMEDULLARY (IM) NAIL INTERTROCHANTERIC Right 06/25/2015   Procedure: INTRAMEDULLARY (IM) NAIL INTERTROCHANTRIC;  Surgeon: Dorna Leitz, MD;  Location: [facility];  Service: Orthopedics;   Laterality: Right;  . MULTIPLE EXTRACTIONS WITH ALVEOLOPLASTY N/A 10/22/2014   Procedure: MULTIPLE EXTRACTIONS  # 4,6, 7, 8, 10, 21, 22, 23, 24, 25, 26, 27, 28 and ALVEOLOPLASTY;  Surgeon: Diona Browner, DDS;  Location: [facility];  Service: Oral Surgery;  Laterality: N/A;       Home Medications    Prior to Admission medications   Medication Sig Start Date End Date Taking? Authorizing Provider  acetaminophen (TYLENOL) 325 MG tablet Take 650 mg by mouth every 6 (six) hours as needed for mild pain or fever.   Yes Historical Provider, MD  albuterol (PROVENTIL) (2.5 MG/3ML) 0.083% nebulizer solution Take 2.5 mg by nebulization every 6 (six) hours as needed for wheezing or shortness of breath.   Yes Historical Provider, MD  aspirin EC 325 MG EC tablet Take 1 tablet (325 mg total) by mouth daily with breakfast. 09/20/15  Yes Velvet Bathe, MD  cyanocobalamin 500 MCG tablet Take 500 mcg by mouth daily.   Yes Historical Provider, MD  divalproex (DEPAKOTE) 250 MG DR tablet Take 250 mg by mouth 2 (two) times daily.   Yes Historical Provider, MD  ferrous sulfate 325 (65 FE) MG tablet Take 325 mg by mouth daily with breakfast.   Yes Historical Provider, MD  fluticasone (FLONASE) 50 MCG/ACT nasal spray Place 1 spray into both nostrils daily.   Yes Historical Provider, MD  haloperidol (HALDOL) 0.5 MG tablet Take 0.5 mg by mouth daily.   Yes Historical Provider, MD  HYDROcodone-acetaminophen (NORCO) 5-325 MG tablet Take 1 tablet by mouth every 6 (six) hours as needed for moderate pain. 09/17/15  Yes Gary Fleet, PA-C  Melatonin 3 MG CAPS Take 3 mg by mouth at bedtime.   Yes Historical Provider, MD  midodrine (PROAMATINE) 2.5 MG tablet Take 2.5 mg by mouth 3 (three) times daily with meals.   Yes Historical Provider, MD  NITROSTAT 0.4 MG SL tablet DISSOLVE ONE TABLET UNDER THE TONGUE EVERY 5 MINUTES AS NEEDED FOR CHEST PAIN 04/12/14  Yes [name], MD  pantoprazole (PROTONIX) 20 MG tablet Take 1 tablet (20 mg  total) by mouth daily. 09/06/15  Yes Daleen Bo, MD  senna (SENOKOT) 8.6 MG TABS tablet Take 1 tablet by mouth at bedtime.   Yes Historical Provider, MD  sertraline (ZOLOFT) 100 MG tablet Take 100 mg by mouth daily.   Yes Historical Provider, MD  traZODone (DESYREL) 50 MG tablet Take 50 mg by mouth at bedtime.    Yes Historical Provider, MD  divalproex (DEPAKOTE SPRINKLE) 125 MG capsule Take 6 capsules (750 mg total) by mouth at bedtime. Patient not taking: Reported on 02/13/2016 10/10/15   Erline Hau, MD    Family History Family History  Problem Relation Age of Onset  . Heart failure Father   . Heart attack Father   . Heart failure Mother   . CAD Brother   . CAD Brother   . Cancer Brother   . Cancer Brother   . Hypertension Sister   . Heart attack Sister   . Heart failure      Social History Social  History  Substance Use Topics  . Smoking status: Former Smoker    Types: Cigarettes    Quit date: 04/23/1966  . Smokeless tobacco: Never Used  . Alcohol use No     Allergies   Aricept [donepezil hcl]; Ativan [lorazepam]; Atorvastatin; Namenda [memantine]; Other; Simvastatin; and Sorbitan   Review of Systems Review of Systems  Unable to perform ROS: Dementia   Physical Exam Updated Vital Signs BP (!) 133/114 (BP Location: Left Arm)   Pulse (!) 56   Temp 97.1 F (36.2 C) (Axillary)   Resp 15   Ht 6' (1.829 m)   Wt 160 lb (72.6 kg)   SpO2 (!) 88%   BMI 21.70 kg/m   Physical Exam  Constitutional: He appears well-developed and well-nourished.  HENT:  Head: Normocephalic.  There is a 1cm laceration to the nasal bridge which is currently hemostatic.   Eyes: Conjunctivae are normal.  Neck: Neck supple.  Cardiovascular: Normal rate and regular rhythm.   Pulmonary/Chest: Effort normal and breath sounds normal.  Abdominal: Soft. Bowel sounds are normal.  Musculoskeletal: Normal range of motion.  Neurological: He is alert.  Patient is confused. A  complete neurological exam is limited by dementia.   Skin: Skin is warm and dry.  Psychiatric: He has a normal mood and affect. His behavior is normal.  Nursing note and vitals reviewed.    ED Treatments / Results  DIAGNOSTIC STUDIES: Oxygen Saturation is 88 percent on room air which is low by my interpretation.    COORDINATION OF CARE: 10:11 PM Discussed treatment plan with pt at bedside and pt agreed to plan.   Labs (all labs ordered are listed, but only abnormal results are displayed) Labs Reviewed - No data to display  EKG  EKG Interpretation None       Radiology Ct Head Wo Contrast  Result Date: 05/12/2016 CLINICAL DATA:  Fall from wheelchair striking face. Head and face pain. EXAM: CT HEAD WITHOUT CONTRAST CT MAXILLOFACIAL WITHOUT CONTRAST TECHNIQUE: Multidetector CT imaging of the head and maxillofacial structures were performed using the standard protocol without intravenous contrast. Multiplanar CT image reconstructions of the maxillofacial structures were also generated. COMPARISON:  Head CT 02/13/2016. FINDINGS: CT HEAD FINDINGS Brain: No evidence of acute infarction, hemorrhage, hydrocephalus, extra-axial collection or mass lesion/mass effect. Stable atrophy and chronic small vessel ischemia. Vascular: Atherosclerosis of skullbase vasculature without hyperdense vessel or abnormal calcification. Skull: Normal. Negative for fracture or focal lesion. Other: None. CT MAXILLOFACIAL FINDINGS Osseous: Depressed nasal bone fracture with associated soft tissue edema. No additional acute facial bone fracture. Zygomatic arches and mandibles are intact. Patient is edentulous. Orbits: Post bilateral cataract resection. No traumatic or inflammatory finding. Sinuses: Maxillary sinus mucosal thickening.  No fluid levels. Soft tissues: Edema about the nasal bridge. IMPRESSION: 1. No acute intracranial abnormality. Able atrophy and chronic small vessel ischemia. 2. Acute depressed nasal bone  fracture. Electronically Signed   By: Jeb Levering M.D.   On: 05/12/2016 23:25   Ct Maxillofacial Wo Cm  Result Date: 05/12/2016 CLINICAL DATA:  Fall from wheelchair striking face. Head and face pain. EXAM: CT HEAD WITHOUT CONTRAST CT MAXILLOFACIAL WITHOUT CONTRAST TECHNIQUE: Multidetector CT imaging of the head and maxillofacial structures were performed using the standard protocol without intravenous contrast. Multiplanar CT image reconstructions of the maxillofacial structures were also generated. COMPARISON:  Head CT 02/13/2016. FINDINGS: CT HEAD FINDINGS Brain: No evidence of acute infarction, hemorrhage, hydrocephalus, extra-axial collection or mass lesion/mass effect. Stable atrophy and chronic small vessel  ischemia. Vascular: Atherosclerosis of skullbase vasculature without hyperdense vessel or abnormal calcification. Skull: Normal. Negative for fracture or focal lesion. Other: None. CT MAXILLOFACIAL FINDINGS Osseous: Depressed nasal bone fracture with associated soft tissue edema. No additional acute facial bone fracture. Zygomatic arches and mandibles are intact. Patient is edentulous. Orbits: Post bilateral cataract resection. No traumatic or inflammatory finding. Sinuses: Maxillary sinus mucosal thickening.  No fluid levels. Soft tissues: Edema about the nasal bridge. IMPRESSION: 1. No acute intracranial abnormality. Able atrophy and chronic small vessel ischemia. 2. Acute depressed nasal bone fracture. Electronically Signed   By: Jeb Levering M.D.   On: 05/12/2016 23:25    Procedures Procedures (including critical care time)  Medications Ordered in ED Medications - No data to display   Initial Impression / Assessment and Plan / ED Course  I have reviewed the triage vital signs and the nursing notes.  Pertinent labs & imaging results that were available during my care of the patient were reviewed by me and considered in my medical decision making (see chart for details).      Daughter reports normal behavior. CT head shows no acute intracranial abnormality.  Maxillofacial CT reveals a depressed nasal bone fracture.  Follow-up with ENT.  Final Clinical Impressions(s) / ED Diagnoses   Final diagnoses:  Fall, initial encounter  Open fracture of nasal bone, initial encounter    New Prescriptions New Prescriptions   No medications on file  I personally performed the services described in this documentation, which was scribed in my presence. The recorded information has been reviewed and is accurate.     Nat Christen, MD 05/12/16 [phone]

## 2016-05-12 NOTE — ED Notes (Signed)
Pts daughter at bedside  

## 2016-05-12 NOTE — Discharge Instructions (Signed)
David Collier has a nasal bone fracture. No intervention is necessary at this time. If this bothers him, recommend following up with an ENT doctor. Phone number given.

## 2016-05-12 NOTE — ED Triage Notes (Signed)
Pt is a resident of [facility] who fell from a wheelchair, hitting his head, nose and right hand on the floor, pt arrived to er confused which is pt's baseline per ems,

## 2016-05-14 DIAGNOSIS — Z743 Need for continuous supervision: Secondary | ICD-10-CM | POA: Diagnosis not present

## 2016-05-14 DIAGNOSIS — R279 Unspecified lack of coordination: Secondary | ICD-10-CM | POA: Diagnosis not present

## 2016-05-24 DIAGNOSIS — G4709 Other insomnia: Secondary | ICD-10-CM | POA: Diagnosis not present

## 2016-06-01 DIAGNOSIS — S72002D Fracture of unspecified part of neck of left femur, subsequent encounter for closed fracture with routine healing: Secondary | ICD-10-CM | POA: Diagnosis not present

## 2016-06-01 DIAGNOSIS — R2681 Unsteadiness on feet: Secondary | ICD-10-CM | POA: Diagnosis not present

## 2016-06-01 DIAGNOSIS — M6281 Muscle weakness (generalized): Secondary | ICD-10-CM | POA: Diagnosis not present

## 2016-06-01 DIAGNOSIS — R1310 Dysphagia, unspecified: Secondary | ICD-10-CM | POA: Diagnosis not present

## 2016-06-04 ENCOUNTER — Emergency Department (HOSPITAL_COMMUNITY): Payer: Medicare Other

## 2016-06-04 ENCOUNTER — Encounter (HOSPITAL_COMMUNITY): Payer: Self-pay | Admitting: Emergency Medicine

## 2016-06-04 ENCOUNTER — Inpatient Hospital Stay (HOSPITAL_COMMUNITY)
Admission: EM | Admit: 2016-06-04 | Discharge: 2016-06-08 | DRG: 689 | Disposition: A | Discharge status: Skilled Nursing Facility | Payer: Medicare Other | Attending: Internal Medicine | Admitting: Internal Medicine

## 2016-06-04 DIAGNOSIS — R1312 Dysphagia, oropharyngeal phase: Secondary | ICD-10-CM

## 2016-06-04 DIAGNOSIS — R05 Cough: Secondary | ICD-10-CM | POA: Diagnosis not present

## 2016-06-04 DIAGNOSIS — Z7951 Long term (current) use of inhaled steroids: Secondary | ICD-10-CM | POA: Diagnosis not present

## 2016-06-04 DIAGNOSIS — Z8249 Family history of ischemic heart disease and other diseases of the circulatory system: Secondary | ICD-10-CM

## 2016-06-04 DIAGNOSIS — Z87891 Personal history of nicotine dependence: Secondary | ICD-10-CM

## 2016-06-04 DIAGNOSIS — S72002D Fracture of unspecified part of neck of left femur, subsequent encounter for closed fracture with routine healing: Secondary | ICD-10-CM | POA: Diagnosis not present

## 2016-06-04 DIAGNOSIS — Z6822 Body mass index (BMI) 22.0-22.9, adult: Secondary | ICD-10-CM | POA: Diagnosis not present

## 2016-06-04 DIAGNOSIS — R400 Somnolence: Secondary | ICD-10-CM

## 2016-06-04 DIAGNOSIS — Z888 Allergy status to other drugs, medicaments and biological substances status: Secondary | ICD-10-CM | POA: Diagnosis not present

## 2016-06-04 DIAGNOSIS — D649 Anemia, unspecified: Secondary | ICD-10-CM | POA: Diagnosis not present

## 2016-06-04 DIAGNOSIS — Z7982 Long term (current) use of aspirin: Secondary | ICD-10-CM

## 2016-06-04 DIAGNOSIS — I252 Old myocardial infarction: Secondary | ICD-10-CM | POA: Diagnosis not present

## 2016-06-04 DIAGNOSIS — N183 Chronic kidney disease, stage 3 unspecified: Secondary | ICD-10-CM | POA: Diagnosis present

## 2016-06-04 DIAGNOSIS — E43 Unspecified severe protein-calorie malnutrition: Secondary | ICD-10-CM | POA: Diagnosis present

## 2016-06-04 DIAGNOSIS — I129 Hypertensive chronic kidney disease with stage 1 through stage 4 chronic kidney disease, or unspecified chronic kidney disease: Secondary | ICD-10-CM | POA: Diagnosis present

## 2016-06-04 DIAGNOSIS — R402411 Glasgow coma scale score 13-15, in the field [EMT or ambulance]: Secondary | ICD-10-CM | POA: Diagnosis not present

## 2016-06-04 DIAGNOSIS — Z79899 Other long term (current) drug therapy: Secondary | ICD-10-CM

## 2016-06-04 DIAGNOSIS — Z1612 Extended spectrum beta lactamase (ESBL) resistance: Secondary | ICD-10-CM | POA: Diagnosis not present

## 2016-06-04 DIAGNOSIS — R531 Weakness: Secondary | ICD-10-CM | POA: Diagnosis not present

## 2016-06-04 DIAGNOSIS — Z955 Presence of coronary angioplasty implant and graft: Secondary | ICD-10-CM

## 2016-06-04 DIAGNOSIS — F0280 Dementia in other diseases classified elsewhere without behavioral disturbance: Secondary | ICD-10-CM | POA: Diagnosis present

## 2016-06-04 DIAGNOSIS — I2510 Atherosclerotic heart disease of native coronary artery without angina pectoris: Secondary | ICD-10-CM | POA: Diagnosis present

## 2016-06-04 DIAGNOSIS — R627 Adult failure to thrive: Secondary | ICD-10-CM | POA: Diagnosis not present

## 2016-06-04 DIAGNOSIS — G309 Alzheimer's disease, unspecified: Secondary | ICD-10-CM | POA: Diagnosis not present

## 2016-06-04 DIAGNOSIS — Z993 Dependence on wheelchair: Secondary | ICD-10-CM

## 2016-06-04 DIAGNOSIS — R059 Cough, unspecified: Secondary | ICD-10-CM

## 2016-06-04 DIAGNOSIS — J189 Pneumonia, unspecified organism: Secondary | ICD-10-CM | POA: Diagnosis not present

## 2016-06-04 DIAGNOSIS — N390 Urinary tract infection, site not specified: Secondary | ICD-10-CM | POA: Diagnosis not present

## 2016-06-04 DIAGNOSIS — R1310 Dysphagia, unspecified: Secondary | ICD-10-CM | POA: Diagnosis not present

## 2016-06-04 DIAGNOSIS — I739 Peripheral vascular disease, unspecified: Secondary | ICD-10-CM | POA: Diagnosis not present

## 2016-06-04 DIAGNOSIS — R404 Transient alteration of awareness: Secondary | ICD-10-CM | POA: Diagnosis not present

## 2016-06-04 DIAGNOSIS — Z8673 Personal history of transient ischemic attack (TIA), and cerebral infarction without residual deficits: Secondary | ICD-10-CM | POA: Diagnosis not present

## 2016-06-04 DIAGNOSIS — F0390 Unspecified dementia without behavioral disturbance: Secondary | ICD-10-CM | POA: Diagnosis not present

## 2016-06-04 DIAGNOSIS — G9340 Encephalopathy, unspecified: Secondary | ICD-10-CM | POA: Diagnosis present

## 2016-06-04 DIAGNOSIS — R319 Hematuria, unspecified: Secondary | ICD-10-CM | POA: Diagnosis not present

## 2016-06-04 DIAGNOSIS — A498 Other bacterial infections of unspecified site: Secondary | ICD-10-CM | POA: Diagnosis not present

## 2016-06-04 DIAGNOSIS — Z515 Encounter for palliative care: Secondary | ICD-10-CM

## 2016-06-04 DIAGNOSIS — R2681 Unsteadiness on feet: Secondary | ICD-10-CM | POA: Diagnosis not present

## 2016-06-04 DIAGNOSIS — M6281 Muscle weakness (generalized): Secondary | ICD-10-CM | POA: Diagnosis not present

## 2016-06-04 DIAGNOSIS — Z66 Do not resuscitate: Secondary | ICD-10-CM | POA: Diagnosis not present

## 2016-06-04 DIAGNOSIS — W050XXA Fall from non-moving wheelchair, initial encounter: Secondary | ICD-10-CM | POA: Diagnosis present

## 2016-06-04 LAB — CBC
HEMATOCRIT: 30.6 % — AB (ref 39.0–52.0)
Hemoglobin: 9.9 g/dL — ABNORMAL LOW (ref 13.0–17.0)
MCH: 30 pg (ref 26.0–34.0)
MCHC: 32.4 g/dL (ref 30.0–36.0)
MCV: 92.7 fL (ref 78.0–100.0)
PLATELETS: 214 10*3/uL (ref 150–400)
RBC: 3.3 MIL/uL — ABNORMAL LOW (ref 4.22–5.81)
RDW: 16.5 % — AB (ref 11.5–15.5)
WBC: 14.6 10*3/uL — ABNORMAL HIGH (ref 4.0–10.5)

## 2016-06-04 LAB — HEPATIC FUNCTION PANEL
ALBUMIN: 2.4 g/dL — AB (ref 3.5–5.0)
ALT: 9 U/L — AB (ref 17–63)
AST: 13 U/L — AB (ref 15–41)
Alkaline Phosphatase: 59 U/L (ref 38–126)
BILIRUBIN INDIRECT: 0.6 mg/dL (ref 0.3–0.9)
Bilirubin, Direct: 0.1 mg/dL (ref 0.1–0.5)
TOTAL PROTEIN: 6.1 g/dL — AB (ref 6.5–8.1)
Total Bilirubin: 0.7 mg/dL (ref 0.3–1.2)

## 2016-06-04 LAB — URINALYSIS, ROUTINE W REFLEX MICROSCOPIC
BILIRUBIN URINE: NEGATIVE
Glucose, UA: NEGATIVE mg/dL
Ketones, ur: NEGATIVE mg/dL
Nitrite: NEGATIVE
PROTEIN: 100 mg/dL — AB
SPECIFIC GRAVITY, URINE: 1.015 (ref 1.005–1.030)
pH: 6 (ref 5.0–8.0)

## 2016-06-04 LAB — I-STAT CG4 LACTIC ACID, ED: LACTIC ACID, VENOUS: 0.72 mmol/L (ref 0.5–1.9)

## 2016-06-04 LAB — VALPROIC ACID LEVEL: VALPROIC ACID LVL: 23 ug/mL — AB (ref 50.0–100.0)

## 2016-06-04 LAB — BASIC METABOLIC PANEL
ANION GAP: 6 (ref 5–15)
BUN: 31 mg/dL — ABNORMAL HIGH (ref 6–20)
CO2: 28 mmol/L (ref 22–32)
Calcium: 8.6 mg/dL — ABNORMAL LOW (ref 8.9–10.3)
Chloride: 103 mmol/L (ref 101–111)
Creatinine, Ser: 1.47 mg/dL — ABNORMAL HIGH (ref 0.61–1.24)
GFR calc non Af Amer: 42 mL/min — ABNORMAL LOW (ref >60)
GFR, EST AFRICAN AMERICAN: 48 mL/min — AB (ref >60)
Glucose, Bld: 89 mg/dL (ref 65–99)
POTASSIUM: 3.8 mmol/L (ref 3.5–5.1)
Sodium: 137 mmol/L (ref 135–145)

## 2016-06-04 LAB — I-STAT TROPONIN, ED: Troponin i, poc: 0 ng/mL (ref 0.00–0.08)

## 2016-06-04 LAB — MRSA PCR SCREENING: MRSA by PCR: NEGATIVE

## 2016-06-04 LAB — POC OCCULT BLOOD, ED: Fecal Occult Bld: NEGATIVE

## 2016-06-04 LAB — LIPASE, BLOOD: Lipase: 14 U/L (ref 11–51)

## 2016-06-04 MED ORDER — SODIUM CHLORIDE 0.9 % IV SOLN
1.0000 g | Freq: Two times a day (BID) | INTRAVENOUS | Status: DC
  Administered 2016-06-04 – 2016-06-07 (×7): 1 g via INTRAVENOUS
  Filled 2016-06-04 (×14): qty 1

## 2016-06-04 MED ORDER — DIVALPROEX SODIUM 125 MG PO CSDR
250.0000 mg | DELAYED_RELEASE_CAPSULE | Freq: Three times a day (TID) | ORAL | Status: DC
  Administered 2016-06-04 – 2016-06-08 (×12): 250 mg via ORAL
  Filled 2016-06-04 (×20): qty 2

## 2016-06-04 MED ORDER — DEXTROSE 5 % IV SOLN
1.0000 g | Freq: Once | INTRAVENOUS | Status: AC
  Administered 2016-06-04: 1 g via INTRAVENOUS
  Filled 2016-06-04: qty 10

## 2016-06-04 MED ORDER — DIVALPROEX SODIUM 125 MG PO CSDR
DELAYED_RELEASE_CAPSULE | ORAL | Status: AC
  Filled 2016-06-04: qty 2

## 2016-06-04 MED ORDER — ONDANSETRON HCL 4 MG/2ML IJ SOLN: 4.0000 mg | Freq: Four times a day (QID) | INTRAMUSCULAR | Status: DC | PRN

## 2016-06-04 MED ORDER — ASPIRIN EC 325 MG PO TBEC
325.0000 mg | DELAYED_RELEASE_TABLET | Freq: Every day | ORAL | Status: DC
  Administered 2016-06-05 – 2016-06-08 (×4): 325 mg via ORAL
  Filled 2016-06-04 (×4): qty 1

## 2016-06-04 MED ORDER — SODIUM CHLORIDE 0.9 % IV SOLN
INTRAVENOUS | Status: AC
  Filled 2016-06-04: qty 1

## 2016-06-04 MED ORDER — HYDROCODONE-ACETAMINOPHEN 5-325 MG PO TABS
1.0000 | ORAL_TABLET | Freq: Four times a day (QID) | ORAL | Status: DC | PRN
  Administered 2016-06-06 – 2016-06-08 (×2): 1 via ORAL
  Filled 2016-06-04 (×2): qty 1

## 2016-06-04 MED ORDER — PANTOPRAZOLE SODIUM 20 MG PO TBEC
20.0000 mg | DELAYED_RELEASE_TABLET | Freq: Every day | ORAL | Status: DC
  Filled 2016-06-04 (×2): qty 1

## 2016-06-04 MED ORDER — SERTRALINE HCL 50 MG PO TABS
100.0000 mg | ORAL_TABLET | Freq: Every day | ORAL | Status: DC
Start: 2016-06-05 — End: 2016-06-08
  Administered 2016-06-05 – 2016-06-08 (×4): 100 mg via ORAL
  Filled 2016-06-04 (×4): qty 2

## 2016-06-04 MED ORDER — MIDODRINE HCL 5 MG PO TABS
2.5000 mg | ORAL_TABLET | Freq: Three times a day (TID) | ORAL | Status: DC
  Administered 2016-06-05 – 2016-06-08 (×9): 2.5 mg via ORAL
  Filled 2016-06-04 (×10): qty 1

## 2016-06-04 MED ORDER — HALOPERIDOL 0.5 MG PO TABS
0.5000 mg | ORAL_TABLET | Freq: Every day | ORAL | Status: DC
  Administered 2016-06-05 – 2016-06-08 (×5): 0.5 mg via ORAL
  Filled 2016-06-04 (×6): qty 1

## 2016-06-04 MED ORDER — ACETAMINOPHEN 650 MG RE SUPP: 650.0000 mg | Freq: Four times a day (QID) | RECTAL | Status: DC | PRN

## 2016-06-04 MED ORDER — ACETAMINOPHEN 325 MG PO TABS: 650.0000 mg | ORAL_TABLET | Freq: Four times a day (QID) | ORAL | Status: DC | PRN

## 2016-06-04 MED ORDER — ENOXAPARIN SODIUM 40 MG/0.4ML ~~LOC~~ SOLN
40.0000 mg | SUBCUTANEOUS | Status: DC
  Administered 2016-06-04 – 2016-06-07 (×4): 40 mg via SUBCUTANEOUS
  Filled 2016-06-04 (×4): qty 0.4

## 2016-06-04 MED ORDER — FLUTICASONE PROPIONATE 50 MCG/ACT NA SUSP
1.0000 | Freq: Every day | NASAL | Status: DC
  Administered 2016-06-05 – 2016-06-08 (×3): 1 via NASAL
  Filled 2016-06-04: qty 16

## 2016-06-04 MED ORDER — ONDANSETRON HCL 4 MG PO TABS: 4.0000 mg | ORAL_TABLET | Freq: Four times a day (QID) | ORAL | Status: DC | PRN

## 2016-06-04 MED ORDER — DEXTROSE-NACL 5-0.45 % IV SOLN
INTRAVENOUS | Status: DC
  Administered 2016-06-04: 19:00:00 via INTRAVENOUS

## 2016-06-04 MED ORDER — PRIMIDONE 50 MG PO TABS
ORAL_TABLET | ORAL | Status: AC
  Filled 2016-06-04: qty 1

## 2016-06-04 MED ORDER — SENNA 8.6 MG PO TABS
1.0000 | ORAL_TABLET | Freq: Every day | ORAL | Status: DC
  Administered 2016-06-04 – 2016-06-07 (×4): 8.6 mg via ORAL
  Filled 2016-06-04 (×4): qty 1

## 2016-06-04 MED ORDER — ALBUTEROL SULFATE (2.5 MG/3ML) 0.083% IN NEBU: 2.5000 mg | INHALATION_SOLUTION | Freq: Four times a day (QID) | RESPIRATORY_TRACT | Status: DC | PRN

## 2016-06-04 NOTE — ED Triage Notes (Addendum)
Pt from [facility], per facility pt has increased fatigue. Facility reports obtained cbc,bmet,ammonia,urinalysis, and depakote level prior to pt being transferred to ED. Facility reported pt family wanted pt evaluated due to history of sepsis. Pt alert. Airway patent. nad noted. cbg en route 171.

## 2016-06-04 NOTE — ED Notes (Signed)
Attempted IV access x2. Initial attempt right forearm. Second attempt right AC.

## 2016-06-04 NOTE — ED Provider Notes (Signed)
[facility] DEPT Provider Note   CSN: [id number] Arrival date & time: 06/04/16  1328   By signing my name below, I, Hilbert Odor, attest that this documentation has been prepared under the direction and in the presence of Margette Fast, MD. Electronically Signed: Hilbert Odor, Scribe. 06/04/16. 8:56 AM. History   Chief Complaint Chief Complaint  Patient presents with  . Fatigue     LEVEL 5 CAVEAT: Alzheimer's Dementia The history is provided by medical records and the nursing home. No language interpreter was used.   HPI Comments: David Collier is a 83 y.o. male with hx of Alzheimer's Dementia was brought in by ambulance, who presents to the Emergency Department by EMS for evaluation of fatigue and generalized weakness. Unknown timeframe. The patient is unable to provide any significant history.   Spoke with April at [facility] who is familiar with the patient. No change in mental status per her. She was with the patient both today and Friday with no change noted. No fever. The only significant medication change was that his trazodone was changed from PRN to nightly. Reports frequent periods of somnolence. He did have a fall several days ago but no obvious head trauma. No recent seizure activity. No fever, cough, or other sign of infection.   Past Medical History:  Diagnosis Date  . Alzheimer's dementia   . Anginal pain (HCC)    NONE IN 1 MONTH   . 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 CAD (coronary artery disease), native coronary artery    Initially diagnosed 2003 2006 catheterization with bare metal stenting of the first marginal branch of the circumflex and continuation branch of the circumflex 2010 catheterization showing normal left main, 50-60% LAD unchanged from before, proximal stent of marginal patent, severe in-stent restenosis treated with cutting balloon angioplasty, patent stent in continuation branch with mild in-stent restenosis, occluded  nondominant right coronary artery.   . CKD (chronic kidney disease), stage III   . Coronary atherosclerosis of native coronary artery    BMS circumflex and OM 2006, PTCA ISR 2010, moderate LAD disease  . Depression   . DJD (degenerative joint disease)   . Headache   . History of stroke    Seen radiographically-left PICA; unknown to patient.  . Hyperlipidemia   . Hypertension   . Macular degeneration   . Myocardial infarction    x5    1980s  sees DR. MICHAEL COOPER  . Seizures [facility])    family is uncertain he has these  . TIA (transient ischemic attack) 03/05/2014    Patient Active Problem List   Diagnosis Date Noted  . Encephalopathy 06/04/2016  . Severe protein-calorie malnutrition ([place]) 06/04/2016  . Anemia 06/04/2016  . Bacteremia 10/08/2015  . Sepsis ([place]) 10/06/2015  . Lower urinary tract infectious disease 10/06/2015  . Acute renal failure superimposed on stage 3 chronic kidney disease ([place]) 10/06/2015  . Acute blood loss anemia 09/18/2015  . Closed left hip fracture ([place]) 09/17/2015  . Trochanteric fracture of right femur ([place]) 06/24/2015  . Community acquired pneumonia 06/24/2015  . Hip fracture ([place]) 06/24/2015  . Obesity (BMI 30-39.9) 07/11/2014  . Dementia 07/11/2014  . Unstable angina pectoris ([place]) 07/11/2014  . 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 CKD (chronic kidney disease), stage III 03/05/2014  . Macular degeneration (senile) of retina 02/16/2009  . Essential hypertension 02/16/2009  . Hyperlipidemia   . CAD (coronary artery  disease), native coronary artery     Past Surgical History:  Procedure Laterality Date  . APPENDECTOMY    . CARDIAC CATHETERIZATION     3 STENTS  AT  [place]  80S OR 90S  . CHOLECYSTECTOMY    . CORONARY ANGIOPLASTY    . FEMUR IM NAIL Left 09/17/2015   Procedure: INTRAMEDULLARY (IM) NAIL  FEMORAL;  Surgeon: Dorna Leitz, MD;  Location: [facility];  Service: Orthopedics;  Laterality: Left;  . INTRAMEDULLARY (IM) NAIL INTERTROCHANTERIC Right 06/25/2015   Procedure: INTRAMEDULLARY (IM) NAIL INTERTROCHANTRIC;  Surgeon: Dorna Leitz, MD;  Location: [facility];  Service: Orthopedics;  Laterality: Right;  . MULTIPLE EXTRACTIONS WITH ALVEOLOPLASTY N/A 10/22/2014   Procedure: MULTIPLE EXTRACTIONS  # 4,6, 7, 8, 10, 21, 22, 23, 24, 25, 26, 27, 28 and ALVEOLOPLASTY;  Surgeon: Diona Browner, DDS;  Location: [facility];  Service: Oral Surgery;  Laterality: N/A;       Home Medications    Prior to Admission medications   Medication Sig Start Date End Date Taking? Authorizing Provider  acetaminophen (TYLENOL) 325 MG tablet Take 650 mg by mouth every 6 (six) hours as needed for mild pain or fever.   Yes Historical Provider, MD  albuterol (PROVENTIL) (2.5 MG/3ML) 0.083% nebulizer solution Take 2.5 mg by nebulization every 6 (six) hours as needed for wheezing or shortness of breath.   Yes Historical Provider, MD  aspirin EC 325 MG EC tablet Take 1 tablet (325 mg total) by mouth daily with breakfast. 09/20/15  Yes Velvet Bathe, MD  cyanocobalamin 500 MCG tablet Take 500 mcg by mouth daily.   Yes Historical Provider, MD  divalproex (DEPAKOTE SPRINKLE) 125 MG capsule Take 250 mg by mouth 3 (three) times daily.    Yes Historical Provider, MD  fluticasone (FLONASE) 50 MCG/ACT nasal spray Place 1 spray into both nostrils daily.   Yes Historical Provider, MD  haloperidol (HALDOL) 0.5 MG tablet Take 0.5 mg by mouth daily.   Yes Historical Provider, MD  HYDROcodone-acetaminophen (NORCO) 5-325 MG tablet Take 1 tablet by mouth every 6 (six) hours as needed for moderate pain. 09/17/15  Yes Gary Fleet, PA-C  iron polysaccharides (NIFEREX) 150 MG capsule Take 150 mg by mouth daily.   Yes Historical Provider, MD  Melatonin 3 MG CAPS Take 3 mg by mouth at bedtime.   Yes Historical Provider, MD  midodrine (PROAMATINE) 2.5 MG tablet  Take 2.5 mg by mouth 3 (three) times daily with meals.   Yes Historical Provider, MD  NITROSTAT 0.4 MG SL tablet DISSOLVE ONE TABLET UNDER THE TONGUE EVERY 5 MINUTES AS NEEDED FOR CHEST PAIN 04/12/14  Yes [name], MD  pantoprazole (PROTONIX) 20 MG tablet Take 1 tablet (20 mg total) by mouth daily. 09/06/15  Yes Daleen Bo, MD  senna (SENOKOT) 8.6 MG TABS tablet Take 1 tablet by mouth at bedtime.   Yes Historical Provider, MD  sertraline (ZOLOFT) 100 MG tablet Take 100 mg by mouth daily.   Yes Historical Provider, MD  traZODone (DESYREL) 50 MG tablet Take 50 mg by mouth at bedtime.    Yes Historical Provider, MD    Family History Family History  Problem Relation Age of Onset  . Heart failure Father   . Heart attack Father   . Heart failure Mother   . CAD Brother   . CAD Brother   . Cancer Brother   . Cancer Brother   . Hypertension Sister   . Heart attack Sister   . Heart failure  Social History Social History  Substance Use Topics  . Smoking status: Former Smoker    Types: Cigarettes    Quit date: 04/23/1966  . Smokeless tobacco: Never Used  . Alcohol use No     Allergies   Aricept [donepezil hcl]; Ativan [lorazepam]; Atorvastatin; Namenda [memantine]; Other; Simvastatin; and Sorbitan   Review of Systems Review of Systems  Unable to perform ROS: Dementia   Level 5 caveat: dementia and AMS.   Physical Exam Updated Vital Signs BP 121/74 (BP Location: Right Arm)   Pulse 79   Temp 98.1 F (36.7 C) (Oral)   Resp 16   Ht 5\' 8"  (1.727 m)   Wt 148 lb 11.2 oz (67.4 kg)   SpO2 95%   BMI 22.61 kg/m   Physical Exam  Constitutional:  Chronically ill-appearing. Somnolent.   HENT:  Head: Normocephalic and atraumatic.  Eyes: Conjunctivae are normal. Pupils are equal, round, and reactive to light. Right eye exhibits no discharge. Left eye exhibits no discharge. No scleral icterus.  Neck: Neck supple.  Cardiovascular: Normal rate and regular rhythm.   No  murmur heard. Pulmonary/Chest: Effort normal and breath sounds normal. No stridor. No respiratory distress.  Abdominal: Soft. He exhibits no distension. There is no tenderness.  Musculoskeletal: Normal range of motion. He exhibits no edema or deformity.  Neurological:  Somnolent. No gross neurological deficit.   Skin: Skin is warm and dry. No rash noted.  Nursing note and vitals reviewed.   ED Treatments / Results  DIAGNOSTIC STUDIES: Oxygen Saturation is 96% on RA, normal by my interpretation.    Labs (all labs ordered are listed, but only abnormal results are displayed) Labs Reviewed  BASIC METABOLIC PANEL - Abnormal; Notable for the following:       Result Value   BUN 31 (*)    Creatinine, Ser 1.47 (*)    Calcium 8.6 (*)    GFR calc non Af Amer 42 (*)    GFR calc Af Amer 48 (*)    All other components within normal limits  CBC - Abnormal; Notable for the following:    WBC 14.6 (*)    RBC 3.30 (*)    Hemoglobin 9.9 (*)    HCT 30.6 (*)    RDW 16.5 (*)    All other components within normal limits  URINALYSIS, ROUTINE W REFLEX MICROSCOPIC - Abnormal; Notable for the following:    APPearance TURBID (*)    Hgb urine dipstick SMALL (*)    Protein, ur 100 (*)    Leukocytes, UA LARGE (*)    Bacteria, UA RARE (*)    Non Squamous Epithelial 0-5 (*)    All other components within normal limits  HEPATIC FUNCTION PANEL - Abnormal; Notable for the following:    Total Protein 6.1 (*)    Albumin 2.4 (*)    AST 13 (*)    ALT 9 (*)    All other components within normal limits  VALPROIC ACID LEVEL - Abnormal; Notable for the following:    Valproic Acid Lvl 23 (*)    All other components within normal limits  BASIC METABOLIC PANEL - Abnormal; Notable for the following:    BUN 24 (*)    Calcium 8.3 (*)    GFR calc non Af Amer 59 (*)    All other components within normal limits  CBC - Abnormal; Notable for the following:    WBC 11.6 (*)    RBC 3.11 (*)    Hemoglobin 9.3 (*)  HCT 28.5 (*)    RDW 16.0 (*)    All other components within normal limits  CULTURE, BLOOD (ROUTINE X 2)  CULTURE, BLOOD (ROUTINE X 2)  MRSA PCR SCREENING  URINE CULTURE  LIPASE, BLOOD  I-STAT TROPOININ, ED  I-STAT CG4 LACTIC ACID, ED  POC OCCULT BLOOD, ED    EKG  EKG Interpretation  Date/Time:  Monday June 04 2016 13:33:17 EST Ventricular Rate:  91 PR Interval:    QRS Duration: 131 QT Interval:  363 QTC Calculation: 447 R Axis:   46 Text Interpretation:  Sinus rhythm Borderline prolonged PR interval Right bundle branch block No STEMI. Similar to prior.  Confirmed by LONG MD, JOSHUA [phone]) on 06/04/2016 1:56:59 PM       Radiology Dg Chest 2 View  Result Date: 06/04/2016 CLINICAL DATA:  83 year old with current history of Alzheimer's dementia, presenting to the emergency room with fatigue and generalized weakness. EXAM: CHEST  2 VIEW COMPARISON:  02/13/2016, 11/29/2015 and earlier. FINDINGS: AP erect and lateral images were obtained. Cardiac silhouette mildly enlarged, unchanged. Thoracic aorta atherosclerotic, unchanged. Apparent superior mediastinal widening is felt to be due to mediastinal fat and AP technique. Respiratory motion blurred the lateral image. Consolidation in the right lower lobe. Lungs otherwise clear. Pulmonary vascularity normal. No visible pleural effusions. Severe T11 compression fracture with an associated kyphous deformity as noted previously. IMPRESSION: 1. Right lower lobe atelectasis and/or pneumonia. 2. Stable mild cardiomegaly without pulmonary edema. Electronically Signed   By: Evangeline Dakin M.D.   On: 06/04/2016 15:33   Ct Head Wo Contrast  Result Date: 06/04/2016 CLINICAL DATA:  Altered mental status. Fatigue. Alzheimer's. No known injury. EXAM: CT HEAD WITHOUT CONTRAST TECHNIQUE: Contiguous axial images were obtained from the base of the skull through the vertex without intravenous contrast. COMPARISON:  05/12/2016 FINDINGS: Brain: There is  moderate central and cortical atrophy. Periventricular white matter changes are consistent with small vessel disease. Vascular: There is significant atherosclerotic calcification of the internal carotid arteries. Skull: No acute calvarial abnormality. Sinuses/Orbits: No acute finding. Other: Subacute fracture of the nasal bones. IMPRESSION: 1.  No evidence for acute intracranial abnormality. 2. Significant atrophy and small vessel disease. 3. Subacute fracture of the nasal bones Electronically Signed   By: Nolon Nations M.D.   On: 06/04/2016 15:49    Procedures Procedures (including critical care time)  Medications Ordered in ED Medications  dextrose 5 %-0.45 % sodium chloride infusion ( Intravenous New Bag/Given 06/04/16 1858)  albuterol (PROVENTIL) (2.5 MG/3ML) 0.083% nebulizer solution 2.5 mg (not administered)  divalproex (DEPAKOTE SPRINKLE) capsule 250 mg (250 mg Oral Given 06/04/16 2237)  fluticasone (FLONASE) 50 MCG/ACT nasal spray 1 spray (not administered)  haloperidol (HALDOL) tablet 0.5 mg (0.5 mg Oral Given 06/05/16 0155)  midodrine (PROAMATINE) tablet 2.5 mg (not administered)  senna (SENOKOT) tablet 8.6 mg (8.6 mg Oral Given 06/04/16 2237)  aspirin EC tablet 325 mg (not administered)  HYDROcodone-acetaminophen (NORCO/VICODIN) 5-325 MG per tablet 1 tablet (not administered)  sertraline (ZOLOFT) tablet 100 mg (not administered)  acetaminophen (TYLENOL) tablet 650 mg (not administered)    Or  acetaminophen (TYLENOL) suppository 650 mg (not administered)  ondansetron (ZOFRAN) tablet 4 mg (not administered)    Or  ondansetron (ZOFRAN) injection 4 mg (not administered)  enoxaparin (LOVENOX) injection 40 mg (40 mg Subcutaneous Given 06/04/16 2237)  meropenem (MERREM) 1 g in sodium chloride 0.9 % 100 mL IVPB (1 g Intravenous Given 06/04/16 2237)  pantoprazole (PROTONIX) EC tablet 40 mg (not administered)  cefTRIAXone (ROCEPHIN) 1 g in dextrose 5 % 50 mL IVPB (0 g Intravenous Stopped  06/04/16 2010)     Initial Impression / Assessment and Plan / ED Course  I have reviewed the triage vital signs and the nursing notes.  Pertinent labs & imaging results that were available during my care of the patient were reviewed by me and considered in my medical decision making (see chart for details).  Patient with AMS and history of dementia. Family at bedside report increased somnolence over the last 2 days. Had similar episode of urosepsis in 09/2015 which presented in a similar way. CT head negative. Questionable PNA on CXR with no hypoxemia, SOB, or cough. Patient with significant UTI which is likely causing his symptoms. Started Ceftriaxone and will admit to follow cultures. Updated family at bedside.   Discussed patient's case with Hospitalist, Dr. Lorin Mercy. Patient and family (if present) updated with plan. Care transferred to Hospitalist service.  I reviewed all nursing notes, vitals, pertinent old records, EKGs, labs, imaging (as available).   Final Clinical Impressions(s) / ED Diagnoses   Final diagnoses:  Somnolence  Urinary tract infection without hematuria, site unspecified   I personally performed the services described in this documentation, which was scribed in my presence. The recorded information has been reviewed and is accurate.   Nanda Quinton, MD   Margette Fast, MD 06/05/16 [id number]

## 2016-06-04 NOTE — H&P (Addendum)
History and Physical    David Collier [id number] DOB: [birth year] DOA: 06/04/2016  PCP: Renata Caprice, DO - ?Doctor at [employer]:  None Patient coming from:  [facility] - has been there since May 2017; NOK: daughter and her sister; [phone]  Chief Complaint: AMS  HPI: David Collier is a 82 y.o. male with medical history significant of TIA, CAD, HTN, HLD, CKD, advanced dementia, and hospitalization for sepsis from a urinary source with ESBL organism in 6/17 presenting with AMS.  Since Saturday evening, he has been sleeping all the time.  This is generally the first sign of him having a UTI.  Yesterday, he became lethargic like he was when he was septic last June - wouldn't respond, wouldn't drink.  They spoke with the nurse at [facility] and asked for evaluation for UTI.  Daughter called in today to check and then came to see him and he is even worse than he was.  They requested for him to be transferred to the ER.  UA was done yesterday PM, blood work was this AM and then they did more this afternoon; none of these results were back.  They were waiting for the doctor or NP to determine if he needed to come to the ER and the family got frustrtated and decided to call 911.  No fevers.    Has advanced dementia.  At baseline, wheels himself around in his wheelchair.  Has not recognized family for weeks to months.  Occasionally able to have a limited conversation.  Persistent unintentional weight loss.  He was given Rocephin and IVF in the ER.  Review of Systems: unable to obtain  Ambulatory Status:  Bedbound/wheelchair, he does try to stand but huge fall risk  Past Medical History:  Diagnosis Date  . Alzheimer's dementia   . Anginal pain (HCC)    NONE IN 1 MONTH   . 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 CAD (coronary artery disease), native coronary artery    Initially diagnosed 2003 2006 catheterization with bare metal stenting of the first marginal branch of the  circumflex and continuation branch of the circumflex 2010 catheterization showing normal left main, 50-60% LAD unchanged from before, proximal stent of marginal patent, severe in-stent restenosis treated with cutting balloon angioplasty, patent stent in continuation branch with mild in-stent restenosis, occluded nondominant right coronary artery.   . CKD (chronic kidney disease), stage III   . Coronary atherosclerosis of native coronary artery    BMS circumflex and OM 2006, PTCA ISR 2010, moderate LAD disease  . Depression   . DJD (degenerative joint disease)   . Headache   . History of stroke    Seen radiographically-left PICA; unknown to patient.  . Hyperlipidemia   . Hypertension   . Macular degeneration   . Myocardial infarction    x5    1980s  sees DR. MICHAEL COOPER  . Seizures [facility]    family is uncertain he has these  . TIA (transient ischemic attack) 03/05/2014    Past Surgical History:  Procedure Laterality Date  . APPENDECTOMY    . CARDIAC CATHETERIZATION     3 STENTS  AT  [place]  80S OR 90S  . CHOLECYSTECTOMY    . CORONARY ANGIOPLASTY    . FEMUR IM NAIL Left 09/17/2015   Procedure: INTRAMEDULLARY (IM) NAIL FEMORAL;  Surgeon: Dorna Leitz, MD;  Location: [facility];  Service: Orthopedics;  Laterality: Left;  . INTRAMEDULLARY (IM) NAIL INTERTROCHANTERIC Right 06/25/2015   Procedure: INTRAMEDULLARY (  IM) NAIL INTERTROCHANTRIC;  Surgeon: Dorna Leitz, MD;  Location: [facility];  Service: Orthopedics;  Laterality: Right;  . MULTIPLE EXTRACTIONS WITH ALVEOLOPLASTY N/A 10/22/2014   Procedure: MULTIPLE EXTRACTIONS  # 4,6, 7, 8, 10, 21, 22, 23, 24, 25, 26, 27, 28 and ALVEOLOPLASTY;  Surgeon: Diona Browner, DDS;  Location: [facility];  Service: Oral Surgery;  Laterality: N/A;    Social History   Social History  . Marital status: Widowed    Spouse name: N/A  . Number of children: N/A  . Years of education: N/A   Occupational History  . Not on file.   Social History Main Topics  . Smoking  status: Former Smoker    Types: Cigarettes    Quit date: 04/23/1966  . Smokeless tobacco: Never Used  . Alcohol use No  . Drug use: No  . Sexual activity: Not on file   Other Topics Concern  . Not on file   Social History Narrative   Retired [employer], lives with wife    Allergies  Allergen Reactions  . Aricept [Donepezil Hcl] Other (See Comments)    Altered mental status  . Ativan [Lorazepam] Other (See Comments)    Altered mental status  . Atorvastatin Nausea And Vomiting and Other (See Comments)    Joint pain  . Namenda [Memantine] Other (See Comments)    Altered mental status  . Other     Wife says pt was given 2 different medications for his nerves but said they made him have SI.  Marland Kitchen Simvastatin Nausea And Vomiting and Other (See Comments)    Joint pain  . Sorbitan Nausea And Vomiting    headache    Family History  Problem Relation Age of Onset  . Heart failure Father   . Heart attack Father   . Heart failure Mother   . CAD Brother   . CAD Brother   . Cancer Brother   . Cancer Brother   . Hypertension Sister   . Heart attack Sister   . Heart failure      Prior to Admission medications   Medication Sig Start Date End Date Taking? Authorizing Provider  acetaminophen (TYLENOL) 325 MG tablet Take 650 mg by mouth every 6 (six) hours as needed for mild pain or fever.   Yes Historical Provider, MD  albuterol (PROVENTIL) (2.5 MG/3ML) 0.083% nebulizer solution Take 2.5 mg by nebulization every 6 (six) hours as needed for wheezing or shortness of breath.   Yes Historical Provider, MD  aspirin EC 325 MG EC tablet Take 1 tablet (325 mg total) by mouth daily with breakfast. 09/20/15  Yes Velvet Bathe, MD  cyanocobalamin 500 MCG tablet Take 500 mcg by mouth daily.   Yes Historical Provider, MD  divalproex (DEPAKOTE SPRINKLE) 125 MG capsule Take 250 mg by mouth 3 (three) times daily.    Yes Historical Provider, MD  fluticasone (FLONASE) 50 MCG/ACT nasal spray Place 1  spray into both nostrils daily.   Yes Historical Provider, MD  haloperidol (HALDOL) 0.5 MG tablet Take 0.5 mg by mouth daily.   Yes Historical Provider, MD  HYDROcodone-acetaminophen (NORCO) 5-325 MG tablet Take 1 tablet by mouth every 6 (six) hours as needed for moderate pain. 09/17/15  Yes Gary Fleet, PA-C  iron polysaccharides (NIFEREX) 150 MG capsule Take 150 mg by mouth daily.   Yes Historical Provider, MD  Melatonin 3 MG CAPS Take 3 mg by mouth at bedtime.   Yes Historical Provider, MD  midodrine (PROAMATINE) 2.5 MG  tablet Take 2.5 mg by mouth 3 (three) times daily with meals.   Yes Historical Provider, MD  NITROSTAT 0.4 MG SL tablet DISSOLVE ONE TABLET UNDER THE TONGUE EVERY 5 MINUTES AS NEEDED FOR CHEST PAIN 04/12/14  Yes [name], MD  pantoprazole (PROTONIX) 20 MG tablet Take 1 tablet (20 mg total) by mouth daily. 09/06/15  Yes Daleen Bo, MD  senna (SENOKOT) 8.6 MG TABS tablet Take 1 tablet by mouth at bedtime.   Yes Historical Provider, MD  sertraline (ZOLOFT) 100 MG tablet Take 100 mg by mouth daily.   Yes Historical Provider, MD  traZODone (DESYREL) 50 MG tablet Take 50 mg by mouth at bedtime.    Yes Historical Provider, MD    Physical Exam: Vitals:   06/04/16 1630 06/04/16 1735 06/04/16 1800 06/04/16 1930  BP: 133/81 113/66 103/64 123/71  Pulse: 88 82 76 83  Resp: 15 14 14 15   Temp:      TempSrc:      SpO2: 96% 97% 95% 97%  Weight:      Height:         General:  Appears calm and comfortable and is NAD, somnolent, opens eyes to voice but does not attempt to engage Eyes:  PERRL, EOMI, normal lids, iris ENT: hard of hearing, normal lips & tongue, mmm Neck:  no LAD, masses or thyromegaly Cardiovascular:  RRR, no m/r/g. No LE edema.  Respiratory:  CTA bilaterally, no w/r/r. Normal respiratory effort. Abdomen:  soft, ntnd, NABS Skin:  no rash or induration seen on limited exam Musculoskeletal:  grossly normal tone BUE/BLE, good ROM, no bony  abnormality Psychiatric:  Opens eyes to voice and utters some sounds but nothing sensical Neurologic:  Unable to perform  Labs on Admission: I have personally reviewed following labs and imaging studies  CBC:  Recent Labs Lab 06/04/16 1411  WBC 14.6*  HGB 9.9*  HCT 30.6*  MCV 92.7  PLT [id number]   Basic Metabolic Panel:  Recent Labs Lab 06/04/16 1411  NA 137  K 3.8  CL 103  CO2 28  GLUCOSE 89  BUN 31*  CREATININE 1.47*  CALCIUM 8.6*   GFR: Estimated Creatinine Clearance: 35.5 mL/min (by C-G formula based on SCr of 1.47 mg/dL (H)). Liver Function Tests:  Recent Labs Lab 06/04/16 1411  AST 13*  ALT 9*  ALKPHOS 59  BILITOT 0.7  PROT 6.1*  ALBUMIN 2.4*    Recent Labs Lab 06/04/16 1411  LIPASE 14   No results for input(s): AMMONIA in the last 168 hours. Coagulation Profile: No results for input(s): INR, PROTIME in the last 168 hours. Cardiac Enzymes: No results for input(s): CKTOTAL, CKMB, CKMBINDEX, TROPONINI in the last 168 hours. BNP (last 3 results) No results for input(s): PROBNP in the last 8760 hours. HbA1C: No results for input(s): HGBA1C in the last 72 hours. CBG: No results for input(s): GLUCAP in the last 168 hours. Lipid Profile: No results for input(s): CHOL, HDL, LDLCALC, TRIG, CHOLHDL, LDLDIRECT in the last 72 hours. Thyroid Function Tests: No results for input(s): TSH, T4TOTAL, FREET4, T3FREE, THYROIDAB in the last 72 hours. Anemia Panel: No results for input(s): VITAMINB12, FOLATE, FERRITIN, TIBC, IRON, RETICCTPCT in the last 72 hours. Urine analysis:    Component Value Date/Time   COLORURINE YELLOW 06/04/2016 1335   APPEARANCEUR TURBID (A) 06/04/2016 1335   LABSPEC 1.015 06/04/2016 1335   PHURINE 6.0 06/04/2016 1335   GLUCOSEU NEGATIVE 06/04/2016 1335   HGBUR SMALL (A) 06/04/2016 1335   BILIRUBINUR  NEGATIVE 06/04/2016 1335   KETONESUR NEGATIVE 06/04/2016 1335   PROTEINUR 100 (A) 06/04/2016 1335   UROBILINOGEN 0.2 03/04/2015 1840    NITRITE NEGATIVE 06/04/2016 1335   LEUKOCYTESUR LARGE (A) 06/04/2016 1335    Creatinine Clearance: Estimated Creatinine Clearance: 35.5 mL/min (by C-G formula based on SCr of 1.47 mg/dL (H)).  Sepsis Labs: @LABRCNTIP (procalcitonin:4,lacticidven:4) )No results found for this or any previous visit (from the past 240 hour(s)).   Radiological Exams on Admission: Dg Chest 2 View  Result Date: 06/04/2016 CLINICAL DATA:  83 year old with current history of Alzheimer's dementia, presenting to the emergency room with fatigue and generalized weakness. EXAM: CHEST  2 VIEW COMPARISON:  02/13/2016, 11/29/2015 and earlier. FINDINGS: AP erect and lateral images were obtained. Cardiac silhouette mildly enlarged, unchanged. Thoracic aorta atherosclerotic, unchanged. Apparent superior mediastinal widening is felt to be due to mediastinal fat and AP technique. Respiratory motion blurred the lateral image. Consolidation in the right lower lobe. Lungs otherwise clear. Pulmonary vascularity normal. No visible pleural effusions. Severe T11 compression fracture with an associated kyphous deformity as noted previously. IMPRESSION: 1. Right lower lobe atelectasis and/or pneumonia. 2. Stable mild cardiomegaly without pulmonary edema. Electronically Signed   By: Evangeline Dakin M.D.   On: 06/04/2016 15:33   Ct Head Wo Contrast  Result Date: 06/04/2016 CLINICAL DATA:  Altered mental status. Fatigue. Alzheimer's. No known injury. EXAM: CT HEAD WITHOUT CONTRAST TECHNIQUE: Contiguous axial images were obtained from the base of the skull through the vertex without intravenous contrast. COMPARISON:  05/12/2016 FINDINGS: Brain: There is moderate central and cortical atrophy. Periventricular white matter changes are consistent with small vessel disease. Vascular: There is significant atherosclerotic calcification of the internal carotid arteries. Skull: No acute calvarial abnormality. Sinuses/Orbits: No acute finding. Other:  Subacute fracture of the nasal bones. IMPRESSION: 1.  No evidence for acute intracranial abnormality. 2. Significant atrophy and small vessel disease. 3. Subacute fracture of the nasal bones Electronically Signed   By: Nolon Nations M.D.   On: 06/04/2016 15:49    EKG: Independently reviewed.  NSR with rate 91; RBBB, NSCSLT  Assessment/Plan Principal Problem:   Encephalopathy Active Problems:   CKD (chronic kidney disease), stage III   Dementia   Lower urinary tract infectious disease   Severe protein-calorie malnutrition (HCC)   Anemia   Encephalopathy -Patient with baseline dementia (see below) presenting with acute worsening -Appears to be infectious in etiology, WBC 14.6 -Similar presentation with sepsis in 6/17, found to have E coli UTI, ESBL, sensitive to Gent, Zosyn, Primaxin -Today's UA: large LE, negative nitrite, TNTC WBC with clumps present, rare bacteria -Abnormal CXR but patient without any respiratory symptoms, no fever -Hemodynamically stable today, does not meet sepsis criteria -Most likely source of AMS is acute UTI -Based on prior MDR infection, will treat with Zosyn -Blood and urine cultures pending -With uncomplicated UTI, he would be expected to improve reasonably quickly and so observation status is appropriate -However, with his advanced dementia, it is possible that his delirium will take longer to improve -Additionally, with each subsequent infection, his return to baseline may be delayed or he may have a new baseline -This was all addressed with his daughter at length  Dementia -Appears to have advancing disease - diminished recognition of family, weight loss, recurrent illness, overall failure to thrive -He requested to be DNR -His daughter reports that he would not want to live this way -She is very emotional but understands the situation -Daughter requests to be notified of any change  in status -Would suggest discussion with daughter prior to any  escalation of care -Valproic acid 23, unchanged from prior admission; this appears to be used for behavior management, since it is not therapeutic (and wasn't prior) and daughter is not aware of h/o seizures -Fall precautions -If he becomes agitated and/or tries to get out of bed, may need a sitter/chemical/physical restraints for safety  CKD -Creatinine 1.47, prior baseline 1.1-1.3 -Possible mild AKI -Will rehydrate and follow, but it is possible this is roughly his baseline  Malnutrition -Albumin 2.4 -Will place nutrition consult  Anemia -Hgb 9.9 -Appears to be stable -Possibly secondary to renal disease -Will follow   DVT prophylaxis: Lovenox  Code Status: DNR - confirmed with family Family Communication: Daughter and son-in-law present throughout evaluation Disposition Plan:  Home once clinically improved Consults called: Nutrition Admission status: It is my clinical opinion that referral for OBSERVATION is reasonable and necessary in this patient based on the above information provided. The aforementioned taken together are felt to place the patient at high risk for further clinical deterioration. However it is anticipated that the patient may be medically stable for discharge from the hospital within 24 to 48 hours.    Addendum: Called by pharmacy - Carbapenems are the preferred drug for ESBL E coli.  Will change to Primaxin instead.  Karmen Bongo MD [facility]  If 7PM-7AM, please contact night-coverage [email or web address] Password [facility]  06/04/2016, 8:34 PM

## 2016-06-04 NOTE — Progress Notes (Signed)
Pharmacy Antibiotic Note  David Collier is a 83 y.o. male admitted on 06/04/2016 with UTI.  Pharmacy has been consulted for Meropenem dosing.  Plan: Meropenem 1gm IV every 12 hours. Monitor labs, micro and vitals.   Height: 5\' 8"  (172.7 cm) Weight: 148 lb 11.2 oz (67.4 kg) IBW/kg (Calculated) : 68.4  Temp (24hrs), Avg:98.2 F (36.8 C), Min:97.9 F (36.6 C), Max:98.4 F (36.9 C)   Recent Labs Lab 06/04/16 1411 06/04/16 1526  WBC 14.6*  --   CREATININE 1.47*  --   LATICACIDVEN  --  0.72    Estimated Creatinine Clearance: 35.1 mL/min (by C-G formula based on SCr of 1.47 mg/dL (H)).    Allergies  Allergen Reactions  . Aricept [Donepezil Hcl] Other (See Comments)    Altered mental status  . Ativan [Lorazepam] Other (See Comments)    Altered mental status  . Atorvastatin Nausea And Vomiting and Other (See Comments)    Joint pain  . Namenda [Memantine] Other (See Comments)    Altered mental status  . Other     Wife says pt was given 2 different medications for his nerves but said they made him have SI.  Marland Kitchen Simvastatin Nausea And Vomiting and Other (See Comments)    Joint pain  . Sorbitan Nausea And Vomiting    headache    Antimicrobials this admission: Meropnem 2/12 >>  j Dose adjustments this admission: n/a   Microbiology results: 2/12 UCx:   2/12 MRSA PCR:   Thank you for allowing pharmacy to be a part of this patient's care.  Pricilla Larsson 06/04/2016 8:52 PM

## 2016-06-05 DIAGNOSIS — I200 Unstable angina: Secondary | ICD-10-CM | POA: Diagnosis not present

## 2016-06-05 DIAGNOSIS — G309 Alzheimer's disease, unspecified: Secondary | ICD-10-CM | POA: Diagnosis present

## 2016-06-05 DIAGNOSIS — E785 Hyperlipidemia, unspecified: Secondary | ICD-10-CM | POA: Diagnosis not present

## 2016-06-05 DIAGNOSIS — I10 Essential (primary) hypertension: Secondary | ICD-10-CM | POA: Diagnosis not present

## 2016-06-05 DIAGNOSIS — R2681 Unsteadiness on feet: Secondary | ICD-10-CM | POA: Diagnosis not present

## 2016-06-05 DIAGNOSIS — M6281 Muscle weakness (generalized): Secondary | ICD-10-CM | POA: Diagnosis not present

## 2016-06-05 DIAGNOSIS — Z8673 Personal history of transient ischemic attack (TIA), and cerebral infarction without residual deficits: Secondary | ICD-10-CM | POA: Diagnosis not present

## 2016-06-05 DIAGNOSIS — Z87891 Personal history of nicotine dependence: Secondary | ICD-10-CM | POA: Diagnosis not present

## 2016-06-05 DIAGNOSIS — W050XXA Fall from non-moving wheelchair, initial encounter: Secondary | ICD-10-CM | POA: Diagnosis present

## 2016-06-05 DIAGNOSIS — D649 Anemia, unspecified: Secondary | ICD-10-CM | POA: Diagnosis present

## 2016-06-05 DIAGNOSIS — H3530 Unspecified macular degeneration: Secondary | ICD-10-CM | POA: Diagnosis not present

## 2016-06-05 DIAGNOSIS — Z7401 Bed confinement status: Secondary | ICD-10-CM | POA: Diagnosis not present

## 2016-06-05 DIAGNOSIS — Z1612 Extended spectrum beta lactamase (ESBL) resistance: Secondary | ICD-10-CM | POA: Diagnosis present

## 2016-06-05 DIAGNOSIS — R4701 Aphasia: Secondary | ICD-10-CM | POA: Diagnosis not present

## 2016-06-05 DIAGNOSIS — I252 Old myocardial infarction: Secondary | ICD-10-CM | POA: Diagnosis not present

## 2016-06-05 DIAGNOSIS — G9340 Encephalopathy, unspecified: Secondary | ICD-10-CM | POA: Diagnosis not present

## 2016-06-05 DIAGNOSIS — J189 Pneumonia, unspecified organism: Secondary | ICD-10-CM | POA: Diagnosis present

## 2016-06-05 DIAGNOSIS — Z8249 Family history of ischemic heart disease and other diseases of the circulatory system: Secondary | ICD-10-CM | POA: Diagnosis not present

## 2016-06-05 DIAGNOSIS — F0390 Unspecified dementia without behavioral disturbance: Secondary | ICD-10-CM

## 2016-06-05 DIAGNOSIS — I2510 Atherosclerotic heart disease of native coronary artery without angina pectoris: Secondary | ICD-10-CM | POA: Diagnosis not present

## 2016-06-05 DIAGNOSIS — F0280 Dementia in other diseases classified elsewhere without behavioral disturbance: Secondary | ICD-10-CM | POA: Diagnosis present

## 2016-06-05 DIAGNOSIS — I672 Cerebral atherosclerosis: Secondary | ICD-10-CM | POA: Diagnosis not present

## 2016-06-05 DIAGNOSIS — R279 Unspecified lack of coordination: Secondary | ICD-10-CM | POA: Diagnosis not present

## 2016-06-05 DIAGNOSIS — R05 Cough: Secondary | ICD-10-CM | POA: Diagnosis not present

## 2016-06-05 DIAGNOSIS — E46 Unspecified protein-calorie malnutrition: Secondary | ICD-10-CM | POA: Diagnosis not present

## 2016-06-05 DIAGNOSIS — S72002D Fracture of unspecified part of neck of left femur, subsequent encounter for closed fracture with routine healing: Secondary | ICD-10-CM | POA: Diagnosis not present

## 2016-06-05 DIAGNOSIS — Z79899 Other long term (current) drug therapy: Secondary | ICD-10-CM | POA: Diagnosis not present

## 2016-06-05 DIAGNOSIS — I739 Peripheral vascular disease, unspecified: Secondary | ICD-10-CM | POA: Diagnosis present

## 2016-06-05 DIAGNOSIS — R400 Somnolence: Secondary | ICD-10-CM | POA: Diagnosis present

## 2016-06-05 DIAGNOSIS — Z993 Dependence on wheelchair: Secondary | ICD-10-CM | POA: Diagnosis not present

## 2016-06-05 DIAGNOSIS — Z6822 Body mass index (BMI) 22.0-22.9, adult: Secondary | ICD-10-CM | POA: Diagnosis not present

## 2016-06-05 DIAGNOSIS — Z66 Do not resuscitate: Secondary | ICD-10-CM | POA: Diagnosis present

## 2016-06-05 DIAGNOSIS — D62 Acute posthemorrhagic anemia: Secondary | ICD-10-CM | POA: Diagnosis not present

## 2016-06-05 DIAGNOSIS — N183 Chronic kidney disease, stage 3 (moderate): Secondary | ICD-10-CM

## 2016-06-05 DIAGNOSIS — R1312 Dysphagia, oropharyngeal phase: Secondary | ICD-10-CM | POA: Diagnosis not present

## 2016-06-05 DIAGNOSIS — Z888 Allergy status to other drugs, medicaments and biological substances status: Secondary | ICD-10-CM | POA: Diagnosis not present

## 2016-06-05 DIAGNOSIS — Z7951 Long term (current) use of inhaled steroids: Secondary | ICD-10-CM | POA: Diagnosis not present

## 2016-06-05 DIAGNOSIS — Z515 Encounter for palliative care: Secondary | ICD-10-CM | POA: Diagnosis not present

## 2016-06-05 DIAGNOSIS — Z7982 Long term (current) use of aspirin: Secondary | ICD-10-CM | POA: Diagnosis not present

## 2016-06-05 DIAGNOSIS — R627 Adult failure to thrive: Secondary | ICD-10-CM | POA: Diagnosis present

## 2016-06-05 DIAGNOSIS — I129 Hypertensive chronic kidney disease with stage 1 through stage 4 chronic kidney disease, or unspecified chronic kidney disease: Secondary | ICD-10-CM | POA: Diagnosis present

## 2016-06-05 DIAGNOSIS — Z955 Presence of coronary angioplasty implant and graft: Secondary | ICD-10-CM | POA: Diagnosis not present

## 2016-06-05 DIAGNOSIS — N390 Urinary tract infection, site not specified: Secondary | ICD-10-CM | POA: Diagnosis not present

## 2016-06-05 DIAGNOSIS — E43 Unspecified severe protein-calorie malnutrition: Secondary | ICD-10-CM | POA: Diagnosis present

## 2016-06-05 LAB — BASIC METABOLIC PANEL WITH GFR
Anion gap: 7 (ref 5–15)
BUN: 24 mg/dL — ABNORMAL HIGH (ref 6–20)
CO2: 27 mmol/L (ref 22–32)
Calcium: 8.3 mg/dL — ABNORMAL LOW (ref 8.9–10.3)
Chloride: 101 mmol/L (ref 101–111)
Creatinine, Ser: 1.11 mg/dL (ref 0.61–1.24)
GFR calc Af Amer: >60 mL/min
GFR calc non Af Amer: 59 mL/min — ABNORMAL LOW
Glucose, Bld: 82 mg/dL (ref 65–99)
Potassium: 3.7 mmol/L (ref 3.5–5.1)
Sodium: 135 mmol/L (ref 135–145)

## 2016-06-05 LAB — CBC
HCT: 28.5 % — ABNORMAL LOW (ref 39.0–52.0)
Hemoglobin: 9.3 g/dL — ABNORMAL LOW (ref 13.0–17.0)
MCH: 29.9 pg (ref 26.0–34.0)
MCHC: 32.6 g/dL (ref 30.0–36.0)
MCV: 91.6 fL (ref 78.0–100.0)
PLATELETS: 229 10*3/uL (ref 150–400)
RBC: 3.11 MIL/uL — AB (ref 4.22–5.81)
RDW: 16 % — AB (ref 11.5–15.5)
WBC: 11.6 10*3/uL — AB (ref 4.0–10.5)

## 2016-06-05 MED ORDER — PANTOPRAZOLE SODIUM 40 MG PO TBEC
40.0000 mg | DELAYED_RELEASE_TABLET | Freq: Every day | ORAL | Status: DC
  Administered 2016-06-05 – 2016-06-08 (×4): 40 mg via ORAL
  Filled 2016-06-05 (×4): qty 1

## 2016-06-05 MED ORDER — ENSURE ENLIVE PO LIQD
237.0000 mL | Freq: Three times a day (TID) | ORAL | Status: DC
  Administered 2016-06-05 – 2016-06-08 (×4): 237 mL via ORAL

## 2016-06-05 NOTE — Progress Notes (Signed)
PROGRESS NOTE    David Collier  [id number] DOB: 03-29-1929 DOA: 06/04/2016 PCP: Renata Caprice, DO     Brief Narrative:  83 y/o man with dementia admitted from SNF on 2/12 due to concerns for acute encephalopathy. Found to have a UTI and admission requested for further evaluation and management.   Assessment & Plan:   Principal Problem:   Encephalopathy Active Problems:   CKD (chronic kidney disease), stage III   Dementia   Lower urinary tract infectious disease   Severe protein-calorie malnutrition (HCC)   Anemia   Acute Encephalopathy -Due to UTI on top of baseline severe dementia. -Due to h/o ESBL E Coli in 6/17 has been placed on meropenem pending cx data. -Mental status is improved, but per family, still not back to baseline. -Does not meet sepsis criteria. -Have discussed with family that due to severe baseline dementia, he may not return to prior level of functioning. May benefit from palliative care evaluation in the near future to further address natural progression of dementia and family coping mechanisms.  CKD Stage II-III -Cr is at baseline of 1.1-1.3.   DVT prophylaxis: lovenox Code Status: DNR Family Communication: daughter, SIL and granddaughter at bedside updated on plan of care and all questions answered Disposition Plan: Back to SNF once medically ready; anticipate 24-48 hours.  Consultants:   None  Procedures:   None  Antimicrobials:  Anti-infectives    Start     Dose/Rate Route Frequency Ordered Stop   06/04/16 2200  meropenem (MERREM) 1 g in sodium chloride 0.9 % 100 mL IVPB     1 g 200 mL/hr over 30 Minutes Intravenous Every 12 hours 06/04/16 2052     06/04/16 1830  cefTRIAXone (ROCEPHIN) 1 g in dextrose 5 % 50 mL IVPB     1 g 100 mL/hr over 30 Minutes Intravenous  Once 06/04/16 1823 06/04/16 2010       Subjective: Opens eyes to voice, not conversant, minimal interaction  Objective: Vitals:   06/04/16 1930 06/04/16 2043  06/05/16 0454 06/05/16 1515  BP: 123/71 (!) 150/86 121/74 116/70  Pulse: 83 85 79 76  Resp: 15 16 16 16   Temp:  97.9 F (36.6 C) 98.1 F (36.7 C) 97.5 F (36.4 C)  TempSrc:  Oral Oral Oral  SpO2: 97% 93% 95% 98%  Weight:  67.4 kg (148 lb 11.2 oz)    Height:  5\' 8"  (1.727 m)      Intake/Output Summary (Last 24 hours) at 06/05/16 1737 Last data filed at 06/05/16 1300  Gross per 24 hour  Intake          1401.67 ml  Output              250 ml  Net          1151.67 ml   Filed Weights   06/04/16 1331 06/04/16 2043  Weight: 72.6 kg (160 lb) 67.4 kg (148 lb 11.2 oz)    Examination:  General exam: drowsy, not able to follow commands Respiratory system: Clear to auscultation. Respiratory effort normal. Cardiovascular system:RRR. No murmurs, rubs, gallops. Gastrointestinal system: Abdomen is nondistended, soft and nontender. No organomegaly or masses felt. Normal bowel sounds heard. Central nervous system: moves all 4 spontaneously Extremities: No C/C/E, +pedal pulses Skin: No rashes, lesions or ulcers Psychiatry: Unable to assess given current mental state    Data Reviewed: I have personally reviewed following labs and imaging studies  CBC:  Recent Labs Lab 06/04/16 1411 06/05/16 [id number]  WBC 14.6* 11.6*  HGB 9.9* 9.3*  HCT 30.6* 28.5*  MCV 92.7 91.6  PLT 214 [id number]   Basic Metabolic Panel:  Recent Labs Lab 06/04/16 1411 06/05/16 0627  NA 137 135  K 3.8 3.7  CL 103 101  CO2 28 27  GLUCOSE 89 82  BUN 31* 24*  CREATININE 1.47* 1.11  CALCIUM 8.6* 8.3*   GFR: Estimated Creatinine Clearance: 46.5 mL/min (by C-G formula based on SCr of 1.11 mg/dL). Liver Function Tests:  Recent Labs Lab 06/04/16 1411  AST 13*  ALT 9*  ALKPHOS 59  BILITOT 0.7  PROT 6.1*  ALBUMIN 2.4*    Recent Labs Lab 06/04/16 1411  LIPASE 14   No results for input(s): AMMONIA in the last 168 hours. Coagulation Profile: No results for input(s): INR, PROTIME in the last 168  hours. Cardiac Enzymes: No results for input(s): CKTOTAL, CKMB, CKMBINDEX, TROPONINI in the last 168 hours. BNP (last 3 results) No results for input(s): PROBNP in the last 8760 hours. HbA1C: No results for input(s): HGBA1C in the last 72 hours. CBG: No results for input(s): GLUCAP in the last 168 hours. Lipid Profile: No results for input(s): CHOL, HDL, LDLCALC, TRIG, CHOLHDL, LDLDIRECT in the last 72 hours. Thyroid Function Tests: No results for input(s): TSH, T4TOTAL, FREET4, T3FREE, THYROIDAB in the last 72 hours. Anemia Panel: No results for input(s): VITAMINB12, FOLATE, FERRITIN, TIBC, IRON, RETICCTPCT in the last 72 hours. Urine analysis:    Component Value Date/Time   COLORURINE YELLOW 06/04/2016 1335   APPEARANCEUR TURBID (A) 06/04/2016 1335   LABSPEC 1.015 06/04/2016 1335   PHURINE 6.0 06/04/2016 1335   GLUCOSEU NEGATIVE 06/04/2016 1335   HGBUR SMALL (A) 06/04/2016 1335   BILIRUBINUR NEGATIVE 06/04/2016 1335   KETONESUR NEGATIVE 06/04/2016 1335   PROTEINUR 100 (A) 06/04/2016 1335   UROBILINOGEN 0.2 03/04/2015 1840   NITRITE NEGATIVE 06/04/2016 1335   LEUKOCYTESUR LARGE (A) 06/04/2016 1335   Sepsis Labs: @LABRCNTIP (procalcitonin:4,lacticidven:4)  ) Recent Results (from the past 240 hour(s))  Culture, blood (routine x 2)     Status: None (Preliminary result)   Collection Time: 06/04/16  7:01 PM  Result Value Ref Range Status   Specimen Description BLOOD RIGHT WRIST  Final   Special Requests BOTTLES DRAWN AEROBIC AND ANAEROBIC 6CC EACH  Final   Culture NO GROWTH < 24 HOURS  Final   Report Status PENDING  Incomplete  MRSA PCR Screening     Status: None   Collection Time: 06/04/16  8:54 PM  Result Value Ref Range Status   MRSA by PCR NEGATIVE NEGATIVE Final    Comment:        The GeneXpert MRSA Assay (FDA approved for NASAL specimens only), is one component of a comprehensive MRSA colonization surveillance program. It is not intended to diagnose  MRSA infection nor to guide or monitor treatment for MRSA infections.   Culture, blood (routine x 2)     Status: None (Preliminary result)   Collection Time: 06/04/16 10:26 PM  Result Value Ref Range Status   Specimen Description RIGHT ANTECUBITAL  Final   Special Requests BOTTLES DRAWN AEROBIC AND ANAEROBIC 6CC EACH  Final   Culture NO GROWTH < 24 HOURS  Final   Report Status PENDING  Incomplete         Radiology Studies: Dg Chest 2 View  Result Date: 06/04/2016 CLINICAL DATA:  83 year old with current history of Alzheimer's dementia, presenting to the emergency room with fatigue and generalized weakness. EXAM: CHEST  2 VIEW COMPARISON:  02/13/2016, 11/29/2015 and earlier. FINDINGS: AP erect and lateral images were obtained. Cardiac silhouette mildly enlarged, unchanged. Thoracic aorta atherosclerotic, unchanged. Apparent superior mediastinal widening is felt to be due to mediastinal fat and AP technique. Respiratory motion blurred the lateral image. Consolidation in the right lower lobe. Lungs otherwise clear. Pulmonary vascularity normal. No visible pleural effusions. Severe T11 compression fracture with an associated kyphous deformity as noted previously. IMPRESSION: 1. Right lower lobe atelectasis and/or pneumonia. 2. Stable mild cardiomegaly without pulmonary edema. Electronically Signed   By: Evangeline Dakin M.D.   On: 06/04/2016 15:33   Ct Head Wo Contrast  Result Date: 06/04/2016 CLINICAL DATA:  Altered mental status. Fatigue. Alzheimer's. No known injury. EXAM: CT HEAD WITHOUT CONTRAST TECHNIQUE: Contiguous axial images were obtained from the base of the skull through the vertex without intravenous contrast. COMPARISON:  05/12/2016 FINDINGS: Brain: There is moderate central and cortical atrophy. Periventricular white matter changes are consistent with small vessel disease. Vascular: There is significant atherosclerotic calcification of the internal carotid arteries. Skull: No  acute calvarial abnormality. Sinuses/Orbits: No acute finding. Other: Subacute fracture of the nasal bones. IMPRESSION: 1.  No evidence for acute intracranial abnormality. 2. Significant atrophy and small vessel disease. 3. Subacute fracture of the nasal bones Electronically Signed   By: Nolon Nations M.D.   On: 06/04/2016 15:49        Scheduled Meds: . aspirin  325 mg Oral Q breakfast  . divalproex  250 mg Oral TID  . enoxaparin (LOVENOX) injection  40 mg Subcutaneous Q24H  . feeding supplement (ENSURE ENLIVE)  237 mL Oral TID WC  . fluticasone  1 spray Each Nare Daily  . haloperidol  0.5 mg Oral Daily  . meropenem (MERREM) IV  1 g Intravenous Q12H  . midodrine  2.5 mg Oral TID WC  . pantoprazole  40 mg Oral Daily  . senna  1 tablet Oral QHS  . sertraline  100 mg Oral Daily   Continuous Infusions: . dextrose 5 % and 0.45% NaCl 100 mL/hr at 06/04/16 1858     LOS: 0 days    Time spent: 25 minutes. Greater than 50% of this time was spent in direct contact with the patient coordinating care.     Lelon Frohlich, MD [facility] Pager [phone]  If 7PM-7AM, please contact night-coverage [email or web address] Password [facility] 06/05/2016, 5:37 PM

## 2016-06-05 NOTE — Care Management Obs Status (Signed)
[place] NOTIFICATION   Patient Details  Name: David Collier MRN: [id number] Date of Birth: 1928/09/28   Medicare Observation Status Notification Given:  Yes    Sherald Barge, RN 06/05/2016, 2:04 PM

## 2016-06-05 NOTE — Progress Notes (Signed)
Initial Nutrition Assessment  DOCUMENTATION CODES:   Not applicable  INTERVENTION:  Provide Ensure Enlive TID with meals and Magic Cup TID between meals. Each Ensure Enlive supplement provides 350 kcal and 20 grams of protein. Each [employer] supplement provides 290 kcal and 9 grams of protein.  NUTRITION DIAGNOSIS:   Inadequate oral intake related to chronic illness as evidenced by percent weight loss, per patient/family report.  GOAL:   Patient will meet greater than or equal to 90% of their needs   MONITOR:   Supplement acceptance, Labs, Weight trends, PO intake  REASON FOR ASSESSMENT:   Consult Assessment of nutrition requirement/status  ASSESSMENT:   83 yo male with advanced Alzheimer's Dementia who lives at SNF, brought in for evaluation of fatigue and weakness. PMHx incldues CAD, CKD Stage III, Degenerative joint disease, stroke, HTN, hyperlipidemia.  Patient was asleep in room during consult and family was at bedside. Daughter provided nutrition hx during consult. Per daughter, patient is on fully mechanical diet at SNF. Daughter reported patient ate nothing by mouth yesterday, and so far had only eaten a few bites of jello and a few sips of sprite this AM . She reported patient has eaten basically nothing for the past 3 days. PTA and 3 days of no PO intake, daughter reported appetite was better.  Daughter reported concern regarding weight loss. She estimated he had lost 75 pounds over the past few months, (according to chart patient has lost 12 lb,7.5 % weight loss over the past month and 33 lb- 24% weight loss over the past 6 months), both are significant for this time frame.  Daughter reported patient had tried Ensure at [facility] and seemed to like this supplement. Patient's daughter agreed this would be a good intervention TID with meals as well as magic cup TID BM in order to supplement current intake with extra protein and calories.  Labs Reviewed: BUN 24, Calcium  8.3  Meds Reviewed: Protonix, Senokot  Diet Order:  Diet regular Room service appropriate? Yes; Fluid consistency: Thin  Skin:  Reviewed, no issues  Last BM:  PTA  Height:   Ht Readings from Last 1 Encounters:  06/04/16 5\' 8"  (1.727 m)    Weight:   Wt Readings from Last 1 Encounters:  06/04/16 148 lb 11.2 oz (67.4 kg)    Ideal Body Weight:  70 kg  BMI:  Body mass index is 22.61 kg/m.  Estimated Nutritional Needs:   Kcal:  2000-2300  Protein:  52-62 grams/day  Fluid:  >/2 L/day  EDUCATION NEEDS:   No education needs identified at this time  Juliann Pulse M.S. Nutrition Dietetic Intern

## 2016-06-05 NOTE — Clinical Social Work Note (Signed)
Clinical Social Work Assessment  Patient Details  Name: NASIAH LEHENBAUER MRN: [id number] Date of Birth: Sep 18, 1928  Date of referral:  06/05/16               Reason for consult:  Discharge Planning                Permission sought to share information with:  Facility Art therapist granted to share information::     Name::        Agency::     Relationship::     Contact Information:     Housing/Transportation Living arrangements for the past 2 months:  [place] of Information:  Facility, Adult Children Patient Interpreter Needed:  None Criminal Activity/Legal Involvement Pertinent to Current Situation/Hospitalization:  No - Comment as needed Significant Relationships:  Adult Children, Other Family Members Lives with:  Facility Resident Do you feel safe going back to the place where you live?  Yes Need for family participation in patient care:  Yes (Comment)  Care giving concerns:  Pt is [facility]    Social Worker assessment / plan:  CSW met with pt's daughter Otila Kluver and granddaughter Dawn at bedside. Pt sleeping during assessment. Dawn called her aunt, Glenard Haring on speaker phone as she is Economist. Verified no guardian and CSW updated this information in chart. Pt has been a resident at [facility] since June 2017. Family visits daily and is very involved. Pt has advanced dementia. Per Thereasa Solo at facility, pt is nursing level of care and okay to return.   Employment status:  Retired Nurse, adult PT Recommendations:  Not assessed at this time Information / Referral to community resources:  Other (Comment Required) (Return to [facility])  Patient/Family's Response to care:  Pt's family requests return to [facility] when medically stable.   Patient/Family's Understanding of and Emotional Response to Diagnosis, Current Treatment, and Prognosis:  Pt's family feel that they made best decision to send pt to ED from  [place] yesterday. Waiting to see MD today.   Emotional Assessment Appearance:  Appears stated age Attitude/Demeanor/Rapport:  Unable to Assess Affect (typically observed):  Unable to Assess Orientation:    Alcohol / Substance use:  Not Applicable Psych involvement (Current and /or in the community):  No (Comment)  Discharge Needs  Concerns to be addressed:  Discharge Planning Concerns Readmission within the last 30 days:  No Current discharge risk:  None Barriers to Discharge:  Continued Medical Work up   [employer], [employer], [place] 06/05/2016, 11:03 AM [phone]

## 2016-06-05 NOTE — NC FL2 (Signed)
[place] LEVEL OF CARE SCREENING TOOL     IDENTIFICATION  Patient Name: David Collier Birthdate: 1929/02/21 Sex: male Admission Date (Current Location): 06/04/2016  [place] and [place] Number:  [place] [id number] [place] and Address:  [facility] [address] [place]      Provider Number: [id number]  Attending Physician Name and Address:  Koleen Nimrod Acost*  Relative Name and Phone Number:       Current Level of Care: Hospital Recommended Level of Care: [place] Prior Approval Number:    Date Approved/Denied:   PASRR Number: [id number]  Discharge Plan: SNF    Current Diagnoses: Patient Active Problem List   Diagnosis Date Noted  . Encephalopathy 06/04/2016  . Severe protein-calorie malnutrition ([place]) 06/04/2016  . Anemia 06/04/2016  . Bacteremia 10/08/2015  . Sepsis ([place]) 10/06/2015  . Lower urinary tract infectious disease 10/06/2015  . Acute renal failure superimposed on stage 3 chronic kidney disease ([place]) 10/06/2015  . Acute blood loss anemia 09/18/2015  . Closed left hip fracture ([place]) 09/17/2015  . Trochanteric fracture of right femur ([place]) 06/24/2015  . Community acquired pneumonia 06/24/2015  . Hip fracture ([address]) 06/24/2015  . Obesity (BMI 30-39.9) 07/11/2014  . Dementia 07/11/2014  . Unstable angina pectoris ([place]) 07/11/2014  . 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 CKD (chronic kidney disease), stage III 03/05/2014  . Macular degeneration (senile) of retina 02/16/2009  . Essential hypertension 02/16/2009  . Hyperlipidemia   . CAD (coronary artery disease), native coronary artery     Orientation RESPIRATION BLADDER Height & Weight     Self  Normal Incontinent Weight: 148 lb 11.2 oz (67.4 kg) Height:  5\' 8"  (172.7 cm)  BEHAVIORAL SYMPTOMS/MOOD  NEUROLOGICAL BOWEL NUTRITION STATUS    Convulsions/Seizures (history) Incontinent Diet (Regular. See d/c summary for updates.)  AMBULATORY STATUS COMMUNICATION OF NEEDS Skin   Total Care Verbally Normal                       Personal Care Assistance Level of Assistance  Bathing, Feeding, Dressing Bathing Assistance: Maximum assistance Feeding assistance: Maximum assistance Dressing Assistance: Maximum assistance     Functional Limitations Info  Sight, Hearing, Speech Sight Info: Adequate Hearing Info: Impaired Speech Info: Adequate    SPECIAL CARE FACTORS FREQUENCY                       Contractures      Additional Factors Info  Psychotropic Code Status Info: DNR Allergies Info: Aricept (Donepezil Hcl), Ativan (Lorazepam), Atorvastatin, Namenda (Memantine), Other, Simvastatin, Sorbitan Psychotropic Info: Depakote, Haldol   Isolation Precautions Info: 10/06/15 Blood [place] = E. Coli ESBL (cj)     Current Medications (06/05/2016):  This is the current hospital active medication list Current Facility-Administered Medications  Medication Dose Route Frequency Provider Last Rate Last Dose  . acetaminophen (TYLENOL) tablet 650 mg  650 mg Oral Q6H PRN Karmen Bongo, MD       Or  . acetaminophen (TYLENOL) suppository 650 mg  650 mg Rectal Q6H PRN Karmen Bongo, MD      . albuterol (PROVENTIL) (2.5 MG/3ML) 0.083% nebulizer solution 2.5 mg  2.5 mg Nebulization Q6H PRN Karmen Bongo, MD      . aspirin EC tablet 325 mg  325 mg Oral Q breakfast Karmen Bongo, MD   325 mg  at 06/05/16 0824  . dextrose 5 %-0.45 % sodium chloride infusion   Intravenous Continuous Margette Fast, MD 100 mL/hr at 06/04/16 1858    . divalproex (DEPAKOTE SPRINKLE) capsule 250 mg  250 mg Oral TID Karmen Bongo, MD   250 mg at 06/05/16 1023  . enoxaparin (LOVENOX) injection 40 mg  40 mg Subcutaneous Q24H Karmen Bongo, MD   40 mg at 06/04/16 2237  . fluticasone (FLONASE) 50 MCG/ACT nasal spray 1  spray  1 spray Each Nare Daily Karmen Bongo, MD   1 spray at 06/05/16 1025  . haloperidol (HALDOL) tablet 0.5 mg  0.5 mg Oral Daily Karmen Bongo, MD   0.5 mg at 06/05/16 1024  . HYDROcodone-acetaminophen (NORCO/VICODIN) 5-325 MG per tablet 1 tablet  1 tablet Oral Q6H PRN Karmen Bongo, MD      . meropenem [facility]) 1 g in sodium chloride 0.9 % 100 mL IVPB  1 g Intravenous Q12H Karmen Bongo, MD   1 g at 06/04/16 2237  . midodrine (PROAMATINE) tablet 2.5 mg  2.5 mg Oral TID WC Karmen Bongo, MD   2.5 mg at 06/05/16 0205  . ondansetron (ZOFRAN) tablet 4 mg  4 mg Oral Q6H PRN Karmen Bongo, MD       Or  . ondansetron [facility]) injection 4 mg  4 mg Intravenous Q6H PRN Karmen Bongo, MD      . pantoprazole (PROTONIX) EC tablet 40 mg  40 mg Oral Daily Erline Hau, MD   40 mg at 06/05/16 1025  . senna (SENOKOT) tablet 8.6 mg  1 tablet Oral QHS Karmen Bongo, MD   8.6 mg at 06/04/16 2237  . sertraline (ZOLOFT) tablet 100 mg  100 mg Oral Daily Karmen Bongo, MD   100 mg at 06/05/16 1023     Discharge Medications: Please see discharge summary for a list of discharge medications.  Relevant Imaging Results:  Relevant Lab Results:   Additional Information    Salome Arnt, [place]

## 2016-06-06 DIAGNOSIS — N390 Urinary tract infection, site not specified: Principal | ICD-10-CM

## 2016-06-06 LAB — CBC
HEMATOCRIT: 29.3 % — AB (ref 39.0–52.0)
Hemoglobin: 9.6 g/dL — ABNORMAL LOW (ref 13.0–17.0)
MCH: 29.9 pg (ref 26.0–34.0)
MCHC: 32.8 g/dL (ref 30.0–36.0)
MCV: 91.3 fL (ref 78.0–100.0)
Platelets: 271 10*3/uL (ref 150–400)
RBC: 3.21 MIL/uL — AB (ref 4.22–5.81)
RDW: 15.8 % — ABNORMAL HIGH (ref 11.5–15.5)
WBC: 11 10*3/uL — AB (ref 4.0–10.5)

## 2016-06-06 LAB — BASIC METABOLIC PANEL
Anion gap: 8 (ref 5–15)
BUN: 20 mg/dL (ref 6–20)
CHLORIDE: 103 mmol/L (ref 101–111)
CO2: 24 mmol/L (ref 22–32)
Calcium: 8.4 mg/dL — ABNORMAL LOW (ref 8.9–10.3)
Creatinine, Ser: 1.16 mg/dL (ref 0.61–1.24)
GFR calc non Af Amer: 56 mL/min — ABNORMAL LOW (ref >60)
Glucose, Bld: 83 mg/dL (ref 65–99)
POTASSIUM: 3.9 mmol/L (ref 3.5–5.1)
SODIUM: 135 mmol/L (ref 135–145)

## 2016-06-06 MED ORDER — VANCOMYCIN HCL 500 MG IV SOLR
500.0000 mg | Freq: Once | INTRAVENOUS | Status: AC
  Administered 2016-06-06: 500 mg via INTRAVENOUS
  Filled 2016-06-06: qty 500

## 2016-06-06 MED ORDER — VANCOMYCIN HCL 500 MG IV SOLR
INTRAVENOUS | Status: AC
  Filled 2016-06-06: qty 500

## 2016-06-06 MED ORDER — VANCOMYCIN HCL IN DEXTROSE 750-5 MG/150ML-% IV SOLN
750.0000 mg | Freq: Once | INTRAVENOUS | Status: AC
  Administered 2016-06-06: 750 mg via INTRAVENOUS
  Filled 2016-06-06: qty 150

## 2016-06-06 MED ORDER — VANCOMYCIN HCL 500 MG IV SOLR
500.0000 mg | Freq: Two times a day (BID) | INTRAVENOUS | Status: DC
  Administered 2016-06-06 – 2016-06-07 (×2): 500 mg via INTRAVENOUS
  Filled 2016-06-06 (×4): qty 500

## 2016-06-06 NOTE — Progress Notes (Signed)
Patient has been very drowsy throughout shift.  Daughter states he seems to have nights and days mixed.  Patient usually sleeping when entering room, respond to verbal cues but then falls asleep again shortly after.  Poor PO intake. Takes a lot of encouragement and cueing to take meds in AS,.  Will continue to monitor.

## 2016-06-06 NOTE — Progress Notes (Signed)
Pharmacy Antibiotic Note  David Collier is a 83 y.o. male admitted on 06/04/2016 with UTI / bacteremia.  Pharmacy has been consulted for Meropenem and Vancomycin dosing.  Plan: Meropenem 1gm IV every 12 hours. Vancomycin 500mg  IV q12hrs  Monitor labs, micro and vitals.   Height: 5\' 8"  (172.7 cm) Weight: 148 lb 11.2 oz (67.4 kg) IBW/kg (Calculated) : 68.4  Temp (24hrs), Avg:97.9 F (36.6 C), Min:97.5 F (36.4 C), Max:98.2 F (36.8 C)   Recent Labs Lab 06/04/16 1411 06/04/16 1526 06/05/16 0627 06/06/16 0454  WBC 14.6*  --  11.6* 11.0*  CREATININE 1.47*  --  1.11 1.16  LATICACIDVEN  --  0.72  --   --     Estimated Creatinine Clearance: 44.5 mL/min (by C-G formula based on SCr of 1.16 mg/dL).    Allergies  Allergen Reactions  . Aricept [Donepezil Hcl] Other (See Comments)    Altered mental status  . Ativan [Lorazepam] Other (See Comments)    Altered mental status  . Atorvastatin Nausea And Vomiting and Other (See Comments)    Joint pain  . Namenda [Memantine] Other (See Comments)    Altered mental status  . Other     Wife says pt was given 2 different medications for his nerves but said they made him have SI.  Marland Kitchen Simvastatin Nausea And Vomiting and Other (See Comments)    Joint pain  . Sorbitan Nausea And Vomiting    headache   Antimicrobials this admission: Meropnem 2/12 >>  Vancomycin 2/14 >>  Dose adjustments this admission: n/a   Recent Results (from the past 240 hour(s))  Culture, blood (routine x 2)     Status: None (Preliminary result)   Collection Time: 06/04/16  7:01 PM  Result Value Ref Range Status   Specimen Description BLOOD RIGHT WRIST  Final   Special Requests BOTTLES DRAWN AEROBIC AND ANAEROBIC 6CC EACH  Final   Culture  Setup Time   Final    GRAM POSITIVE COCCI IN CLUSTERS AEB BOTTLE AT [facility] Gram Stain Report Called to,Read Back By and Verified With: DANIEL C AT [place] ON [id number] BY FORSYTH K    Culture GRAM POSITIVE COCCI  Final   Report  Status PENDING  Incomplete  MRSA PCR Screening     Status: None   Collection Time: 06/04/16  8:54 PM  Result Value Ref Range Status   MRSA by PCR NEGATIVE NEGATIVE Final    Comment:        The GeneXpert MRSA Assay (FDA approved for NASAL specimens only), is one component of a comprehensive MRSA colonization surveillance program. It is not intended to diagnose MRSA infection nor to guide or monitor treatment for MRSA infections.   Culture, blood (routine x 2)     Status: None (Preliminary result)   Collection Time: 06/04/16 10:26 PM  Result Value Ref Range Status   Specimen Description RIGHT ANTECUBITAL  Final   Special Requests BOTTLES DRAWN AEROBIC AND ANAEROBIC 6CC EACH  Final   Culture NO GROWTH < 24 HOURS  Final   Report Status PENDING  Incomplete   Thank you for allowing pharmacy to be a part of this patient's care.  Hart Robinsons A 06/06/2016 7:36 AM

## 2016-06-06 NOTE — Progress Notes (Signed)
ANTIBIOTIC CONSULT NOTE-Preliminary  Pharmacy Consult for vancomycin Indication: bacteremia  Allergies  Allergen Reactions  . Aricept [Donepezil Hcl] Other (See Comments)    Altered mental status  . Ativan [Lorazepam] Other (See Comments)    Altered mental status  . Atorvastatin Nausea And Vomiting and Other (See Comments)    Joint pain  . Namenda [Memantine] Other (See Comments)    Altered mental status  . Other     Wife says pt was given 2 different medications for his nerves but said they made him have SI.  Marland Kitchen Simvastatin Nausea And Vomiting and Other (See Comments)    Joint pain  . Sorbitan Nausea And Vomiting    headache    Patient Measurements: Height: 5\' 8"  (172.7 cm) Weight: 148 lb 11.2 oz (67.4 kg) IBW/kg (Calculated) : 68.4 Adjusted Body Weight:   Vital Signs: Temp: 98.2 F (36.8 C) (02/13 2300) Temp Source: Oral (02/13 2300) BP: 117/68 (02/13 2300) Pulse Rate: 87 (02/13 2300)  Labs:  Recent Labs  06/04/16 1411 06/05/16 0627  WBC 14.6* 11.6*  HGB 9.9* 9.3*  PLT 214 229  CREATININE 1.47* 1.11    Estimated Creatinine Clearance: 46.5 mL/min (by C-G formula based on SCr of 1.11 mg/dL).  No results for input(s): VANCOTROUGH, VANCOPEAK, VANCORANDOM, GENTTROUGH, GENTPEAK, GENTRANDOM, TOBRATROUGH, TOBRAPEAK, TOBRARND, AMIKACINPEAK, AMIKACINTROU, AMIKACIN in the last 72 hours.   Microbiology: Recent Results (from the past 720 hour(s))  Culture, blood (routine x 2)     Status: None (Preliminary result)   Collection Time: 06/04/16  7:01 PM  Result Value Ref Range Status   Specimen Description BLOOD RIGHT WRIST  Final   Special Requests BOTTLES DRAWN AEROBIC AND ANAEROBIC 6CC EACH  Final   Culture  Setup Time   Final    GRAM POSITIVE COCCI IN CLUSTERS AEB BOTTLE AT [facility] Gram Stain Report Called to,Read Back By and Verified With: DANIEL C AT [place] ON OH:[id number] BY FORSYTH K    Culture NO GROWTH < 24 HOURS  Final   Report Status PENDING  Incomplete  MRSA PCR  Screening     Status: None   Collection Time: 06/04/16  8:54 PM  Result Value Ref Range Status   MRSA by PCR NEGATIVE NEGATIVE Final    Comment:        The GeneXpert MRSA Assay (FDA approved for NASAL specimens only), is one component of a comprehensive MRSA colonization surveillance program. It is not intended to diagnose MRSA infection nor to guide or monitor treatment for MRSA infections.   Culture, blood (routine x 2)     Status: None (Preliminary result)   Collection Time: 06/04/16 10:26 PM  Result Value Ref Range Status   Specimen Description RIGHT ANTECUBITAL  Final   Special Requests BOTTLES DRAWN AEROBIC AND ANAEROBIC 6CC EACH  Final   Culture NO GROWTH < 24 HOURS  Final   Report Status PENDING  Incomplete    Medical History: Past Medical History:  Diagnosis Date  . Alzheimer's dementia   . Anginal pain (HCC)    NONE IN 1 MONTH   . 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 CAD (coronary artery disease), native coronary artery    Initially diagnosed 2003 2006 catheterization with bare metal stenting of the first marginal branch of the circumflex and continuation branch of the circumflex 2010 catheterization showing normal left main, 50-60% LAD unchanged from before, proximal stent of marginal patent, severe in-stent restenosis treated with cutting balloon angioplasty, patent stent in continuation branch with mild  in-stent restenosis, occluded nondominant right coronary artery.   . CKD (chronic kidney disease), stage III   . Coronary atherosclerosis of native coronary artery    BMS circumflex and OM 2006, PTCA ISR 2010, moderate LAD disease  . Depression   . DJD (degenerative joint disease)   . Headache   . History of stroke    Seen radiographically-left PICA; unknown to patient.  . Hyperlipidemia   . Hypertension   . Macular degeneration   . Myocardial infarction    x5    1980s  sees DR. MICHAEL COOPER  . Seizures [facility])    family is uncertain he has  these  . TIA (transient ischemic attack) 03/05/2014    Medications:  Prescriptions Prior to Admission  Medication Sig Dispense Refill Last Dose  . acetaminophen (TYLENOL) 325 MG tablet Take 650 mg by mouth every 6 (six) hours as needed for mild pain or fever.   unknown  . albuterol (PROVENTIL) (2.5 MG/3ML) 0.083% nebulizer solution Take 2.5 mg by nebulization every 6 (six) hours as needed for wheezing or shortness of breath.   unknown  . aspirin EC 325 MG EC tablet Take 1 tablet (325 mg total) by mouth daily with breakfast. 30 tablet 0 06/04/2016 at Unknown time  . cyanocobalamin 500 MCG tablet Take 500 mcg by mouth daily.   06/04/2016 at Unknown time  . divalproex (DEPAKOTE SPRINKLE) 125 MG capsule Take 250 mg by mouth 3 (three) times daily.    06/04/2016 at Unknown time  . fluticasone (FLONASE) 50 MCG/ACT nasal spray Place 1 spray into both nostrils daily.   06/04/2016 at Unknown time  . haloperidol (HALDOL) 0.5 MG tablet Take 0.5 mg by mouth daily.   06/03/2016 at Unknown time  . HYDROcodone-acetaminophen (NORCO) 5-325 MG tablet Take 1 tablet by mouth every 6 (six) hours as needed for moderate pain. 40 tablet 0 06/04/2016 at Unknown time  . iron polysaccharides (NIFEREX) 150 MG capsule Take 150 mg by mouth daily.   06/04/2016 at Unknown time  . Melatonin 3 MG CAPS Take 3 mg by mouth at bedtime.   06/03/2016 at Unknown time  . midodrine (PROAMATINE) 2.5 MG tablet Take 2.5 mg by mouth 3 (three) times daily with meals.   06/04/2016 at Unknown time  . NITROSTAT 0.4 MG SL tablet DISSOLVE ONE TABLET UNDER THE TONGUE EVERY 5 MINUTES AS NEEDED FOR CHEST PAIN 25 tablet 3 unknown  . pantoprazole (PROTONIX) 20 MG tablet Take 1 tablet (20 mg total) by mouth daily. 30 tablet 0 06/04/2016 at Unknown time  . senna (SENOKOT) 8.6 MG TABS tablet Take 1 tablet by mouth at bedtime.   06/03/2016 at Unknown time  . sertraline (ZOLOFT) 100 MG tablet Take 100 mg by mouth daily.   06/03/2016 at Unknown time  . traZODone  (DESYREL) 50 MG tablet Take 50 mg by mouth at bedtime.    06/03/2016 at Unknown time   Scheduled:  . aspirin  325 mg Oral Q breakfast  . divalproex  250 mg Oral TID  . enoxaparin (LOVENOX) injection  40 mg Subcutaneous Q24H  . feeding supplement (ENSURE ENLIVE)  237 mL Oral TID WC  . fluticasone  1 spray Each Nare Daily  . haloperidol  0.5 mg Oral Daily  . meropenem (MERREM) IV  1 g Intravenous Q12H  . midodrine  2.5 mg Oral TID WC  . pantoprazole  40 mg Oral Daily  . senna  1 tablet Oral QHS  . sertraline  100 mg Oral  Daily  . vancomycin  500 mg Intravenous Once   Infusions:  . dextrose 5 % and 0.45% NaCl 100 mL/hr at 06/04/16 1858   PRN: acetaminophen **OR** acetaminophen, albuterol, HYDROcodone-acetaminophen, ondansetron **OR** ondansetron (ZOFRAN) IV Anti-infectives    Start     Dose/Rate Route Frequency Ordered Stop   06/06/16 0530  vancomycin (VANCOCIN) 500 mg in sodium chloride 0.9 % 100 mL IVPB     500 mg 100 mL/hr over 60 Minutes Intravenous  Once 06/06/16 0523     06/04/16 2200  meropenem (MERREM) 1 g in sodium chloride 0.9 % 100 mL IVPB     1 g 200 mL/hr over 30 Minutes Intravenous Every 12 hours 06/04/16 2052     06/04/16 1830  cefTRIAXone (ROCEPHIN) 1 g in dextrose 5 % 50 mL IVPB     1 g 100 mL/hr over 30 Minutes Intravenous  Once 06/04/16 1823 06/04/16 2010      Assessment: 83 yo male with dementia admitted for encephalopathy and UTI.   Goal of Therapy:  Vancomycin trough level 15-20 mcg/ml  Plan:  Preliminary review of pertinent patient information completed.  Protocol will be initiated with a one-time dose(s) of vancomycin 500mg  once.  Forestine Na clinical pharmacist will complete review during morning rounds to assess patient and finalize treatment regimen.  Nyra Capes, [facility] 06/06/2016,5:23 AM

## 2016-06-06 NOTE — Progress Notes (Signed)
[facility] PROGRESS NOTE  David Collier [id number] DOB: 1929-02-09 DOA: 06/04/2016 PCP: Renata Caprice, DO   Principal Problem:   Encephalopathy Active Problems:   CKD (chronic kidney disease), stage III   Dementia   Lower urinary tract infectious disease   Severe protein-calorie malnutrition ([place])   Anemia   Brief summary  83 y.o. male with medical history significant of TIA, CAD, HTN, HLD, CKD, advanced dementia, and hospitalization for sepsis from a urinary source with ESBL organism in 6/17 presenting with AMS.  Since Saturday evening, he has been sleeping all the time.  This is generally the first sign of him having a UTI.  Yesterday, he became lethargic like he was when he was septic last June - wouldn't respond, wouldn't drink.  They spoke with the nurse at [facility] and asked for evaluation for UTI.  Daughter called in today to check and then came to see him and he is even worse than he was.  They requested for him to be transferred to the ER.  UA was done yesterday PM, blood work was this AM and then they did more this afternoon; none of these results were back.  They were waiting for the doctor or NP to determine if he needed to come to the ER and the family got frustrtated and decided to call 911.  No fevers.    Has advanced dementia.  At baseline, wheels himself around in his wheelchair.  Has not recognized family for weeks to months.  Occasionally able to have a limited conversation.  Persistent unintentional weight loss.   Assessment/Plan:   Acute Encephalopathy. Thought Due to UTI on top of baseline severe dementia. Neuro exam is non focal. CT head: +atrophy. Mental status is improved, but per family, still not back to baseline.  UTI. blood cultures 1/2 is positive GPC (pend). Urine cultures pend -Due to h/o ESBL E Coli in 6/17 has been placed on meropenem pending cx data.  Probable pneumonia. improving on iv antibiotics, cultures as above  CKD Stage II-III.Cr is  at baseline of 1.1-1.3.  -discussed with family that due to severe baseline dementia, he may not return to prior level of functioning. May benefit from palliative care evaluation in the near future to further address natural progression of dementia and family coping mechanisms. Code Status: DNR Family Communication: d/w patient, his family, rn (indicate person spoken with, relationship, and if by phone, the number) Disposition Plan: 24-48 hrs   Consultants:   None  Procedures:   None  Antimicrobials:            Anti-infectives    Start     Dose/Rate Route Frequency Ordered Stop   06/04/16 2200  meropenem (MERREM) 1 g in sodium chloride 0.9 % 100 mL IVPB     1 g 200 mL/hr over 30 Minutes Intravenous Every 12 hours 06/04/16 2052     06/04/16 1830  cefTRIAXone (ROCEPHIN) 1 g in dextrose 5 % 50 mL IVPB     1 g 100 mL/hr over 30 Minutes Intravenous  Once 06/04/16 1823 06/04/16 2010          HPI/Subjective: No distress. Confusion at baseline dementia. No focal weakness, able to move all extremities. Afebrile   Objective: Vitals:   06/05/16 2300 06/06/16 0639  BP: 117/68 (!) 113/50  Pulse: 87 70  Resp: 16 15  Temp: 98.2 F (36.8 C) 98 F (36.7 C)    Intake/Output Summary (Last 24 hours) at 06/06/16 1102 Last data filed  at 06/05/16 1900  Gross per 24 hour  Intake              360 ml  Output              550 ml  Net             -190 ml   Filed Weights   06/04/16 1331 06/04/16 2043  Weight: 72.6 kg (160 lb) 67.4 kg (148 lb 11.2 oz)    Exam:   General:  No distress   Cardiovascular: s1,s2 rrr  Respiratory: few rhonchi L  Abdomen: soft, nt, nd   Musculoskeletal: no leg edema    Data Reviewed: Basic Metabolic Panel:  Recent Labs Lab 06/04/16 1411 06/05/16 0627 06/06/16 0454  NA 137 135 135  K 3.8 3.7 3.9  CL 103 101 103  CO2 28 27 24   GLUCOSE 89 82 83  BUN 31* 24* 20  CREATININE 1.47* 1.11 1.16  CALCIUM 8.6* 8.3* 8.4*   Liver  Function Tests:  Recent Labs Lab 06/04/16 1411  AST 13*  ALT 9*  ALKPHOS 59  BILITOT 0.7  PROT 6.1*  ALBUMIN 2.4*    Recent Labs Lab 06/04/16 1411  LIPASE 14   No results for input(s): AMMONIA in the last 168 hours. CBC:  Recent Labs Lab 06/04/16 1411 06/05/16 0627 06/06/16 0454  WBC 14.6* 11.6* 11.0*  HGB 9.9* 9.3* 9.6*  HCT 30.6* 28.5* 29.3*  MCV 92.7 91.6 91.3  PLT 214 229 271   Cardiac Enzymes: No results for input(s): CKTOTAL, CKMB, CKMBINDEX, TROPONINI in the last 168 hours. BNP (last 3 results) No results for input(s): BNP in the last 8760 hours.  ProBNP (last 3 results) No results for input(s): PROBNP in the last 8760 hours.  CBG: No results for input(s): GLUCAP in the last 168 hours.  Recent Results (from the past 240 hour(s))  Culture, blood (routine x 2)     Status: None (Preliminary result)   Collection Time: 06/04/16  7:01 PM  Result Value Ref Range Status   Specimen Description BLOOD RIGHT WRIST  Final   Special Requests BOTTLES DRAWN AEROBIC AND ANAEROBIC 6CC EACH  Final   Culture  Setup Time   Final    GRAM POSITIVE COCCI IN CLUSTERS AEB BOTTLE AT [facility] Gram Stain Report Called to,Read Back By and Verified With: DANIEL C AT [place] ON OH:[id number] BY FORSYTH K    Culture GRAM POSITIVE COCCI  Final   Report Status PENDING  Incomplete  MRSA PCR Screening     Status: None   Collection Time: 06/04/16  8:54 PM  Result Value Ref Range Status   MRSA by PCR NEGATIVE NEGATIVE Final    Comment:        The GeneXpert MRSA Assay (FDA approved for NASAL specimens only), is one component of a comprehensive MRSA colonization surveillance program. It is not intended to diagnose MRSA infection nor to guide or monitor treatment for MRSA infections.   Culture, blood (routine x 2)     Status: None (Preliminary result)   Collection Time: 06/04/16 10:26 PM  Result Value Ref Range Status   Specimen Description RIGHT ANTECUBITAL  Final   Special Requests BOTTLES  DRAWN AEROBIC AND ANAEROBIC [facility] EACH  Final   Culture NO GROWTH 2 DAYS  Final   Report Status PENDING  Incomplete     Studies: Dg Chest 2 View  Result Date: 06/04/2016 CLINICAL DATA:  83 year old with current history of Alzheimer's dementia, presenting  to the emergency room with fatigue and generalized weakness. EXAM: CHEST  2 VIEW COMPARISON:  02/13/2016, 11/29/2015 and earlier. FINDINGS: AP erect and lateral images were obtained. Cardiac silhouette mildly enlarged, unchanged. Thoracic aorta atherosclerotic, unchanged. Apparent superior mediastinal widening is felt to be due to mediastinal fat and AP technique. Respiratory motion blurred the lateral image. Consolidation in the right lower lobe. Lungs otherwise clear. Pulmonary vascularity normal. No visible pleural effusions. Severe T11 compression fracture with an associated kyphous deformity as noted previously. IMPRESSION: 1. Right lower lobe atelectasis and/or pneumonia. 2. Stable mild cardiomegaly without pulmonary edema. Electronically Signed   By: Evangeline Dakin M.D.   On: 06/04/2016 15:33   Ct Head Wo Contrast  Result Date: 06/04/2016 CLINICAL DATA:  Altered mental status. Fatigue. Alzheimer's. No known injury. EXAM: CT HEAD WITHOUT CONTRAST TECHNIQUE: Contiguous axial images were obtained from the base of the skull through the vertex without intravenous contrast. COMPARISON:  05/12/2016 FINDINGS: Brain: There is moderate central and cortical atrophy. Periventricular white matter changes are consistent with small vessel disease. Vascular: There is significant atherosclerotic calcification of the internal carotid arteries. Skull: No acute calvarial abnormality. Sinuses/Orbits: No acute finding. Other: Subacute fracture of the nasal bones. IMPRESSION: 1.  No evidence for acute intracranial abnormality. 2. Significant atrophy and small vessel disease. 3. Subacute fracture of the nasal bones Electronically Signed   By: Nolon Nations M.D.   On:  06/04/2016 15:49    Scheduled Meds: . aspirin  325 mg Oral Q breakfast  . divalproex  250 mg Oral TID  . enoxaparin (LOVENOX) injection  40 mg Subcutaneous Q24H  . feeding supplement (ENSURE ENLIVE)  237 mL Oral TID WC  . fluticasone  1 spray Each Nare Daily  . haloperidol  0.5 mg Oral Daily  . meropenem (MERREM) IV  1 g Intravenous Q12H  . midodrine  2.5 mg Oral TID WC  . pantoprazole  40 mg Oral Daily  . senna  1 tablet Oral QHS  . sertraline  100 mg Oral Daily  . vancomycin  500 mg Intravenous Q12H  . vancomycin  750 mg Intravenous Once   Continuous Infusions: . dextrose 5 % and 0.45% NaCl 100 mL/hr at 06/04/16 1858    Principal Problem:   Encephalopathy Active Problems:   CKD (chronic kidney disease), stage III   Dementia   Lower urinary tract infectious disease   Severe protein-calorie malnutrition ([place])   Anemia   Acute encephalopathy    Time spent: >35 minutes     Kinnie Feil  [facility] Pager [phone]. If 7PM-7AM, please contact night-coverage at [email or web address], password [facility] 06/06/2016, 11:02 AM  LOS: 1 day

## 2016-06-07 DIAGNOSIS — G9340 Encephalopathy, unspecified: Secondary | ICD-10-CM

## 2016-06-07 LAB — CULTURE, BLOOD (ROUTINE X 2)

## 2016-06-07 MED ORDER — VANCOMYCIN HCL IN DEXTROSE 1-5 GM/200ML-% IV SOLN
1000.0000 mg | INTRAVENOUS | Status: DC
  Administered 2016-06-07: 1000 mg via INTRAVENOUS
  Filled 2016-06-07: qty 200

## 2016-06-07 NOTE — Progress Notes (Signed)
[facility] PROGRESS NOTE  David Collier [id number] DOB: [birth year] DOA: 06/04/2016 PCP: Renata Caprice, DO   Principal Problem:   Encephalopathy Active Problems:   CKD (chronic kidney disease), stage III   Dementia   Lower urinary tract infectious disease   Severe protein-calorie malnutrition ([place])   Anemia   Brief summary  83 y.o. male with medical history significant of TIA, CAD, HTN, HLD, CKD, advanced dementia, and hospitalization for sepsis from a urinary source with ESBL organism in 6/17 presenting with AMS.  Since Saturday evening, he has been sleeping all the time.  This is generally the first sign of him having a UTI.  Yesterday, he became lethargic like he was when he was septic last June - wouldn't respond, wouldn't drink.  They spoke with the nurse at [facility] and asked for evaluation for UTI.  Daughter called in today to check and then came to see him and he is even worse than he was.  They requested for him to be transferred to the ER.  UA was done yesterday PM, blood work was this AM and then they did more this afternoon; none of these results were back.  They were waiting for the doctor or NP to determine if he needed to come to the ER and the family got frustrtated and decided to call 911.  No fevers.    Has advanced dementia.  At baseline, wheels himself around in his wheelchair.  Has not recognized family for weeks to months.  Occasionally able to have a limited conversation.  Persistent unintentional weight loss.   Assessment/Plan:   Acute Encephalopathy. Thought Due to UTI on top of baseline severe dementia. Neuro exam is non focal. CT head: +atrophy. Mental status is improved, but per family, now seems  back to baseline. More alert, only oriented to self, with intermittent agitation  UTI. blood cultures 1/2 is positive GPC (pend). Urine cultures pend -Due to h/o ESBL E Coli in 6/17 has been placed on meropenem pending cx data.  Probable pneumonia.  improving on iv antibiotics, cultures as above  Speech eval once patient is more alert Aspiration precaution  CKD Stage II-III.Cr is at baseline of 1.1-1.3.  -discussed with family that due to severe baseline dementia, he may not return to prior level of functioning. May benefit from palliative care evaluation in the near future to further address natural progression of dementia and family coping mechanisms.  Code Status: DNR Family Communication: d/w patient, his family, rn Disposition Plan: return to SNF with palliative care  Consultants:   None  Procedures:   None  Antimicrobials:            Anti-infectives    Start     Dose/Rate Route Frequency Ordered Stop   06/04/16 2200  meropenem (MERREM) 1 g in sodium chloride 0.9 % 100 mL IVPB     1 g 200 mL/hr over 30 Minutes Intravenous Every 12 hours 06/04/16 2052     06/04/16 1830  cefTRIAXone (ROCEPHIN) 1 g in dextrose 5 % 50 mL IVPB     1 g 100 mL/hr over 30 Minutes Intravenous  Once 06/04/16 1823 06/04/16 2010          HPI/Subjective: Awake, only oriented to self, intermittent agitation  Afebrile  No able to see on his right side per family  Objective: Vitals:   06/07/16 0633 06/07/16 1400  BP: 139/81 134/71  Pulse: 92 88  Resp: 18 18  Temp: 98.5 F (36.9 C) 98.1 F (  36.7 C)    Intake/Output Summary (Last 24 hours) at 06/07/16 1830 Last data filed at 06/07/16 1300  Gross per 24 hour  Intake              420 ml  Output             1100 ml  Net             -680 ml   Filed Weights   06/04/16 1331 06/04/16 2043  Weight: 72.6 kg (160 lb) 67.4 kg (148 lb 11.2 oz)    Exam:   General:  Frail, only oriented to self, intermittent agitation  Cardiovascular: s1,s2 rrr  Respiratory: few rhonchi L  Abdomen: soft, nt, nd   Musculoskeletal: no leg edema    Data Reviewed: Basic Metabolic Panel:  Recent Labs Lab 06/04/16 1411 06/05/16 0627 06/06/16 0454  NA 137 135 135  K 3.8 3.7 3.9  CL  103 101 103  CO2 28 27 24   GLUCOSE 89 82 83  BUN 31* 24* 20  CREATININE 1.47* 1.11 1.16  CALCIUM 8.6* 8.3* 8.4*   Liver Function Tests:  Recent Labs Lab 06/04/16 1411  AST 13*  ALT 9*  ALKPHOS 59  BILITOT 0.7  PROT 6.1*  ALBUMIN 2.4*    Recent Labs Lab 06/04/16 1411  LIPASE 14   No results for input(s): AMMONIA in the last 168 hours. CBC:  Recent Labs Lab 06/04/16 1411 06/05/16 0627 06/06/16 0454  WBC 14.6* 11.6* 11.0*  HGB 9.9* 9.3* 9.6*  HCT 30.6* 28.5* 29.3*  MCV 92.7 91.6 91.3  PLT 214 229 271   Cardiac Enzymes: No results for input(s): CKTOTAL, CKMB, CKMBINDEX, TROPONINI in the last 168 hours. BNP (last 3 results) No results for input(s): BNP in the last 8760 hours.  ProBNP (last 3 results) No results for input(s): PROBNP in the last 8760 hours.  CBG: No results for input(s): GLUCAP in the last 168 hours.  Recent Results (from the past 240 hour(s))  Culture, Urine     Status: Abnormal (Preliminary result)   Collection Time: 06/04/16  1:35 PM  Result Value Ref Range Status   Specimen Description URINE, RANDOM  Final   Special Requests NONE  Final   Culture (A)  Final    >=100,000 COLONIES/mL ESCHERICHIA COLI REPEATING SUSCEPTIBILITIES Performed at [facility], [address] [place], [place] [address]    Report Status PENDING  Incomplete  Culture, blood (routine x 2)     Status: Abnormal   Collection Time: 06/04/16  7:01 PM  Result Value Ref Range Status   Specimen Description BLOOD RIGHT WRIST  Final   Special Requests BOTTLES DRAWN AEROBIC AND ANAEROBIC 6CC EACH  Final   Culture  Setup Time   Final    GRAM POSITIVE COCCI IN CLUSTERS AEB BOTTLE AT [facility] Gram Stain Report Called to,Read Back By and Verified With: DANIEL C AT 0452 ON [id number] BY FORSYTH K    Culture (A)  Final    STAPHYLOCOCCUS CAPITIS THE SIGNIFICANCE OF ISOLATING THIS ORGANISM FROM A SINGLE SET OF BLOOD CULTURES WHEN MULTIPLE SETS ARE DRAWN IS UNCERTAIN. PLEASE NOTIFY  THE MICROBIOLOGY DEPARTMENT WITHIN ONE WEEK IF SPECIATION AND SENSITIVITIES ARE REQUIRED. Performed at [facility], [place] [address] [place], [place] [address]    Report Status 06/07/2016 FINAL  Final  MRSA PCR Screening     Status: None   Collection Time: 06/04/16  8:54 PM  Result Value Ref Range Status  MRSA by PCR NEGATIVE NEGATIVE Final    Comment:        The GeneXpert MRSA Assay (FDA approved for NASAL specimens only), is one component of a comprehensive MRSA colonization surveillance program. It is not intended to diagnose MRSA infection nor to guide or monitor treatment for MRSA infections.   Culture, blood (routine x 2)     Status: None (Preliminary result)   Collection Time: 06/04/16 10:26 PM  Result Value Ref Range Status   Specimen Description RIGHT ANTECUBITAL  Final   Special Requests BOTTLES DRAWN AEROBIC AND ANAEROBIC [place]  Final   Culture NO GROWTH 3 DAYS  Final   Report Status PENDING  Incomplete     Studies: No results found.  Scheduled Meds: . aspirin  325 mg Oral Q breakfast  . divalproex  250 mg Oral TID  . enoxaparin (LOVENOX) injection  40 mg Subcutaneous Q24H  . feeding supplement (ENSURE ENLIVE)  237 mL Oral TID WC  . fluticasone  1 spray Each Nare Daily  . haloperidol  0.5 mg Oral Daily  . meropenem (MERREM) IV  1 g Intravenous Q12H  . midodrine  2.5 mg Oral TID WC  . pantoprazole  40 mg Oral Daily  . senna  1 tablet Oral QHS  . sertraline  100 mg Oral Daily  . vancomycin  1,000 mg Intravenous Q24H   Continuous Infusions:   Principal Problem:   Encephalopathy Active Problems:   CKD (chronic kidney disease), stage III   Dementia   Lower urinary tract infectious disease   Severe protein-calorie malnutrition (HCC)   Anemia   Acute encephalopathy    Time spent: >35 minutes     Diany Formosa MD PhD [facility] Pager [phone] If 7PM-7AM, please contact night-coverage at [email or web address], password [facility] 06/07/2016, 6:30 PM   LOS: 2 days

## 2016-06-07 NOTE — Evaluation (Signed)
Clinical/Bedside Swallow Evaluation Patient Details  Name: David Collier MRN: [id number] Date of Birth: 1928-11-16  Today's Date: 06/07/2016 Time: SLP Start Time (ACUTE ONLY): [id number] SLP Stop Time (ACUTE ONLY): 1656 SLP Time Calculation (min) (ACUTE ONLY): 13 min  Past Medical History:  Past Medical History:  Diagnosis Date  . Alzheimer's dementia   . Anginal pain (HCC)    NONE IN 1 MONTH   . 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 CAD (coronary artery disease), native coronary artery    Initially diagnosed 2003 2006 catheterization with bare metal stenting of the first marginal branch of the circumflex and continuation branch of the circumflex 2010 catheterization showing normal left main, 50-60% LAD unchanged from before, proximal stent of marginal patent, severe in-stent restenosis treated with cutting balloon angioplasty, patent stent in continuation branch with mild in-stent restenosis, occluded nondominant right coronary artery.   . CKD (chronic kidney disease), stage III   . Coronary atherosclerosis of native coronary artery    BMS circumflex and OM 2006, PTCA ISR 2010, moderate LAD disease  . Depression   . DJD (degenerative joint disease)   . Headache   . History of stroke    Seen radiographically-left PICA; unknown to patient.  . Hyperlipidemia   . Hypertension   . Macular degeneration   . Myocardial infarction    x5    1980s  sees DR. MICHAEL COOPER  . Seizures [facility])    family is uncertain he has these  . TIA (transient ischemic attack) 03/05/2014   Past Surgical History:  Past Surgical History:  Procedure Laterality Date  . APPENDECTOMY    . CARDIAC CATHETERIZATION     3 STENTS  AT  [place]  80S OR 90S  . CHOLECYSTECTOMY    . CORONARY ANGIOPLASTY    . FEMUR IM NAIL Left 09/17/2015   Procedure: INTRAMEDULLARY (IM) NAIL FEMORAL;  Surgeon: Dorna Leitz, MD;  Location: [place];  Service: Orthopedics;  Laterality: Left;  . INTRAMEDULLARY (IM) NAIL  INTERTROCHANTERIC Right 06/25/2015   Procedure: INTRAMEDULLARY (IM) NAIL INTERTROCHANTRIC;  Surgeon: Dorna Leitz, MD;  Location: [place];  Service: Orthopedics;  Laterality: Right;  . MULTIPLE EXTRACTIONS WITH ALVEOLOPLASTY N/A 10/22/2014   Procedure: MULTIPLE EXTRACTIONS  # 4,6, 7, 8, 10, 21, 22, 23, 24, 25, 26, 27, 28 and ALVEOLOPLASTY;  Surgeon: Diona Browner, DDS;  Location: [place];  Service: Oral Surgery;  Laterality: N/A;   HPI:  David Collier a 83 y.o.malewith medical history significant of TIA, CAD, HTN, HLD, CKD, advanced dementia, and hospitalization for sepsis from a urinary source with ESBL organism in 6/17 presenting with AMS. Since Saturday evening, he has been sleeping all the time. This is generally the first sign of him having a UTI. Yesterday, he became lethargic like he was when he was septic last June - wouldn't respond, wouldn't drink. They spoke with the nurse at [facility] and asked for evaluation for UTI. Daughter called in today to check and thencame to see him and he is even worse than he was. They requested for him to be transferred to the ER.UA was done yesterday PM, blood work was this AM and then they did more this afternoon; none of these results were back. They were waiting for the doctor or NP to determine if he needed to come to the ER and the family got frustrtated and decided to call 911. Pt Has advanced dementia at baseline. BSE ordered; pt currently on D3/mechanical sof and thin liquids although poor PO  intake reported.    Assessment / Plan / Recommendation Clinical Impression  Pt was evaluated at bedside with daughter present. Pt consumed thin liquids and mechanical soft textures presenting with no overt s/sx of aspiration. Family and nursing report pt only has difficulty with oral transit when he is Lethargic; SLP provided extensive education of the importance of only administering PO when pt is alert. According to family, pt tolerates what considered a ground  diet. Recommend downgrade to D2/ground and continue with thin liquids. Meds should be administered crushed in puree. Pt should be seated at 90 degrees and alert for all PO. Follow standard Aspiration precautions. There are no further ST needs noted at this time. ST to sign off.    Aspiration Risk  Mild aspiration risk    Diet Recommendation Dysphagia 2 (Fine chop)   Liquid Administration via: Cup;Straw Medication Administration: Crushed with puree Supervision: Staff to assist with self feeding;Full supervision/cueing for compensatory strategies Compensations: Slow rate;Small sips/bites;Follow solids with liquid;Clear throat intermittently    Other  Recommendations Oral Care Recommendations: Oral care BID   Follow up Recommendations Skilled Nursing facility;24 hour supervision/assistance             Prognosis Prognosis for Safe Diet Advancement: Guarded Barriers to Reach Goals: Cognitive deficits      Swallow Study   General Date of Onset: 06/04/16 HPI: David Collier a 83 y.o.malewith medical history significant of TIA, CAD, HTN, HLD, CKD, advanced dementia, and hospitalization for sepsis from a urinary source with ESBL organism in 6/17 presenting with AMS. Since Saturday evening, he has been sleeping all the time. This is generally the first sign of him having a UTI. Yesterday, he became lethargic like he was when he was septic last June - wouldn't respond, wouldn't drink. They spoke with the nurse at [facility] and asked for evaluation for UTI. Daughter called in today to check and thencame to see him and he is even worse than he was. They requested for him to be transferred to the ER.UA was done yesterday PM, blood work was this AM and then they did more this afternoon; none of these results were back. They were waiting for the doctor or NP to determine if he needed to come to the ER and the family got frustrtated and decided to call 911. Pt Has advanced dementia at  baseline. BSE ordered; pt currently on D3/mechanical sof and thin liquids although poor PO intake reported.  Previous Swallow Assessment: BSE 6/16  Diet Prior to this Study: Dysphagia 3 (soft);Thin liquids Temperature Spikes Noted: No Respiratory Status: Room air History of Recent Intubation: No Behavior/Cognition: Alert;Cooperative;Pleasant mood;Confused Oral Cavity Assessment: Within Functional Limits Oral Cavity - Dentition: Edentulous Vision: Impaired for self-feeding Self-Feeding Abilities: Total assist Patient Positioning: Upright in bed Baseline Vocal Quality: Normal Volitional Cough: Cognitively unable to elicit Volitional Swallow: Unable to elicit    Oral/Motor/Sensory Function     Ice Chips Ice chips: Within functional limits   Thin Liquid Thin Liquid: Within functional limits    Nectar Thick     Honey Thick     Puree     Solid   Janeka Libman H. Roddie Mc, CCC-SLP Speech Language Pathologist    Solid: Within functional limits        Wende Bushy 06/07/2016,5:03 PM

## 2016-06-08 ENCOUNTER — Inpatient Hospital Stay (HOSPITAL_COMMUNITY): Payer: Medicare Other

## 2016-06-08 DIAGNOSIS — D649 Anemia, unspecified: Secondary | ICD-10-CM | POA: Diagnosis not present

## 2016-06-08 DIAGNOSIS — R319 Hematuria, unspecified: Secondary | ICD-10-CM | POA: Diagnosis not present

## 2016-06-08 DIAGNOSIS — R7989 Other specified abnormal findings of blood chemistry: Secondary | ICD-10-CM | POA: Diagnosis not present

## 2016-06-08 DIAGNOSIS — R1310 Dysphagia, unspecified: Secondary | ICD-10-CM | POA: Diagnosis not present

## 2016-06-08 DIAGNOSIS — E559 Vitamin D deficiency, unspecified: Secondary | ICD-10-CM | POA: Diagnosis not present

## 2016-06-08 DIAGNOSIS — M6281 Muscle weakness (generalized): Secondary | ICD-10-CM | POA: Diagnosis not present

## 2016-06-08 DIAGNOSIS — Z8673 Personal history of transient ischemic attack (TIA), and cerebral infarction without residual deficits: Secondary | ICD-10-CM | POA: Diagnosis not present

## 2016-06-08 DIAGNOSIS — Z79899 Other long term (current) drug therapy: Secondary | ICD-10-CM | POA: Diagnosis not present

## 2016-06-08 DIAGNOSIS — R627 Adult failure to thrive: Secondary | ICD-10-CM | POA: Diagnosis not present

## 2016-06-08 DIAGNOSIS — R69 Illness, unspecified: Secondary | ICD-10-CM | POA: Diagnosis not present

## 2016-06-08 DIAGNOSIS — S72002D Fracture of unspecified part of neck of left femur, subsequent encounter for closed fracture with routine healing: Secondary | ICD-10-CM | POA: Diagnosis not present

## 2016-06-08 DIAGNOSIS — N390 Urinary tract infection, site not specified: Secondary | ICD-10-CM | POA: Diagnosis not present

## 2016-06-08 DIAGNOSIS — I10 Essential (primary) hypertension: Secondary | ICD-10-CM | POA: Diagnosis not present

## 2016-06-08 DIAGNOSIS — R040 Epistaxis: Secondary | ICD-10-CM | POA: Diagnosis not present

## 2016-06-08 DIAGNOSIS — I200 Unstable angina: Secondary | ICD-10-CM | POA: Diagnosis not present

## 2016-06-08 DIAGNOSIS — A498 Other bacterial infections of unspecified site: Secondary | ICD-10-CM | POA: Diagnosis not present

## 2016-06-08 DIAGNOSIS — E785 Hyperlipidemia, unspecified: Secondary | ICD-10-CM | POA: Diagnosis not present

## 2016-06-08 DIAGNOSIS — R1312 Dysphagia, oropharyngeal phase: Secondary | ICD-10-CM | POA: Diagnosis not present

## 2016-06-08 DIAGNOSIS — R296 Repeated falls: Secondary | ICD-10-CM | POA: Diagnosis not present

## 2016-06-08 DIAGNOSIS — R2681 Unsteadiness on feet: Secondary | ICD-10-CM | POA: Diagnosis not present

## 2016-06-08 DIAGNOSIS — D72829 Elevated white blood cell count, unspecified: Secondary | ICD-10-CM | POA: Diagnosis not present

## 2016-06-08 DIAGNOSIS — Z515 Encounter for palliative care: Secondary | ICD-10-CM

## 2016-06-08 DIAGNOSIS — R279 Unspecified lack of coordination: Secondary | ICD-10-CM | POA: Diagnosis not present

## 2016-06-08 DIAGNOSIS — E46 Unspecified protein-calorie malnutrition: Secondary | ICD-10-CM | POA: Diagnosis not present

## 2016-06-08 DIAGNOSIS — N183 Chronic kidney disease, stage 3 (moderate): Secondary | ICD-10-CM | POA: Diagnosis not present

## 2016-06-08 DIAGNOSIS — G9340 Encephalopathy, unspecified: Secondary | ICD-10-CM | POA: Diagnosis not present

## 2016-06-08 DIAGNOSIS — I2510 Atherosclerotic heart disease of native coronary artery without angina pectoris: Secondary | ICD-10-CM | POA: Diagnosis not present

## 2016-06-08 DIAGNOSIS — D62 Acute posthemorrhagic anemia: Secondary | ICD-10-CM | POA: Diagnosis not present

## 2016-06-08 DIAGNOSIS — Z789 Other specified health status: Secondary | ICD-10-CM | POA: Diagnosis not present

## 2016-06-08 DIAGNOSIS — R4701 Aphasia: Secondary | ICD-10-CM | POA: Diagnosis not present

## 2016-06-08 DIAGNOSIS — R05 Cough: Secondary | ICD-10-CM | POA: Diagnosis not present

## 2016-06-08 DIAGNOSIS — H3530 Unspecified macular degeneration: Secondary | ICD-10-CM | POA: Diagnosis not present

## 2016-06-08 DIAGNOSIS — I672 Cerebral atherosclerosis: Secondary | ICD-10-CM | POA: Diagnosis not present

## 2016-06-08 DIAGNOSIS — Z7401 Bed confinement status: Secondary | ICD-10-CM | POA: Diagnosis not present

## 2016-06-08 LAB — URINE CULTURE: Culture: >=100000 — AB

## 2016-06-08 MED ORDER — SENNOSIDES-DOCUSATE SODIUM 8.6-50 MG PO TABS: 2.0000 | ORAL_TABLET | Freq: Every day | ORAL | 0 refills | Status: AC

## 2016-06-08 MED ORDER — HYDROCODONE-ACETAMINOPHEN 5-325 MG PO TABS: 1.0000 | ORAL_TABLET | Freq: Four times a day (QID) | ORAL | 0 refills | Status: AC | PRN

## 2016-06-08 MED ORDER — HALOPERIDOL 0.5 MG PO TABS
0.5000 mg | ORAL_TABLET | Freq: Once | ORAL | Status: AC
  Administered 2016-06-08: 0.5 mg via ORAL
  Filled 2016-06-08: qty 1

## 2016-06-08 MED ORDER — NITROFURANTOIN MONOHYD MACRO 100 MG PO CAPS: 100.0000 mg | ORAL_CAPSULE | Freq: Two times a day (BID) | ORAL | 0 refills | Status: AC

## 2016-06-08 MED ORDER — NITROFURANTOIN MONOHYD MACRO 100 MG PO CAPS
100.0000 mg | ORAL_CAPSULE | Freq: Two times a day (BID) | ORAL | Status: DC
  Administered 2016-06-08: 100 mg via ORAL
  Filled 2016-06-08 (×7): qty 1

## 2016-06-08 MED ORDER — TRAZODONE HCL 50 MG PO TABS: 50.0000 mg | ORAL_TABLET | Freq: Every day | ORAL | Status: DC

## 2016-06-08 MED ORDER — ENSURE ENLIVE PO LIQD: 237.0000 mL | Freq: Three times a day (TID) | ORAL | 12 refills | Status: AC

## 2016-06-08 NOTE — Progress Notes (Addendum)
Pt moderately but pleasantly confused throughout night. Daughter has become increasingly anxious and irritated over her father's condition. Explained condition to daughter as well as pt's symptoms and educated on disease process. Contacted MD per daughter's request on several occasions for something to help him "rest', [facility]. Sat at pt's bedside for an hour to help him relax and encourage daughter to get some rest. Pt cooperative and somewhat restful during this time. When pt became more alert daughter again requested a call to the Dr.,no orders received. AC contacted and went and spoke with the daughter. Daughter has been encouraged to rest and to step off the floor for a break but refuses. Nurse tech and myself have cleaned and changed pt multiple times while daughter slept. Pt daughter does not wish for patient to have anymore IV sticks. Will continue to monitor.

## 2016-06-08 NOTE — Progress Notes (Signed)
Pt confused and pulled IV out, daughter does not want pt to continue to be "stuck like a pincushion", MD contacted and gave verbal order to leave IV out for remainder of shift until further evaluation on 2/16.

## 2016-06-08 NOTE — Progress Notes (Addendum)
Report called to Angie at [facility], answered questions at this time.  AVS printed and sent to facility.  Awaiting transportation to facility at this time.

## 2016-06-08 NOTE — Clinical Social Work Note (Addendum)
Pt d/c today back to [facility]. Pt's daughter, Glenard Haring and RN at facility aware and agreeable. Will transport via [employer].  Benay Pike, [place]

## 2016-06-08 NOTE — NC FL2 (Signed)
[place] LEVEL OF CARE SCREENING TOOL     IDENTIFICATION  Patient Name: David Collier Birthdate: 13-Mar-1929 Sex: male Admission Date (Current Location): 06/04/2016  [place] and [place] Number:  [place] [id number] [place] and Address:  [facility] [address] [place]      Provider Number: [id number]  Attending Physician Name and Address:  Florencia Reasons, MD  Relative Name and Phone Number:       Current Level of Care: Hospital Recommended Level of Care: [place] Prior Approval Number:    Date Approved/Denied:   PASRR Number: [id number]  Discharge Plan: SNF    Current Diagnoses: Patient Active Problem List   Diagnosis Date Noted  . Acute encephalopathy 06/05/2016  . Encephalopathy 06/04/2016  . Severe protein-calorie malnutrition ([place]) 06/04/2016  . Anemia 06/04/2016  . Bacteremia 10/08/2015  . Sepsis ([place]) 10/06/2015  . Lower urinary tract infectious disease 10/06/2015  . Acute renal failure superimposed on stage 3 chronic kidney disease ([place]) 10/06/2015  . Acute blood loss anemia 09/18/2015  . Closed left hip fracture ([place]) 09/17/2015  . Trochanteric fracture of right femur ([place]) 06/24/2015  . Community acquired pneumonia 06/24/2015  . Hip fracture ([place]) 06/24/2015  . Obesity (BMI 30-39.9) 07/11/2014  . Dementia 07/11/2014  . Unstable angina pectoris ([place]) 07/11/2014  . 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 CKD (chronic kidney disease), stage III 03/05/2014  . Macular degeneration (senile) of retina 02/16/2009  . Essential hypertension 02/16/2009  . Hyperlipidemia   . CAD (coronary artery disease), native coronary artery     Orientation RESPIRATION BLADDER Height & Weight     Self  Normal Incontinent Weight: 148 lb 11.2 oz (67.4 kg) Height:  5\' 8"  (172.7 cm)   BEHAVIORAL SYMPTOMS/MOOD NEUROLOGICAL BOWEL NUTRITION STATUS    Convulsions/Seizures (history) Incontinent Diet (strict aspiration precaution, puree diet, honey thick liquid)  AMBULATORY STATUS COMMUNICATION OF NEEDS Skin   Total Care Verbally Normal                       Personal Care Assistance Level of Assistance  Bathing, Feeding, Dressing Bathing Assistance: Maximum assistance Feeding assistance: Maximum assistance Dressing Assistance: Maximum assistance     Functional Limitations Info  Sight, Hearing, Speech Sight Info: Adequate Hearing Info: Impaired Speech Info: Adequate    SPECIAL CARE FACTORS FREQUENCY                       Contractures      Additional Factors Info  Psychotropic Code Status Info: DNR Allergies Info: Aricept (Donepezil Hcl), Ativan (Lorazepam), Atorvastatin, Namenda (Memantine), Other, Simvastatin, Sorbitan Psychotropic Info: Depakote, Haldol   Isolation Precautions Info: 10/06/15 Blood [place] = E. Coli ESBL (cj)     Current Medications (06/08/2016):  This is the current hospital active medication list Current Facility-Administered Medications  Medication Dose Route Frequency Provider Last Rate Last Dose  . acetaminophen (TYLENOL) tablet 650 mg  650 mg Oral Q6H PRN Karmen Bongo, MD       Or  . acetaminophen (TYLENOL) suppository 650 mg  650 mg Rectal Q6H PRN Karmen Bongo, MD      . albuterol (PROVENTIL) (2.5 MG/3ML) 0.083% nebulizer solution 2.5 mg  2.5 mg Nebulization Q6H PRN Karmen Bongo, MD      . aspirin EC tablet 325 mg  325 mg Oral Q breakfast Anderson Malta  Lorin Mercy, MD   325 mg at 06/08/16 0816  . divalproex (DEPAKOTE SPRINKLE) capsule 250 mg  250 mg Oral TID Karmen Bongo, MD   250 mg at 06/08/16 1523  . enoxaparin (LOVENOX) injection 40 mg  40 mg Subcutaneous Q24H Karmen Bongo, MD   40 mg at 06/07/16 2216  . feeding supplement (ENSURE ENLIVE) (ENSURE ENLIVE) liquid 237 mL  237 mL Oral TID WC Estela Leonie Green, MD    237 mL at 06/08/16 0818  . fluticasone (FLONASE) 50 MCG/ACT nasal spray 1 spray  1 spray Each Nare Daily Karmen Bongo, MD   1 spray at 06/08/16 0920  . haloperidol (HALDOL) tablet 0.5 mg  0.5 mg Oral Daily Karmen Bongo, MD   0.5 mg at 06/08/16 0920  . HYDROcodone-acetaminophen (NORCO/VICODIN) 5-325 MG per tablet 1 tablet  1 tablet Oral Q6H PRN Karmen Bongo, MD   1 tablet at 06/08/16 0122  . midodrine (PROAMATINE) tablet 2.5 mg  2.5 mg Oral TID WC Karmen Bongo, MD   2.5 mg at 06/08/16 1209  . nitrofurantoin (macrocrystal-monohydrate) (MACROBID) capsule 100 mg  100 mg Oral Q12H Florencia Reasons, MD   100 mg at 06/08/16 1214  . ondansetron (ZOFRAN) tablet 4 mg  4 mg Oral Q6H PRN Karmen Bongo, MD       Or  . ondansetron [facility]) injection 4 mg  4 mg Intravenous Q6H PRN Karmen Bongo, MD      . pantoprazole (PROTONIX) EC tablet 40 mg  40 mg Oral Daily Erline Hau, MD   40 mg at 06/08/16 0920  . senna (SENOKOT) tablet 8.6 mg  1 tablet Oral QHS Karmen Bongo, MD   8.6 mg at 06/07/16 2200  . sertraline (ZOLOFT) tablet 100 mg  100 mg Oral Daily Karmen Bongo, MD   100 mg at 06/08/16 0920  . traZODone (DESYREL) tablet 50 mg  50 mg Oral QHS Florencia Reasons, MD         Discharge Medications: Please see discharge summary for a list of discharge medications.  Relevant Imaging Results:  Relevant Lab Results:   Additional Information    Salome Arnt, [place]

## 2016-06-08 NOTE — Discharge Summary (Addendum)
Discharge Summary  David Collier [id number] DOB: 1929/02/15  PCP: Renata Caprice, DO  Admit date: 06/04/2016 Discharge date: 06/08/2016  Time spent: >45mins, more than 50% time spent of coordination of care  Recommendations for Outpatient Follow-up:  1. F/u with SNF MD  for hospital discharge follow up, repeat cbc/bmp at follow up 2. Patient to be on strict aspiration precaution, 3. Transition to hospice care when family feels appropriate, patient's health care power of attorney is open to discussion about hospice should patient does not have meaningfull recovery. for now they want to treat the treatable.  Discharge Diagnoses:  Active Hospital Problems   Diagnosis Date Noted  . Encephalopathy 06/04/2016  . Palliative care encounter   . Acute encephalopathy 06/05/2016  . Severe protein-calorie malnutrition ([place]) 06/04/2016  . Anemia 06/04/2016  . Lower urinary tract infectious disease 10/06/2015  . Dementia 07/11/2014  . CKD (chronic kidney disease), stage III 03/05/2014    Resolved Hospital Problems   Diagnosis Date Noted Date Resolved  No resolved problems to display.    Discharge Condition: stable  Diet recommendation: strict aspiration precaution, puree diet, honey thick liquid Repeat swallow eval in one week  Filed Weights   06/04/16 1331 06/04/16 2043  Weight: 72.6 kg (160 lb) 67.4 kg (148 lb 11.2 oz)    History of present illness:  Patient coming from:  [facility] - has been there since May 2017;  Chief Complaint: AMS  HPI: David GIOVANNONI is a 83 y.o. male with medical history significant of TIA, CAD, HTN, HLD, CKD, advanced dementia, and hospitalization for sepsis from a urinary source with ESBL organism in 6/17 presenting with AMS.  Since Saturday evening, he has been sleeping all the time.  This is generally the first sign of him having a UTI.  Yesterday, he became lethargic like he was when he was septic last June - wouldn't respond, wouldn't drink.  They  spoke with the nurse at [facility] and asked for evaluation for UTI.  Daughter called in today to check and then came to see him and he is even worse than he was.  They requested for him to be transferred to the ER.  UA was done yesterday PM, blood work was this AM and then they did more this afternoon; none of these results were back.  They were waiting for the doctor or NP to determine if he needed to come to the ER and the family got frustrtated and decided to call 911.  No fevers.    Has advanced dementia.  At baseline, wheels himself around in his wheelchair.  Has not recognized family for weeks to months.  Occasionally able to have a limited conversation.  Persistent unintentional weight loss.  He was given Rocephin and IVF in the ER.  Hospital Course:  Principal Problem:   Encephalopathy Active Problems:   CKD (chronic kidney disease), stage III   Dementia   Lower urinary tract infectious disease   Severe protein-calorie malnutrition (HCC)   Anemia   Acute encephalopathy   Palliative care encounter   Brief summary  83 y.o.malewith medical history significant of TIA, CAD, HTN, HLD, CKD, advanced dementia, and hospitalization for sepsis from a urinary source with ESBL organism in 6/17 presenting with AMS. Since Saturday evening, he has been sleeping all the time. This is generally the first sign of him having a UTI. Yesterday, he became lethargic like he was when he was septic last June - wouldn't respond, wouldn't drink. They spoke with the  nurse at [facility] and asked for evaluation for UTI. Daughter called in today to check and thencame to see him and he is even worse than he was. They requested for him to be transferred to the ER.UA was done yesterday PM, blood work was this AM and then they did more this afternoon; none of these results were back. They were waiting for the doctor or NP to determine if he needed to come to the ER and the family got frustrtated and decided to call  911. No fevers.   Has advanced dementia. At baseline, wheels himself around in his wheelchair. Has not recognized family for weeks to months. Occasionally able to have a limited conversation. Persistent unintentional weight loss.   Assessment/Plan:   Acute Encephalopathy.  Thought Due toUTIon top of baseline severe dementia. Neuro exam is non focal. CT head: +atrophy.  More alert, only oriented to self, with intermittent agitation  ESBL UTI. blood cultures 1/2 is positive on admission with staph capitis, repeat blood culture no growth. -Due to h/o ESBL E Coli in 6/17 has been placed on meropenem. Urine culture confirmed ESBL UTI, sensitive to nitrofurantoin, he is discharged on nitrofurantoin for three more days to finish total of 7 days treatment.  Probable pneumonia vs atelectasis.  He is on room air, not in respiratory distress Daughter is aware of the risk of recurrent aspiration should patient continue to deteriorate,  For now strict Aspiration precaution, daughter is thinking about transition to hospice care in the near future after fully discussed with palliative care.  CKD Stage II-III.Cr is at baseline of 1.1-1.3. Cr 1.47 on admission, cr 1.16 at discharge.  -discussed with family that due to severe baseline dementia, he may not return to prior level of functioning. May benefit from palliative care evaluation in the near future to further address natural progression of dementia and family coping mechanisms. In fact family report they have noticed dementia progression, and patient has significant weight loss in the last 6 months.  Code Status: DNR Family Communication: d/w patient, family in room and health care power of attorney over the phone Disposition Plan: SNF   Consultants:  Palliative care  Procedures:  None  Antimicrobials:                             Anti-infectives   Start   Dose/Rate Route Frequency Ordered Stop    06/04/16 2200  meropenem (MERREM) 1 g in sodium chloride 0.9 % 100 mL IVPB   1 g 200 mL/hr over 30 Minutes Intravenous Every 12 hours 06/04/16 2052    06/04/16 1830  cefTRIAXone (ROCEPHIN) 1 g in dextrose 5 % 50 mL IVPB   1 g 100 mL/hr over 30 Minutes Intravenous Once 06/04/16 1823 06/04/16 2010      Discharge Exam: BP 109/70 (BP Location: Right Arm)   Pulse 82   Temp 98.6 F (37 C) (Oral)   Resp 20   Ht 5\' 8"  (1.727 m)   Wt 67.4 kg (148 lb 11.2 oz)   SpO2 99%   BMI 22.61 kg/m     General:  Frail, only oriented to self, intermittent agitation, does not follow command  Cardiovascular: s1,s2 rrr  Respiratory: few rhonchi L  Abdomen: soft, nt, nd   Musculoskeletal: no leg edema     Discharge Instructions You were cared for by a hospitalist during your hospital stay. If you have any questions about your discharge medications or the  care you received while you were in the hospital after you are discharged, you can call the unit and asked to speak with the hospitalist on call if the hospitalist that took care of you is not available. Once you are discharged, your primary care physician will handle any further medical issues. Please note that NO REFILLS for any discharge medications will be authorized once you are discharged, as it is imperative that you return to your primary care physician (or establish a relationship with a primary care physician if you do not have one) for your aftercare needs so that they can reassess your need for medications and monitor your lab values.  Discharge Instructions    Diet general    Complete by:  As directed    Puree diet, honey thick liquid,  Repeat swallow eval in one week Strict aspiration precaution,   Increase activity slowly    Complete by:  As directed      Allergies as of 06/08/2016      Reactions   Aricept [donepezil Hcl] Other (See Comments)   Altered mental status   Ativan [lorazepam] Other (See Comments)   Altered  mental status   Atorvastatin Nausea And Vomiting, Other (See Comments)   Joint pain   Namenda [memantine] Other (See Comments)   Altered mental status   Other    Wife says pt was given 2 different medications for his nerves but said they made him have SI.   Simvastatin Nausea And Vomiting, Other (See Comments)   Joint pain   Sorbitan Nausea And Vomiting   headache      Medication List    STOP taking these medications   senna 8.6 MG Tabs tablet Commonly known as:  SENOKOT     TAKE these medications   acetaminophen 325 MG tablet Commonly known as:  TYLENOL Take 650 mg by mouth every 6 (six) hours as needed for mild pain or fever.   albuterol (2.5 MG/3ML) 0.083% nebulizer solution Commonly known as:  PROVENTIL Take 2.5 mg by nebulization every 6 (six) hours as needed for wheezing or shortness of breath.   aspirin 325 MG EC tablet Take 1 tablet (325 mg total) by mouth daily with breakfast.   cyanocobalamin 500 MCG tablet Take 500 mcg by mouth daily.   divalproex 125 MG capsule Commonly known as:  DEPAKOTE SPRINKLE Take 250 mg by mouth 3 (three) times daily.   feeding supplement (ENSURE ENLIVE) Liqd Take 237 mLs by mouth 3 (three) times daily with meals.   fluticasone 50 MCG/ACT nasal spray Commonly known as:  FLONASE Place 1 spray into both nostrils daily.   haloperidol 0.5 MG tablet Commonly known as:  HALDOL Take 0.5 mg by mouth daily.   HYDROcodone-acetaminophen 5-325 MG tablet Commonly known as:  NORCO Take 1 tablet by mouth every 6 (six) hours as needed for moderate pain.   iron polysaccharides 150 MG capsule Commonly known as:  NIFEREX Take 150 mg by mouth daily.   Melatonin 3 MG Caps Take 3 mg by mouth at bedtime.   midodrine 2.5 MG tablet Commonly known as:  PROAMATINE Take 2.5 mg by mouth 3 (three) times daily with meals.   nitrofurantoin (macrocrystal-monohydrate) 100 MG capsule Commonly known as:  MACROBID Take 1 capsule (100 mg total) by  mouth every 12 (twelve) hours.   NITROSTAT 0.4 MG SL tablet Generic drug:  nitroGLYCERIN DISSOLVE ONE TABLET UNDER THE TONGUE EVERY 5 MINUTES AS NEEDED FOR CHEST PAIN   pantoprazole 20 MG  tablet Commonly known as:  PROTONIX Take 1 tablet (20 mg total) by mouth daily.   senna-docusate 8.6-50 MG tablet Commonly known as:  Senokot-S Take 2 tablets by mouth at bedtime.   sertraline 100 MG tablet Commonly known as:  ZOLOFT Take 100 mg by mouth daily.   traZODone 50 MG tablet Commonly known as:  DESYREL Take 50 mg by mouth at bedtime.      Allergies  Allergen Reactions  . Aricept [Donepezil Hcl] Other (See Comments)    Altered mental status  . Ativan [Lorazepam] Other (See Comments)    Altered mental status  . Atorvastatin Nausea And Vomiting and Other (See Comments)    Joint pain  . Namenda [Memantine] Other (See Comments)    Altered mental status  . Other     Wife says pt was given 2 different medications for his nerves but said they made him have SI.  Marland Kitchen Simvastatin Nausea And Vomiting and Other (See Comments)    Joint pain  . Sorbitan Nausea And Vomiting    headache   Contact information for after-discharge care    Destination    [facility] .   Specialty:  [facility] information: [address] [place] [phone]               The results of significant diagnostics from this hospitalization (including imaging, microbiology, ancillary and laboratory) are listed below for reference.    Significant Diagnostic Studies: Dg Chest 2 View  Result Date: 06/04/2016 CLINICAL DATA:  83 year old with current history of Alzheimer's dementia, presenting to the emergency room with fatigue and generalized weakness. EXAM: CHEST  2 VIEW COMPARISON:  02/13/2016, 11/29/2015 and earlier. FINDINGS: AP erect and lateral images were obtained. Cardiac silhouette mildly enlarged, unchanged. Thoracic aorta  atherosclerotic, unchanged. Apparent superior mediastinal widening is felt to be due to mediastinal fat and AP technique. Respiratory motion blurred the lateral image. Consolidation in the right lower lobe. Lungs otherwise clear. Pulmonary vascularity normal. No visible pleural effusions. Severe T11 compression fracture with an associated kyphous deformity as noted previously. IMPRESSION: 1. Right lower lobe atelectasis and/or pneumonia. 2. Stable mild cardiomegaly without pulmonary edema. Electronically Signed   By: Evangeline Dakin M.D.   On: 06/04/2016 15:33   Ct Head Wo Contrast  Result Date: 06/04/2016 CLINICAL DATA:  Altered mental status. Fatigue. Alzheimer's. No known injury. EXAM: CT HEAD WITHOUT CONTRAST TECHNIQUE: Contiguous axial images were obtained from the base of the skull through the vertex without intravenous contrast. COMPARISON:  05/12/2016 FINDINGS: Brain: There is moderate central and cortical atrophy. Periventricular white matter changes are consistent with small vessel disease. Vascular: There is significant atherosclerotic calcification of the internal carotid arteries. Skull: No acute calvarial abnormality. Sinuses/Orbits: No acute finding. Other: Subacute fracture of the nasal bones. IMPRESSION: 1.  No evidence for acute intracranial abnormality. 2. Significant atrophy and small vessel disease. 3. Subacute fracture of the nasal bones Electronically Signed   By: Nolon Nations M.D.   On: 06/04/2016 15:49   Ct Head Wo Contrast  Result Date: 05/12/2016 CLINICAL DATA:  Fall from wheelchair striking face. Head and face pain. EXAM: CT HEAD WITHOUT CONTRAST CT MAXILLOFACIAL WITHOUT CONTRAST TECHNIQUE: Multidetector CT imaging of the head and maxillofacial structures were performed using the standard protocol without intravenous contrast. Multiplanar CT image reconstructions of the maxillofacial structures were also generated. COMPARISON:  Head CT 02/13/2016. FINDINGS: CT HEAD FINDINGS  Brain: No evidence of acute infarction, hemorrhage,  hydrocephalus, extra-axial collection or mass lesion/mass effect. Stable atrophy and chronic small vessel ischemia. Vascular: Atherosclerosis of skullbase vasculature without hyperdense vessel or abnormal calcification. Skull: Normal. Negative for fracture or focal lesion. Other: None. CT MAXILLOFACIAL FINDINGS Osseous: Depressed nasal bone fracture with associated soft tissue edema. No additional acute facial bone fracture. Zygomatic arches and mandibles are intact. Patient is edentulous. Orbits: Post bilateral cataract resection. No traumatic or inflammatory finding. Sinuses: Maxillary sinus mucosal thickening.  No fluid levels. Soft tissues: Edema about the nasal bridge. IMPRESSION: 1. No acute intracranial abnormality. Able atrophy and chronic small vessel ischemia. 2. Acute depressed nasal bone fracture. Electronically Signed   By: Jeb Levering M.D.   On: 05/12/2016 23:25   Dg Chest Port 1 View  Result Date: 06/08/2016 CLINICAL DATA:  Cough.  Sepsis. EXAM: PORTABLE CHEST 1 VIEW COMPARISON:  06/04/2016. FINDINGS: Low lung volumes accentuate the heart size. Thoracic atherosclerosis. Platelike atelectasis at the lung bases, with small effusions. No consolidation or edema. IMPRESSION: Worsening aeration. Low lung volumes with atelectasis and small effusions. No overt failure or consolidation. Electronically Signed   By: Staci Righter M.D.   On: 06/08/2016 14:35   Ct Maxillofacial Wo Cm  Result Date: 05/12/2016 CLINICAL DATA:  Fall from wheelchair striking face. Head and face pain. EXAM: CT HEAD WITHOUT CONTRAST CT MAXILLOFACIAL WITHOUT CONTRAST TECHNIQUE: Multidetector CT imaging of the head and maxillofacial structures were performed using the standard protocol without intravenous contrast. Multiplanar CT image reconstructions of the maxillofacial structures were also generated. COMPARISON:  Head CT 02/13/2016. FINDINGS: CT HEAD FINDINGS Brain: No  evidence of acute infarction, hemorrhage, hydrocephalus, extra-axial collection or mass lesion/mass effect. Stable atrophy and chronic small vessel ischemia. Vascular: Atherosclerosis of skullbase vasculature without hyperdense vessel or abnormal calcification. Skull: Normal. Negative for fracture or focal lesion. Other: None. CT MAXILLOFACIAL FINDINGS Osseous: Depressed nasal bone fracture with associated soft tissue edema. No additional acute facial bone fracture. Zygomatic arches and mandibles are intact. Patient is edentulous. Orbits: Post bilateral cataract resection. No traumatic or inflammatory finding. Sinuses: Maxillary sinus mucosal thickening.  No fluid levels. Soft tissues: Edema about the nasal bridge. IMPRESSION: 1. No acute intracranial abnormality. Able atrophy and chronic small vessel ischemia. 2. Acute depressed nasal bone fracture. Electronically Signed   By: Jeb Levering M.D.   On: 05/12/2016 23:25    Microbiology: Recent Results (from the past 240 hour(s))  Culture, Urine     Status: Abnormal   Collection Time: 06/04/16  1:35 PM  Result Value Ref Range Status   Specimen Description URINE, RANDOM  Final   Special Requests NONE  Final   Culture >=100,000 COLONIES/mL ESCHERICHIA COLI (A)  Final   Report Status 06/08/2016 FINAL  Final   Organism ID, Bacteria ESCHERICHIA COLI (A)  Final      Susceptibility   Escherichia coli - MIC*    AMPICILLIN >=32 RESISTANT Resistant     CEFAZOLIN >=64 RESISTANT Resistant     CEFTRIAXONE >=64 RESISTANT Resistant     CIPROFLOXACIN >=4 RESISTANT Resistant     GENTAMICIN <=1 SENSITIVE Sensitive     IMIPENEM <=0.25 SENSITIVE Sensitive     NITROFURANTOIN <=16 SENSITIVE Sensitive     TRIMETH/SULFA >=320 RESISTANT Resistant     AMPICILLIN/SULBACTAM >=32 RESISTANT Resistant     PIP/TAZO 8 SENSITIVE Sensitive     * >=100,000 COLONIES/mL ESCHERICHIA COLI  Culture, blood (routine x 2)     Status: Abnormal   Collection Time: 06/04/16  7:01 PM   Result  Value Ref Range Status   Specimen Description BLOOD RIGHT WRIST  Final   Special Requests BOTTLES DRAWN AEROBIC AND ANAEROBIC 6CC EACH  Final   Culture  Setup Time   Final    GRAM POSITIVE COCCI IN CLUSTERS AEB BOTTLE AT [facility] Gram Stain Report Called to,Read Back By and Verified With: DANIEL C AT 0452 ON [id number] BY FORSYTH K    Culture (A)  Final    STAPHYLOCOCCUS CAPITIS THE SIGNIFICANCE OF ISOLATING THIS ORGANISM FROM A SINGLE SET OF BLOOD CULTURES WHEN MULTIPLE SETS ARE DRAWN IS UNCERTAIN. PLEASE NOTIFY THE MICROBIOLOGY DEPARTMENT WITHIN ONE WEEK IF SPECIATION AND SENSITIVITIES ARE REQUIRED. Performed at [facility], [place] [address], [place], [place] [address]    Report Status 06/07/2016 FINAL  Final  MRSA PCR Screening     Status: None   Collection Time: 06/04/16  8:54 PM  Result Value Ref Range Status   MRSA by PCR NEGATIVE NEGATIVE Final    Comment:        The GeneXpert MRSA Assay (FDA approved for NASAL specimens only), is one component of a comprehensive MRSA colonization surveillance program. It is not intended to diagnose MRSA infection nor to guide or monitor treatment for MRSA infections.   Culture, blood (routine x 2)     Status: None (Preliminary result)   Collection Time: 06/04/16 10:26 PM  Result Value Ref Range Status   Specimen Description RIGHT ANTECUBITAL  Final   Special Requests BOTTLES DRAWN AEROBIC AND ANAEROBIC 6CC EACH  Final   Culture NO GROWTH 4 DAYS  Final   Report Status PENDING  Incomplete  Culture, blood (routine x 2)     Status: None (Preliminary result)   Collection Time: 06/07/16  8:34 PM  Result Value Ref Range Status   Specimen Description LEFT ANTECUBITAL  Final   Special Requests BOTTLES DRAWN AEROBIC AND ANAEROBIC 6CC EACH  Final   Culture NO GROWTH < 12 HOURS  Final   Report Status PENDING  Incomplete  Culture, blood (routine x 2)     Status: None (Preliminary result)   Collection Time: 06/07/16  8:44 PM  Result Value  Ref Range Status   Specimen Description BLOOD LEFT FOREARM  Final   Special Requests BOTTLES DRAWN AEROBIC AND ANAEROBIC 6CC EACH  Final   Culture NO GROWTH < 12 HOURS  Final   Report Status PENDING  Incomplete     Labs: Basic Metabolic Panel:  Recent Labs Lab 06/04/16 1411 06/05/16 0627 06/06/16 0454  NA 137 135 135  K 3.8 3.7 3.9  CL 103 101 103  CO2 28 27 24   GLUCOSE 89 82 83  BUN 31* 24* 20  CREATININE 1.47* 1.11 1.16  CALCIUM 8.6* 8.3* 8.4*   Liver Function Tests:  Recent Labs Lab 06/04/16 1411  AST 13*  ALT 9*  ALKPHOS 59  BILITOT 0.7  PROT 6.1*  ALBUMIN 2.4*    Recent Labs Lab 06/04/16 1411  LIPASE 14   No results for input(s): AMMONIA in the last 168 hours. CBC:  Recent Labs Lab 06/04/16 1411 06/05/16 0627 06/06/16 0454  WBC 14.6* 11.6* 11.0*  HGB 9.9* 9.3* 9.6*  HCT 30.6* 28.5* 29.3*  MCV 92.7 91.6 91.3  PLT 214 229 271   Cardiac Enzymes: No results for input(s): CKTOTAL, CKMB, CKMBINDEX, TROPONINI in the last 168 hours. BNP: BNP (last 3 results) No results for input(s): BNP in the last 8760 hours.  ProBNP (last 3 results) No results for input(s):  PROBNP in the last 8760 hours.  CBG: No results for input(s): GLUCAP in the last 168 hours.     SignedFlorencia Reasons MD, PhD  [facility] 06/08/2016, 7:44 PM

## 2016-06-08 NOTE — Plan of Care (Signed)
Call to daughter Sylvan Cheese cell phone, left message requesting meeting Friday at 10 AM. She is to call me if this time will work for her.  We will discuss the MOST form.

## 2016-06-09 LAB — CULTURE, BLOOD (ROUTINE X 2): Culture: NO GROWTH

## 2016-06-11 DIAGNOSIS — R627 Adult failure to thrive: Secondary | ICD-10-CM | POA: Diagnosis not present

## 2016-06-11 DIAGNOSIS — D72829 Elevated white blood cell count, unspecified: Secondary | ICD-10-CM | POA: Diagnosis not present

## 2016-06-11 DIAGNOSIS — N390 Urinary tract infection, site not specified: Secondary | ICD-10-CM | POA: Diagnosis not present

## 2016-06-11 DIAGNOSIS — N183 Chronic kidney disease, stage 3 (moderate): Secondary | ICD-10-CM | POA: Diagnosis not present

## 2016-06-12 LAB — CULTURE, BLOOD (ROUTINE X 2)
Culture: NO GROWTH
Culture: NO GROWTH

## 2016-06-28 DIAGNOSIS — R296 Repeated falls: Secondary | ICD-10-CM | POA: Diagnosis not present

## 2016-06-28 DIAGNOSIS — R1310 Dysphagia, unspecified: Secondary | ICD-10-CM | POA: Diagnosis not present

## 2016-06-28 DIAGNOSIS — R627 Adult failure to thrive: Secondary | ICD-10-CM | POA: Diagnosis not present

## 2016-07-05 DIAGNOSIS — R040 Epistaxis: Secondary | ICD-10-CM | POA: Diagnosis not present

## 2016-07-05 DIAGNOSIS — R296 Repeated falls: Secondary | ICD-10-CM | POA: Diagnosis not present

## 2016-07-30 DIAGNOSIS — N390 Urinary tract infection, site not specified: Secondary | ICD-10-CM | POA: Diagnosis not present

## 2016-07-30 DIAGNOSIS — D72829 Elevated white blood cell count, unspecified: Secondary | ICD-10-CM | POA: Diagnosis not present

## 2016-07-30 DIAGNOSIS — N183 Chronic kidney disease, stage 3 (moderate): Secondary | ICD-10-CM | POA: Diagnosis not present

## 2016-08-03 DIAGNOSIS — R69 Illness, unspecified: Secondary | ICD-10-CM | POA: Diagnosis not present

## 2016-08-06 DIAGNOSIS — D72829 Elevated white blood cell count, unspecified: Secondary | ICD-10-CM | POA: Diagnosis not present

## 2016-08-06 DIAGNOSIS — N390 Urinary tract infection, site not specified: Secondary | ICD-10-CM | POA: Diagnosis not present

## 2016-08-06 DIAGNOSIS — N183 Chronic kidney disease, stage 3 (moderate): Secondary | ICD-10-CM | POA: Diagnosis not present

## 2016-08-09 DIAGNOSIS — R627 Adult failure to thrive: Secondary | ICD-10-CM | POA: Diagnosis not present

## 2016-08-09 DIAGNOSIS — N183 Chronic kidney disease, stage 3 (moderate): Secondary | ICD-10-CM | POA: Diagnosis not present

## 2016-08-13 DIAGNOSIS — R1310 Dysphagia, unspecified: Secondary | ICD-10-CM | POA: Diagnosis not present

## 2016-08-13 DIAGNOSIS — R627 Adult failure to thrive: Secondary | ICD-10-CM | POA: Diagnosis not present

## 2016-08-17 DIAGNOSIS — E46 Unspecified protein-calorie malnutrition: Secondary | ICD-10-CM | POA: Diagnosis not present

## 2016-08-18 DIAGNOSIS — E46 Unspecified protein-calorie malnutrition: Secondary | ICD-10-CM | POA: Diagnosis not present

## 2016-08-19 DIAGNOSIS — E46 Unspecified protein-calorie malnutrition: Secondary | ICD-10-CM | POA: Diagnosis not present

## 2016-08-20 DIAGNOSIS — E46 Unspecified protein-calorie malnutrition: Secondary | ICD-10-CM | POA: Diagnosis not present

## 2016-08-21 DIAGNOSIS — E46 Unspecified protein-calorie malnutrition: Secondary | ICD-10-CM | POA: Diagnosis not present

## 2016-08-22 DIAGNOSIS — E46 Unspecified protein-calorie malnutrition: Secondary | ICD-10-CM | POA: Diagnosis not present

## 2016-08-23 DIAGNOSIS — E46 Unspecified protein-calorie malnutrition: Secondary | ICD-10-CM | POA: Diagnosis not present

## 2016-08-24 DIAGNOSIS — E46 Unspecified protein-calorie malnutrition: Secondary | ICD-10-CM | POA: Diagnosis not present

## 2016-08-25 DIAGNOSIS — E46 Unspecified protein-calorie malnutrition: Secondary | ICD-10-CM | POA: Diagnosis not present

## 2016-08-26 DIAGNOSIS — E46 Unspecified protein-calorie malnutrition: Secondary | ICD-10-CM | POA: Diagnosis not present

## 2016-08-27 DIAGNOSIS — R296 Repeated falls: Secondary | ICD-10-CM | POA: Diagnosis not present

## 2016-08-27 DIAGNOSIS — R627 Adult failure to thrive: Secondary | ICD-10-CM | POA: Diagnosis not present

## 2016-08-27 DIAGNOSIS — G4709 Other insomnia: Secondary | ICD-10-CM | POA: Diagnosis not present

## 2016-08-27 DIAGNOSIS — E46 Unspecified protein-calorie malnutrition: Secondary | ICD-10-CM | POA: Diagnosis not present

## 2016-08-28 DIAGNOSIS — E46 Unspecified protein-calorie malnutrition: Secondary | ICD-10-CM | POA: Diagnosis not present

## 2016-08-28 DIAGNOSIS — M79674 Pain in right toe(s): Secondary | ICD-10-CM | POA: Diagnosis not present

## 2016-08-28 DIAGNOSIS — M79675 Pain in left toe(s): Secondary | ICD-10-CM | POA: Diagnosis not present

## 2016-08-28 DIAGNOSIS — B351 Tinea unguium: Secondary | ICD-10-CM | POA: Diagnosis not present

## 2016-08-29 DIAGNOSIS — E46 Unspecified protein-calorie malnutrition: Secondary | ICD-10-CM | POA: Diagnosis not present

## 2016-08-30 DIAGNOSIS — Z79899 Other long term (current) drug therapy: Secondary | ICD-10-CM | POA: Diagnosis not present

## 2016-08-30 DIAGNOSIS — N390 Urinary tract infection, site not specified: Secondary | ICD-10-CM | POA: Diagnosis not present

## 2016-08-30 DIAGNOSIS — R319 Hematuria, unspecified: Secondary | ICD-10-CM | POA: Diagnosis not present

## 2016-08-30 DIAGNOSIS — A498 Other bacterial infections of unspecified site: Secondary | ICD-10-CM | POA: Diagnosis not present

## 2016-08-30 DIAGNOSIS — E46 Unspecified protein-calorie malnutrition: Secondary | ICD-10-CM | POA: Diagnosis not present

## 2016-08-31 DIAGNOSIS — E46 Unspecified protein-calorie malnutrition: Secondary | ICD-10-CM | POA: Diagnosis not present

## 2016-09-01 DIAGNOSIS — E46 Unspecified protein-calorie malnutrition: Secondary | ICD-10-CM | POA: Diagnosis not present

## 2016-09-02 DIAGNOSIS — E46 Unspecified protein-calorie malnutrition: Secondary | ICD-10-CM | POA: Diagnosis not present

## 2016-09-03 DIAGNOSIS — E46 Unspecified protein-calorie malnutrition: Secondary | ICD-10-CM | POA: Diagnosis not present

## 2016-09-04 DIAGNOSIS — E46 Unspecified protein-calorie malnutrition: Secondary | ICD-10-CM | POA: Diagnosis not present

## 2016-09-05 DIAGNOSIS — E46 Unspecified protein-calorie malnutrition: Secondary | ICD-10-CM | POA: Diagnosis not present

## 2016-09-06 DIAGNOSIS — E46 Unspecified protein-calorie malnutrition: Secondary | ICD-10-CM | POA: Diagnosis not present

## 2016-09-06 DIAGNOSIS — N390 Urinary tract infection, site not specified: Secondary | ICD-10-CM | POA: Diagnosis not present

## 2016-09-06 DIAGNOSIS — R627 Adult failure to thrive: Secondary | ICD-10-CM | POA: Diagnosis not present

## 2016-09-07 DIAGNOSIS — E46 Unspecified protein-calorie malnutrition: Secondary | ICD-10-CM | POA: Diagnosis not present

## 2016-09-08 DIAGNOSIS — E46 Unspecified protein-calorie malnutrition: Secondary | ICD-10-CM | POA: Diagnosis not present

## 2016-09-09 DIAGNOSIS — E46 Unspecified protein-calorie malnutrition: Secondary | ICD-10-CM | POA: Diagnosis not present

## 2016-09-10 DIAGNOSIS — E46 Unspecified protein-calorie malnutrition: Secondary | ICD-10-CM | POA: Diagnosis not present

## 2016-09-11 DIAGNOSIS — E46 Unspecified protein-calorie malnutrition: Secondary | ICD-10-CM | POA: Diagnosis not present

## 2016-09-12 DIAGNOSIS — E46 Unspecified protein-calorie malnutrition: Secondary | ICD-10-CM | POA: Diagnosis not present

## 2016-09-13 DIAGNOSIS — E46 Unspecified protein-calorie malnutrition: Secondary | ICD-10-CM | POA: Diagnosis not present

## 2016-09-14 DIAGNOSIS — E46 Unspecified protein-calorie malnutrition: Secondary | ICD-10-CM | POA: Diagnosis not present

## 2016-09-15 DIAGNOSIS — E46 Unspecified protein-calorie malnutrition: Secondary | ICD-10-CM | POA: Diagnosis not present

## 2016-09-16 DIAGNOSIS — E46 Unspecified protein-calorie malnutrition: Secondary | ICD-10-CM | POA: Diagnosis not present

## 2016-09-17 DIAGNOSIS — E46 Unspecified protein-calorie malnutrition: Secondary | ICD-10-CM | POA: Diagnosis not present

## 2016-09-18 DIAGNOSIS — E46 Unspecified protein-calorie malnutrition: Secondary | ICD-10-CM | POA: Diagnosis not present

## 2016-09-19 DIAGNOSIS — E46 Unspecified protein-calorie malnutrition: Secondary | ICD-10-CM | POA: Diagnosis not present

## 2016-09-20 DIAGNOSIS — E46 Unspecified protein-calorie malnutrition: Secondary | ICD-10-CM | POA: Diagnosis not present

## 2016-09-21 DIAGNOSIS — R7989 Other specified abnormal findings of blood chemistry: Secondary | ICD-10-CM | POA: Diagnosis not present

## 2016-09-21 DIAGNOSIS — E46 Unspecified protein-calorie malnutrition: Secondary | ICD-10-CM | POA: Diagnosis not present

## 2016-09-21 DIAGNOSIS — R319 Hematuria, unspecified: Secondary | ICD-10-CM | POA: Diagnosis not present

## 2016-09-21 DIAGNOSIS — Z79899 Other long term (current) drug therapy: Secondary | ICD-10-CM | POA: Diagnosis not present

## 2016-09-21 DIAGNOSIS — N390 Urinary tract infection, site not specified: Secondary | ICD-10-CM | POA: Diagnosis not present

## 2016-09-21 DIAGNOSIS — D649 Anemia, unspecified: Secondary | ICD-10-CM | POA: Diagnosis not present

## 2016-09-22 DIAGNOSIS — E46 Unspecified protein-calorie malnutrition: Secondary | ICD-10-CM | POA: Diagnosis not present

## 2016-09-23 DIAGNOSIS — E46 Unspecified protein-calorie malnutrition: Secondary | ICD-10-CM | POA: Diagnosis not present

## 2016-09-24 DIAGNOSIS — E46 Unspecified protein-calorie malnutrition: Secondary | ICD-10-CM | POA: Diagnosis not present

## 2016-09-25 DIAGNOSIS — R627 Adult failure to thrive: Secondary | ICD-10-CM | POA: Diagnosis not present

## 2016-09-25 DIAGNOSIS — N390 Urinary tract infection, site not specified: Secondary | ICD-10-CM | POA: Diagnosis not present

## 2016-09-25 DIAGNOSIS — E46 Unspecified protein-calorie malnutrition: Secondary | ICD-10-CM | POA: Diagnosis not present

## 2016-09-26 DIAGNOSIS — E46 Unspecified protein-calorie malnutrition: Secondary | ICD-10-CM | POA: Diagnosis not present

## 2016-09-27 DIAGNOSIS — E46 Unspecified protein-calorie malnutrition: Secondary | ICD-10-CM | POA: Diagnosis not present

## 2016-09-28 DIAGNOSIS — E46 Unspecified protein-calorie malnutrition: Secondary | ICD-10-CM | POA: Diagnosis not present

## 2016-09-29 DIAGNOSIS — E46 Unspecified protein-calorie malnutrition: Secondary | ICD-10-CM | POA: Diagnosis not present

## 2016-09-30 DIAGNOSIS — E46 Unspecified protein-calorie malnutrition: Secondary | ICD-10-CM | POA: Diagnosis not present

## 2016-10-01 DIAGNOSIS — E46 Unspecified protein-calorie malnutrition: Secondary | ICD-10-CM | POA: Diagnosis not present

## 2016-10-02 DIAGNOSIS — E46 Unspecified protein-calorie malnutrition: Secondary | ICD-10-CM | POA: Diagnosis not present

## 2016-10-03 DIAGNOSIS — E46 Unspecified protein-calorie malnutrition: Secondary | ICD-10-CM | POA: Diagnosis not present

## 2016-10-04 DIAGNOSIS — E46 Unspecified protein-calorie malnutrition: Secondary | ICD-10-CM | POA: Diagnosis not present

## 2016-10-04 DIAGNOSIS — R627 Adult failure to thrive: Secondary | ICD-10-CM | POA: Diagnosis not present

## 2016-10-04 DIAGNOSIS — N390 Urinary tract infection, site not specified: Secondary | ICD-10-CM | POA: Diagnosis not present

## 2016-10-04 DIAGNOSIS — N183 Chronic kidney disease, stage 3 (moderate): Secondary | ICD-10-CM | POA: Diagnosis not present

## 2016-10-05 DIAGNOSIS — E46 Unspecified protein-calorie malnutrition: Secondary | ICD-10-CM | POA: Diagnosis not present

## 2016-10-06 DIAGNOSIS — E46 Unspecified protein-calorie malnutrition: Secondary | ICD-10-CM | POA: Diagnosis not present

## 2016-10-07 DIAGNOSIS — E46 Unspecified protein-calorie malnutrition: Secondary | ICD-10-CM | POA: Diagnosis not present

## 2016-10-08 DIAGNOSIS — N183 Chronic kidney disease, stage 3 (moderate): Secondary | ICD-10-CM | POA: Diagnosis not present

## 2016-10-08 DIAGNOSIS — R627 Adult failure to thrive: Secondary | ICD-10-CM | POA: Diagnosis not present

## 2016-10-21 DEATH — deceased

## 2016-11-18 IMAGING — CR DG CHEST 1V PORT
1 series · 1 of 1 positions shown · non-contrast
Comparison: Chest radiograph performed 09/17/2015

CLINICAL DATA: Acute onset of altered mental status. Code sepsis.
Fever and lethargy. Initial encounter.

EXAM:
PORTABLE CHEST 1 VIEW

[ap portable]
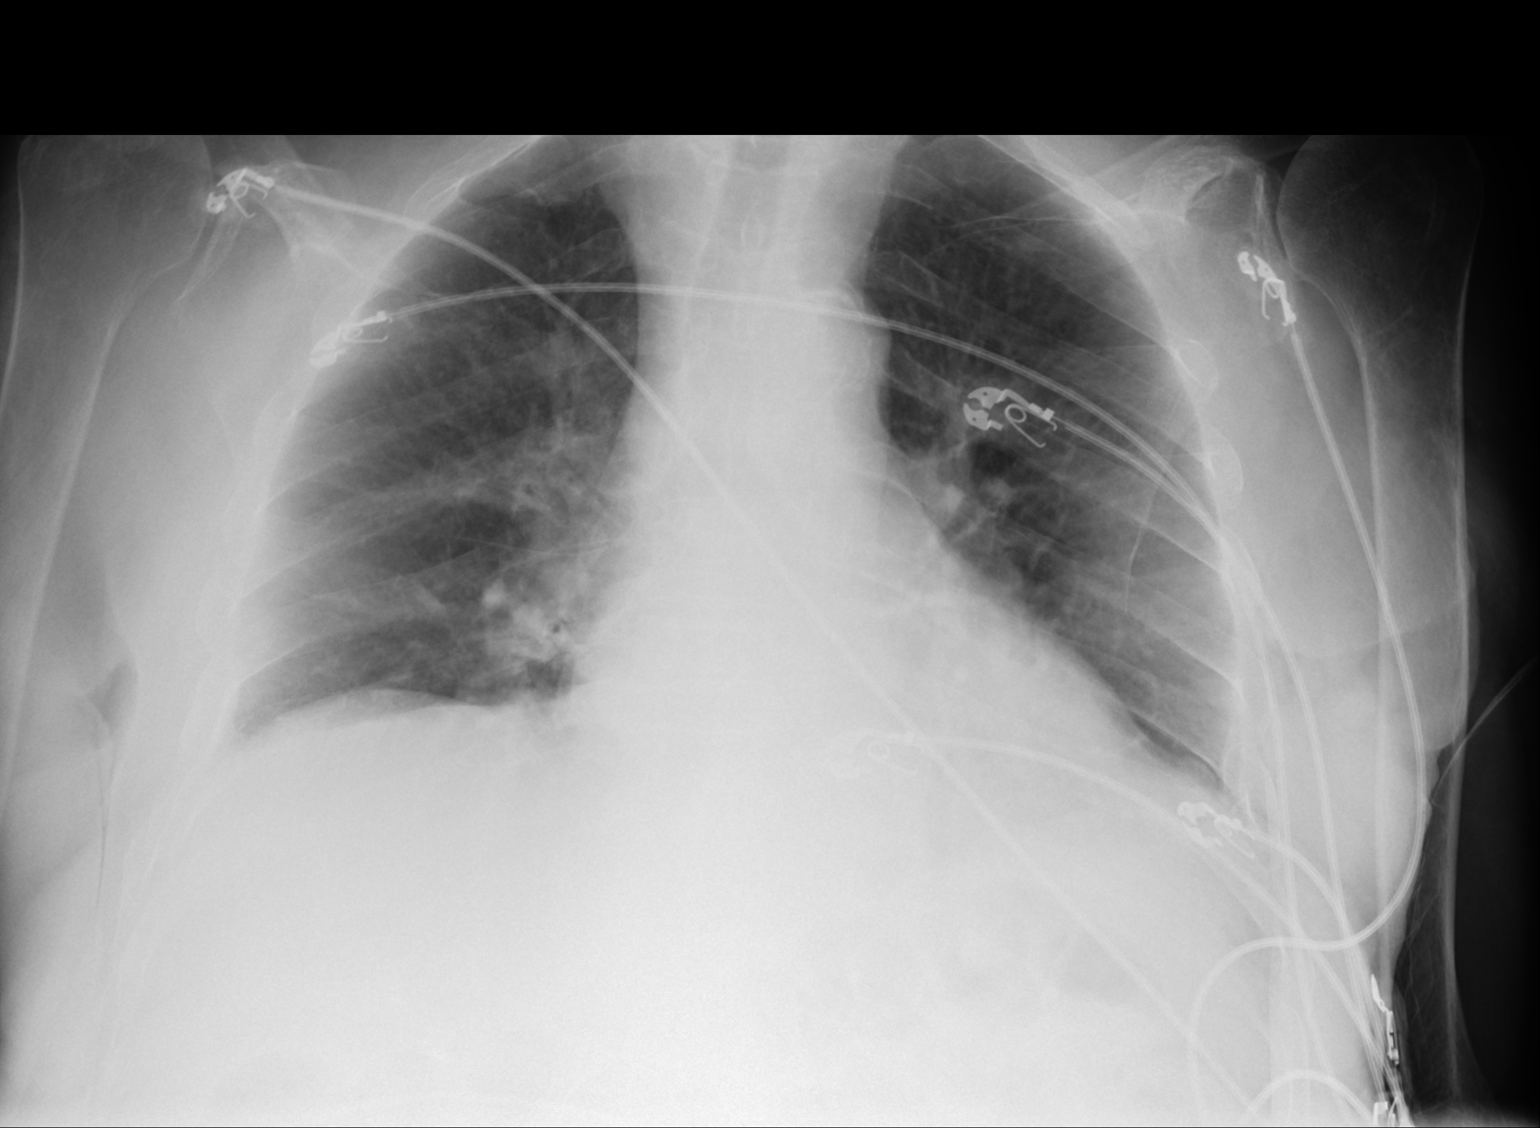

[1 of 1 positions shown; findings below may reference images not displayed]

FINDINGS: The lungs are well-aerated. Mild vascular congestion is noted, with
mild left basilar opacity possibly reflecting atelectasis or mild
pneumonia. There is no evidence of pleural effusion or pneumothorax.

The cardiomediastinal silhouette is borderline normal in size. No
acute osseous abnormalities are seen.
IMPRESSION: Mild vascular congestion noted. Mild left basilar opacity may
reflect atelectasis or mild pneumonia.

## 2017-03-28 IMAGING — DX DG FEMUR 2+V*R*
4 series · 4 of 4 positions shown · non-contrast
Comparison: Fluoroscopic views of the right femur from 06/25/2015,
preoperative radiographs from 06/24/2015. No

CLINICAL DATA: Pain after fall from wheelchair

EXAM:
RIGHT FEMUR 2 VIEWS

[hip ap (1 of 2)]
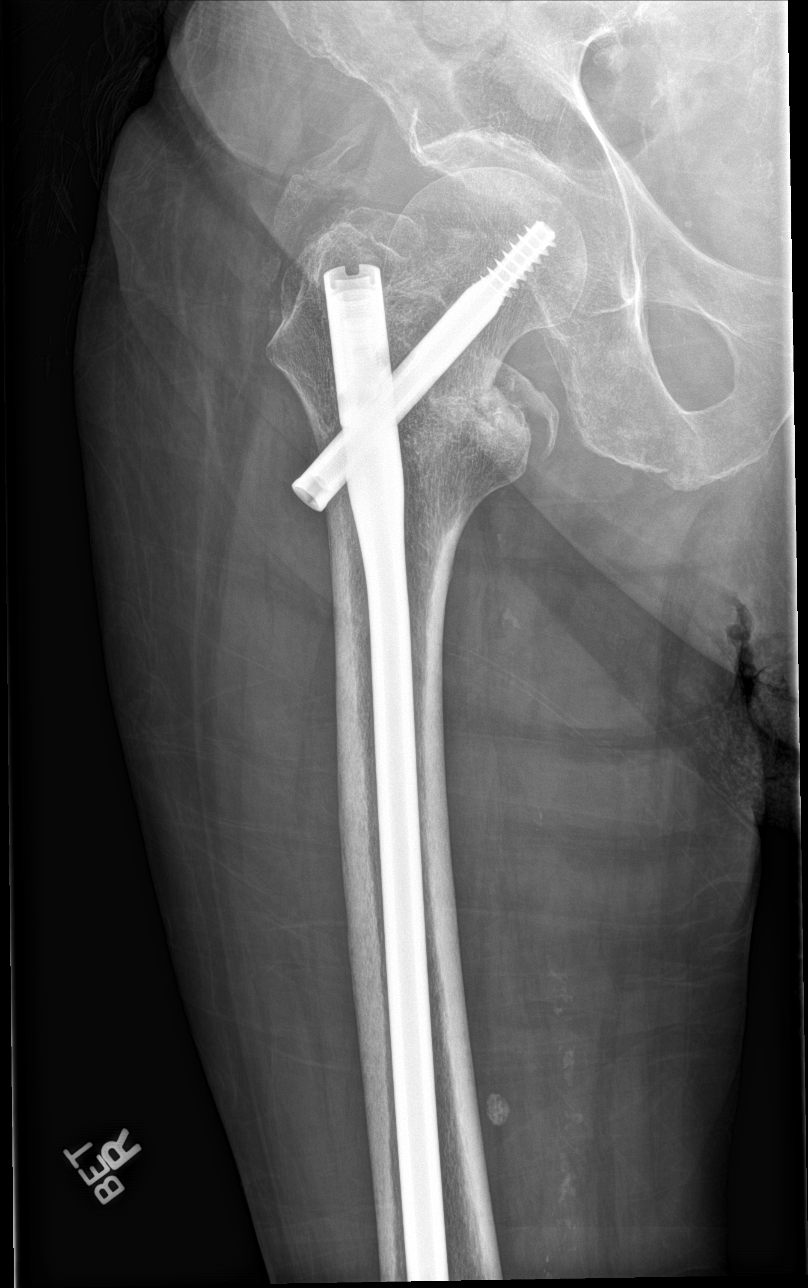

[hip lat (1 of 2)]
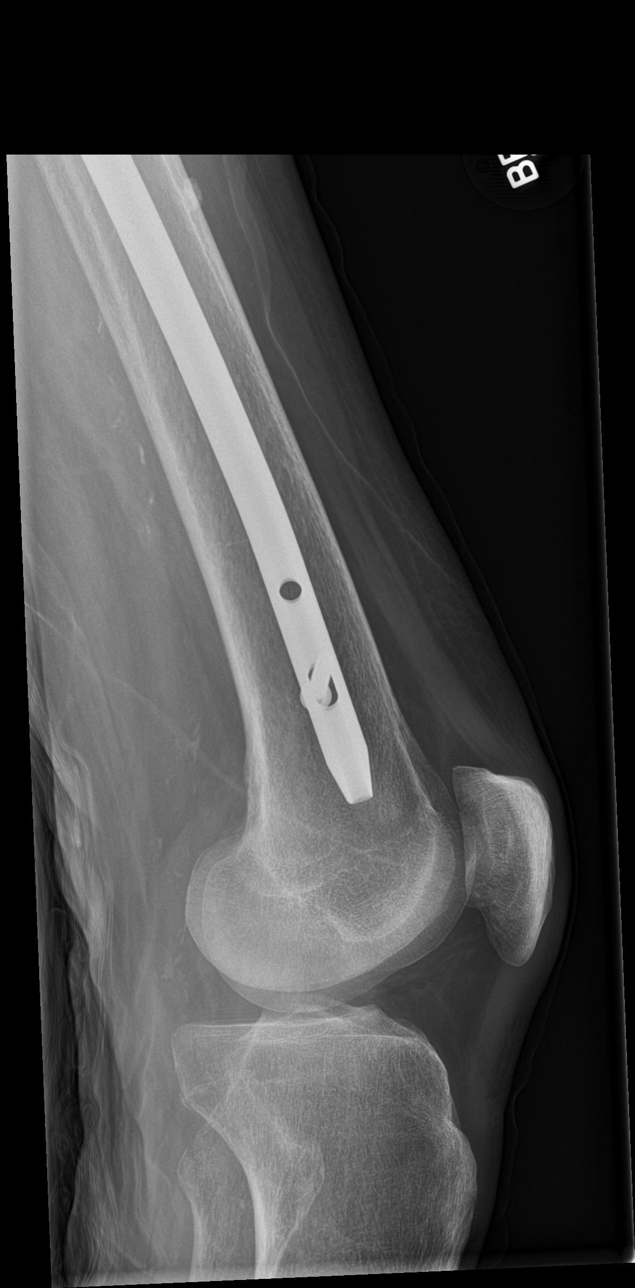

[hip ap (2 of 2)]
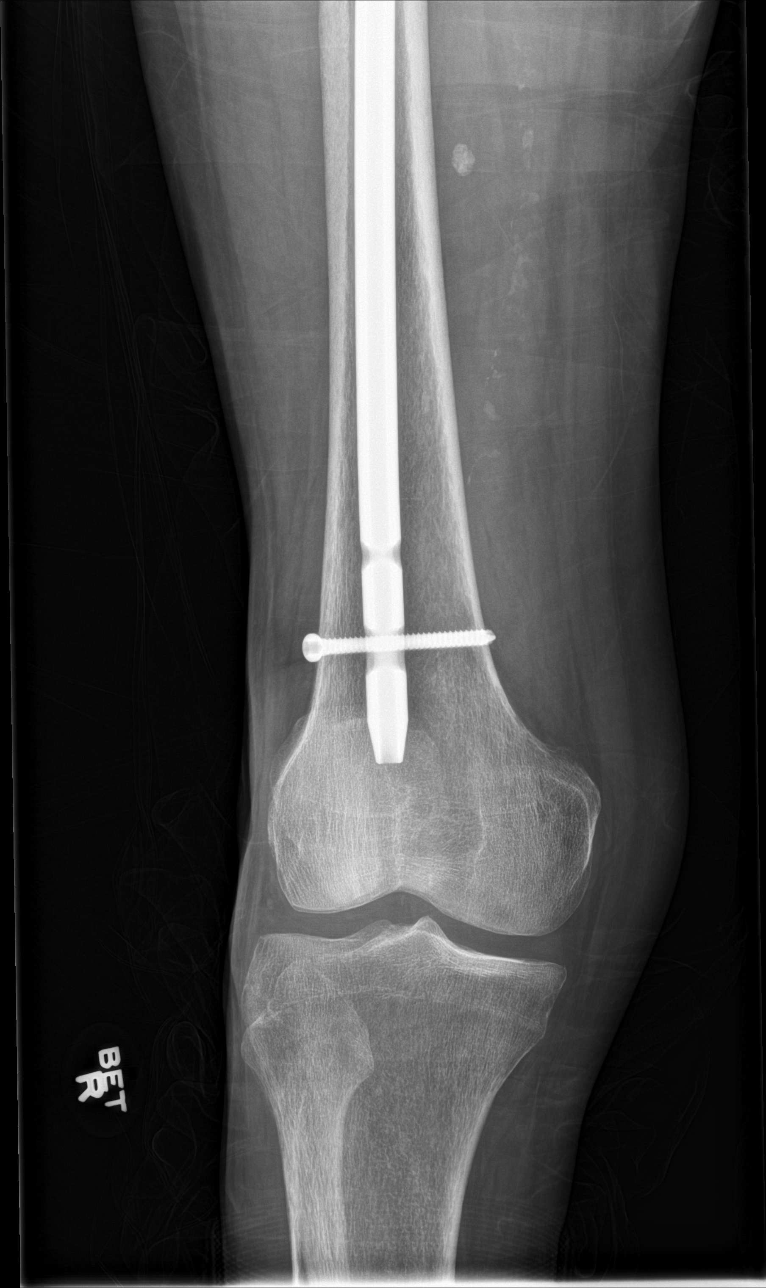

[hip lat (2 of 2)]
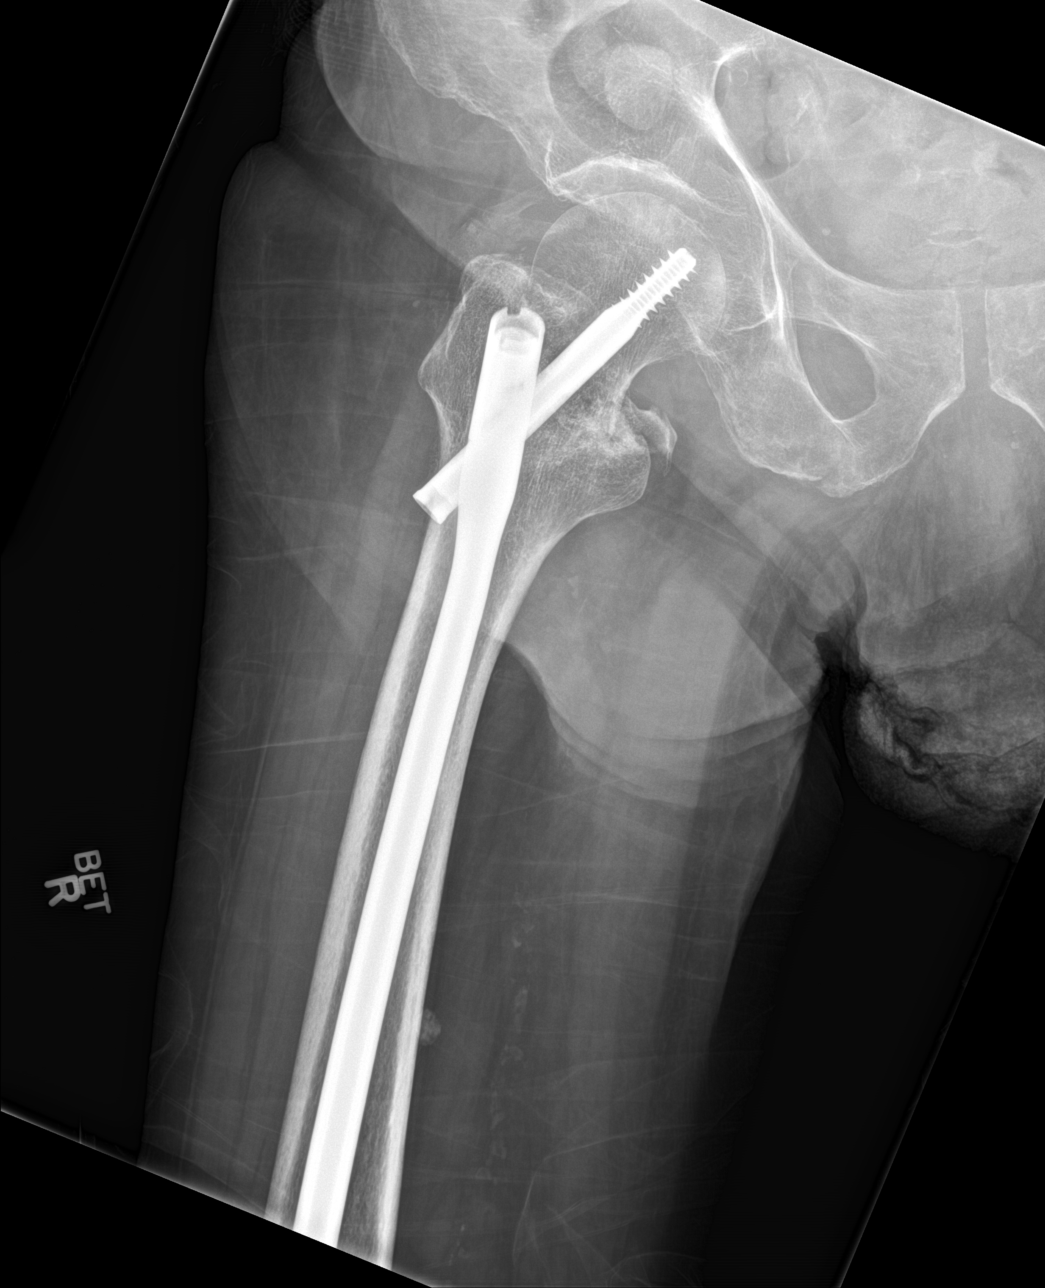

[4 of 4 positions shown; findings below may reference images not displayed]

FINDINGS: There is a new subtle impaction along the medial base of the right
femoral neck since the 06/25/2015 fluoroscopic images. Small avulsed
bony fragment likely off the lesser trochanter seen as well. Patient
had a varus angulated basicervical fracture on 06/24/2015 which was
fixed with a right-sided intra medullary femoral nail. There is a
subtle lucency involving the right inferior pubic ramus as well
without displacement.
IMPRESSION: There appears to been some settling of the basicervical femoral neck
fracture along its medial aspect since fixing with an intramedullary
nail, resulting in slight varus angulation since the intraoperative
study. This is not believed to represent an acute abnormality.

There is however subtle cortical irregularity involving the right
inferior pubic ramus near the ischium suspicious for nondisplaced
fracture.

## 2017-03-28 IMAGING — CT CT T SPINE W/O CM
3 of 4 series · 11 of 33 positions shown, 13 images · non-contrast
Comparison: Chest radiograph November 29, 2015 and March 04, 2015

CLINICAL DATA: Fell out of wheelchair, followup thoracic spine
fracture seen on lumbar spine radiographs. History of Alzheimer's
disease.

EXAM:
CT THORACIC SPINE WITHOUT CONTRAST
TECHNIQUE: Multidetector CT imaging of the thoracic spine was performed without
intravenous contrast administration. Multiplanar CT image
reconstructions were also generated.

[Series 4: t spine soft · axial · 0.37mm/px · z∈[+1025,+1189]mm · 3 of 166 slices shown, 4 images]
[im 42/166  soft-tissue]
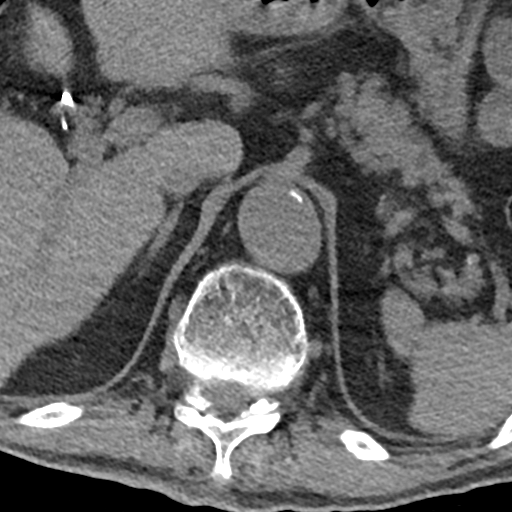
[im 42/166  bone]
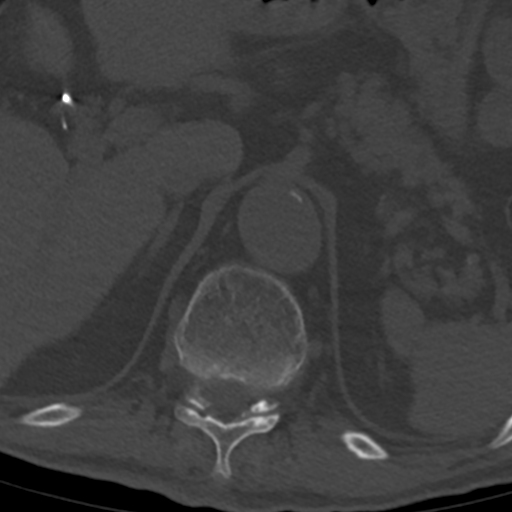
[im 83/166  bone]
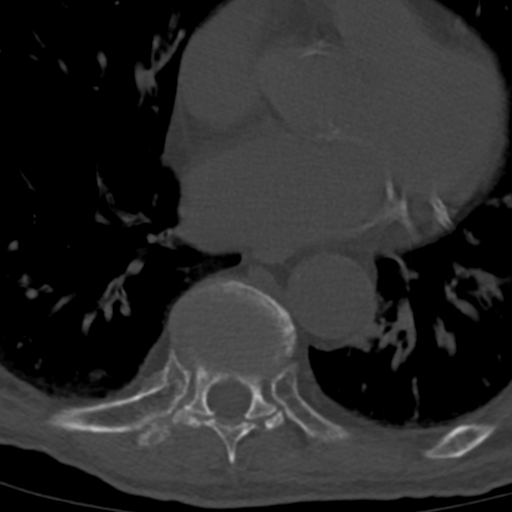
[im 124/166  bone]
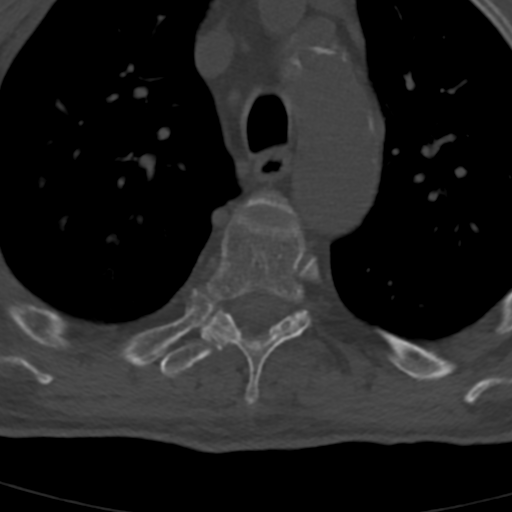

[Series 5: sagittal bone · sagittal · 0.33mm/px · 5 of 61 slices shown, 6 images]
[im 21/61  bone]
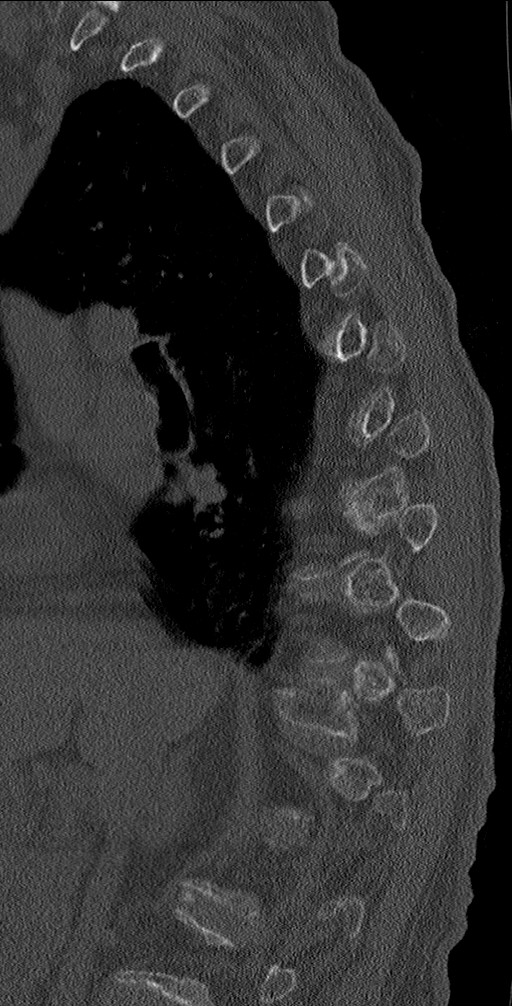
[im 26/61  bone]
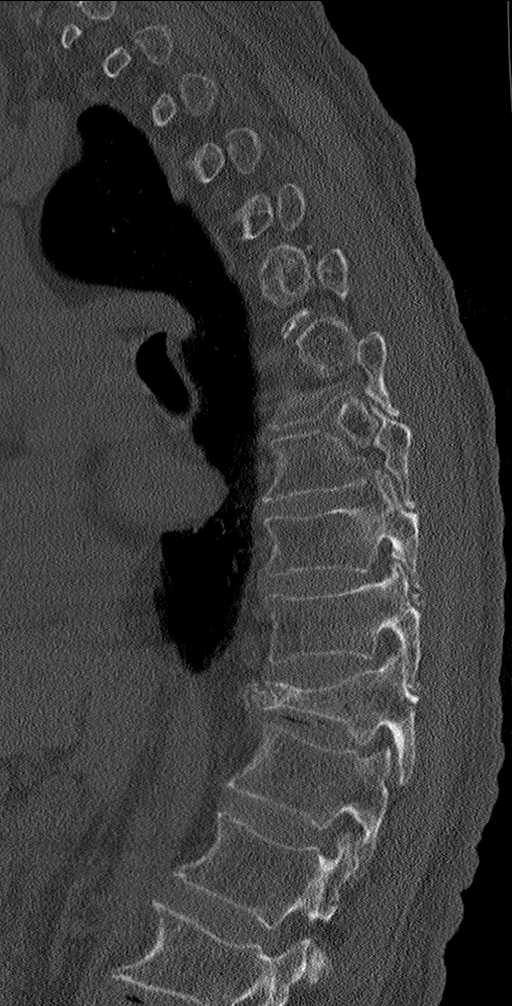
[im 31/61  soft-tissue]
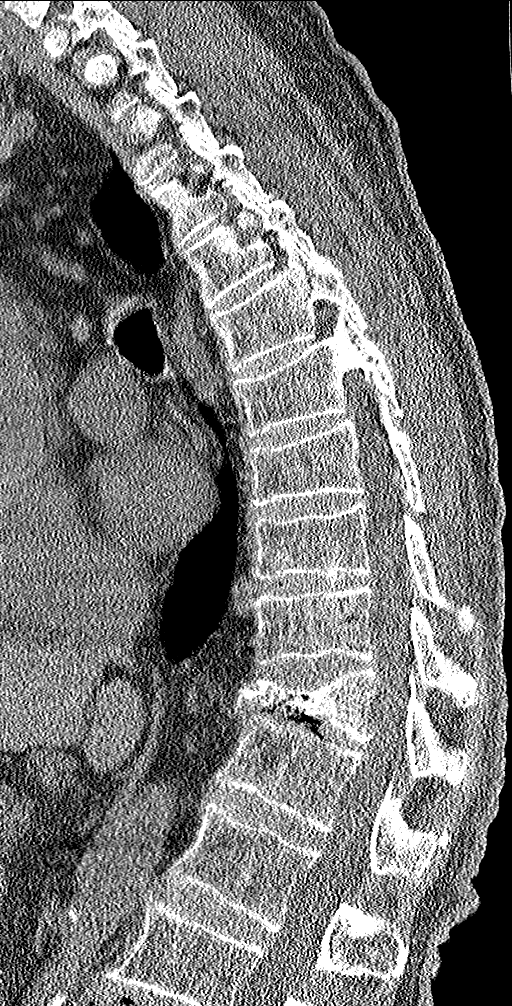
[im 31/61  bone]
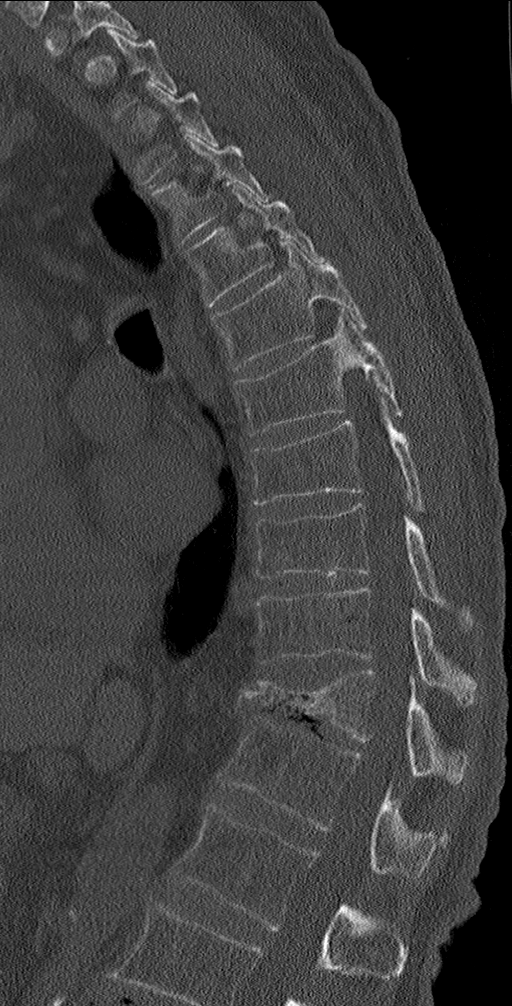
[im 36/61  bone]
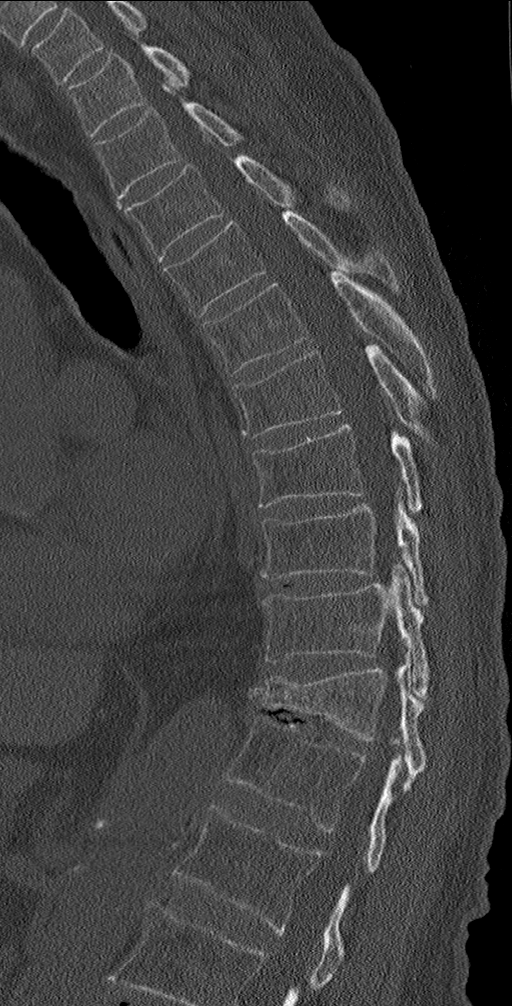
[im 41/61  bone]
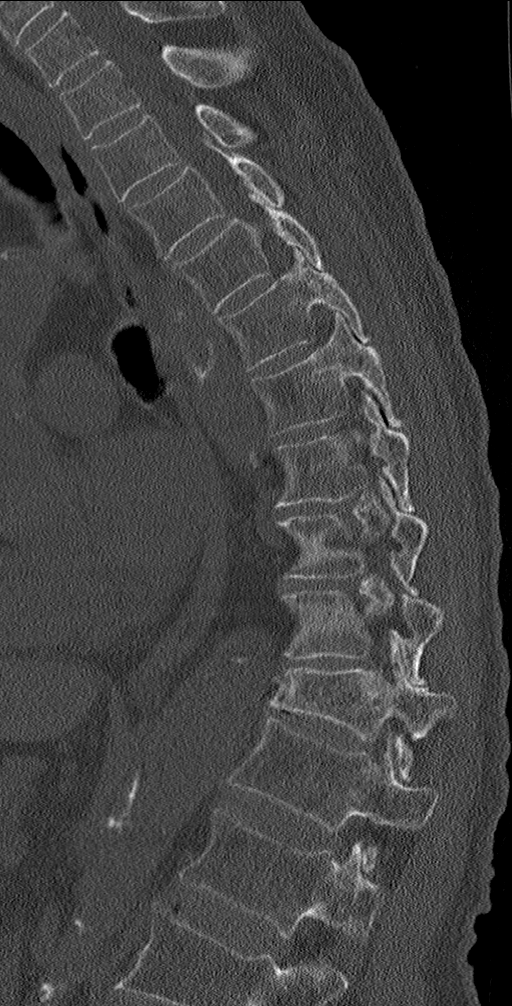

[Series 6: coronal bone · coronal · 0.49mm/px · 3 of 84 slices shown]
[im 17/84  bone]
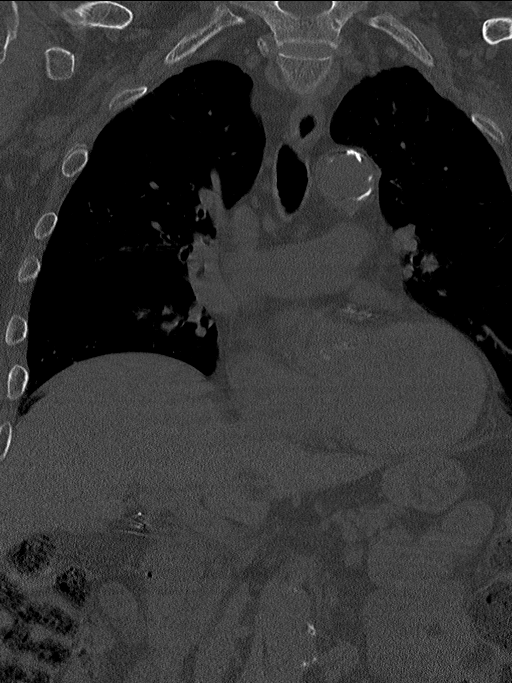
[im 34/84  bone]
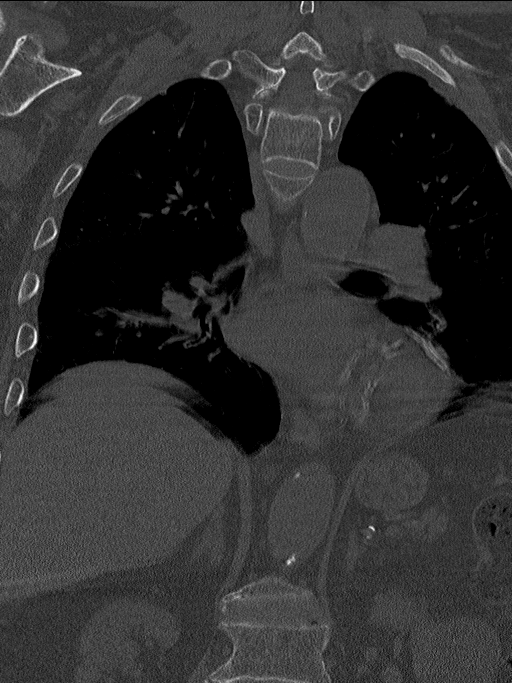
[im 50/84  bone]
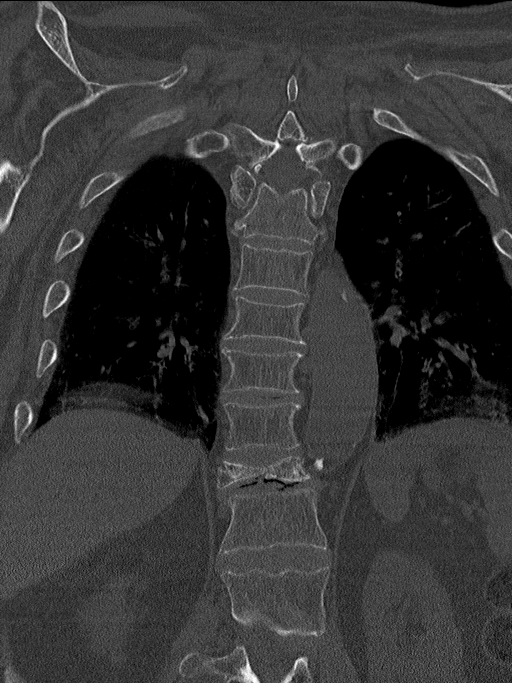

[11 of 33 positions shown; findings below may reference images not displayed]

FINDINGS: ALIGNMENT: Thoracic vertebral bodies are in alignment. Maintained
thoracic lordosis.

OSSEOUS STRUCTURES: Severe T11 compression fracture, similar height
loss to November 29, 2015 though, further height loss from March 04, 2015. Vacuum disc T11-12. The remaining vertebral bodies are intact.
Intervertebral disc heights generally preserved, scattered Schmorl's
nodes. Multilevel moderate facet arthropathy mid thoracic spine. No
destructive bony lesions. Osteopenia. Healing RIGHT posterior T11
rib fracture with callus. Developmentally unfused L1 transverse
process.

SOFT TISSUES: Moderate calcific atherosclerosis of the thoracic
aorta. Mild cardiomegaly incompletely characterized. Severe coronary
artery calcifications. Partially imaged LEFT 3.2 cm renal cyst.
Status post cholecystectomy.
IMPRESSION: Severe chronic appearing T11 compression fracture ; considering
degree of osteopenia this is likely an insufficiency fracture. No
malalignment.
